# Patient Record
Sex: Female | Born: 1950
Health system: Southern US, Community
[De-identification: ages and names within clinical notes are randomized; demographics above are authoritative.]

## PROBLEM LIST (undated history)

## (undated) DIAGNOSIS — E785 Hyperlipidemia, unspecified: Secondary | ICD-10-CM

## (undated) DIAGNOSIS — I10 Essential (primary) hypertension: Secondary | ICD-10-CM

## (undated) DIAGNOSIS — E538 Deficiency of other specified B group vitamins: Secondary | ICD-10-CM

## (undated) DIAGNOSIS — F329 Major depressive disorder, single episode, unspecified: Secondary | ICD-10-CM

## (undated) DIAGNOSIS — Z9289 Personal history of other medical treatment: Secondary | ICD-10-CM

## (undated) DIAGNOSIS — E669 Obesity, unspecified: Secondary | ICD-10-CM

## (undated) DIAGNOSIS — F419 Anxiety disorder, unspecified: Secondary | ICD-10-CM

## (undated) DIAGNOSIS — D649 Anemia, unspecified: Secondary | ICD-10-CM

## (undated) DIAGNOSIS — K5790 Diverticulosis of intestine, part unspecified, without perforation or abscess without bleeding: Secondary | ICD-10-CM

## (undated) DIAGNOSIS — K625 Hemorrhage of anus and rectum: Secondary | ICD-10-CM

## (undated) DIAGNOSIS — R011 Cardiac murmur, unspecified: Secondary | ICD-10-CM

## (undated) DIAGNOSIS — F32A Depression, unspecified: Secondary | ICD-10-CM

## (undated) DIAGNOSIS — M1990 Unspecified osteoarthritis, unspecified site: Secondary | ICD-10-CM

## (undated) DIAGNOSIS — T7840XA Allergy, unspecified, initial encounter: Secondary | ICD-10-CM

## (undated) DIAGNOSIS — K219 Gastro-esophageal reflux disease without esophagitis: Secondary | ICD-10-CM

## (undated) DIAGNOSIS — E559 Vitamin D deficiency, unspecified: Secondary | ICD-10-CM

## (undated) DIAGNOSIS — R42 Dizziness and giddiness: Secondary | ICD-10-CM

## (undated) DIAGNOSIS — J3089 Other allergic rhinitis: Secondary | ICD-10-CM

## (undated) HISTORY — DX: Obesity, unspecified: E66.9

## (undated) HISTORY — DX: Hemorrhage of anus and rectum: K62.5

## (undated) HISTORY — DX: Essential (primary) hypertension: I10

## (undated) HISTORY — DX: Allergy, unspecified, initial encounter: T78.40XA

## (undated) HISTORY — DX: Deficiency of other specified B group vitamins: E53.8

## (undated) HISTORY — DX: Cardiac murmur, unspecified: R01.1

## (undated) HISTORY — PX: ABDOMINAL HYSTERECTOMY: SHX81

## (undated) HISTORY — DX: Dizziness and giddiness: R42

## (undated) HISTORY — DX: Personal history of other medical treatment: Z92.89

## (undated) HISTORY — DX: Anemia, unspecified: D64.9

## (undated) HISTORY — DX: Gastro-esophageal reflux disease without esophagitis: K21.9

## (undated) HISTORY — DX: Depression, unspecified: F32.A

## (undated) HISTORY — DX: Anxiety disorder, unspecified: F41.9

## (undated) HISTORY — DX: Vitamin D deficiency, unspecified: E55.9

## (undated) HISTORY — DX: Diverticulosis of intestine, part unspecified, without perforation or abscess without bleeding: K57.90

## (undated) HISTORY — DX: Other allergic rhinitis: J30.89

## (undated) HISTORY — DX: Hyperlipidemia, unspecified: E78.5

## (undated) HISTORY — DX: Unspecified osteoarthritis, unspecified site: M19.90

## (undated) HISTORY — DX: Major depressive disorder, single episode, unspecified: F32.9

## (undated) HISTORY — PX: NO PAST SURGERIES: SHX2092

---

## 1998-02-05 ENCOUNTER — Encounter: Admission: RE | Admit: 1998-02-05 | Discharge: 1998-02-05 | Payer: Self-pay | Admitting: Family Medicine

## 1998-03-06 ENCOUNTER — Encounter: Admission: RE | Admit: 1998-03-06 | Discharge: 1998-03-06 | Payer: Self-pay | Admitting: Family Medicine

## 1998-03-18 ENCOUNTER — Encounter: Admission: RE | Admit: 1998-03-18 | Discharge: 1998-03-18 | Payer: Self-pay | Admitting: Family Medicine

## 1998-04-01 ENCOUNTER — Encounter: Admission: RE | Admit: 1998-04-01 | Discharge: 1998-04-01 | Payer: Self-pay | Admitting: Sports Medicine

## 1998-04-15 ENCOUNTER — Encounter: Admission: RE | Admit: 1998-04-15 | Discharge: 1998-04-15 | Payer: Self-pay | Admitting: Family Medicine

## 1998-08-08 ENCOUNTER — Encounter: Admission: RE | Admit: 1998-08-08 | Discharge: 1998-08-08 | Payer: Self-pay | Admitting: Family Medicine

## 1999-01-22 ENCOUNTER — Encounter: Admission: RE | Admit: 1999-01-22 | Discharge: 1999-01-22 | Payer: Self-pay | Admitting: Family Medicine

## 1999-02-20 ENCOUNTER — Encounter: Admission: RE | Admit: 1999-02-20 | Discharge: 1999-02-20 | Payer: Self-pay | Admitting: Family Medicine

## 1999-04-27 ENCOUNTER — Encounter: Payer: Self-pay | Admitting: Sports Medicine

## 1999-04-27 ENCOUNTER — Encounter: Admission: RE | Admit: 1999-04-27 | Discharge: 1999-04-27 | Payer: Self-pay | Admitting: Sports Medicine

## 1999-04-30 ENCOUNTER — Encounter: Admission: RE | Admit: 1999-04-30 | Discharge: 1999-04-30 | Payer: Self-pay | Admitting: Sports Medicine

## 1999-04-30 ENCOUNTER — Encounter: Payer: Self-pay | Admitting: Sports Medicine

## 1999-06-02 ENCOUNTER — Encounter: Admission: RE | Admit: 1999-06-02 | Discharge: 1999-06-02 | Payer: Self-pay | Admitting: Family Medicine

## 1999-10-08 ENCOUNTER — Encounter: Admission: RE | Admit: 1999-10-08 | Discharge: 1999-10-08 | Payer: Self-pay | Admitting: Family Medicine

## 2000-02-18 ENCOUNTER — Encounter: Admission: RE | Admit: 2000-02-18 | Discharge: 2000-02-18 | Payer: Self-pay

## 2000-03-23 ENCOUNTER — Encounter: Admission: RE | Admit: 2000-03-23 | Discharge: 2000-03-23 | Payer: Self-pay | Admitting: Family Medicine

## 2000-04-05 ENCOUNTER — Encounter: Admission: RE | Admit: 2000-04-05 | Discharge: 2000-04-05 | Payer: Self-pay | Admitting: Family Medicine

## 2000-05-31 ENCOUNTER — Encounter: Admission: RE | Admit: 2000-05-31 | Discharge: 2000-05-31 | Payer: Self-pay | Admitting: Sports Medicine

## 2000-05-31 ENCOUNTER — Encounter: Payer: Self-pay | Admitting: Sports Medicine

## 2000-08-08 ENCOUNTER — Encounter: Admission: RE | Admit: 2000-08-08 | Discharge: 2000-08-08 | Payer: Self-pay | Admitting: Family Medicine

## 2001-03-20 ENCOUNTER — Encounter: Admission: RE | Admit: 2001-03-20 | Discharge: 2001-03-20 | Payer: Self-pay | Admitting: Family Medicine

## 2001-06-26 ENCOUNTER — Encounter: Admission: RE | Admit: 2001-06-26 | Discharge: 2001-06-26 | Payer: Self-pay | Admitting: Family Medicine

## 2001-08-09 ENCOUNTER — Encounter: Admission: RE | Admit: 2001-08-09 | Discharge: 2001-08-09 | Payer: Self-pay | Admitting: Sports Medicine

## 2001-08-09 ENCOUNTER — Encounter: Payer: Self-pay | Admitting: Sports Medicine

## 2001-08-18 ENCOUNTER — Encounter (INDEPENDENT_AMBULATORY_CARE_PROVIDER_SITE_OTHER): Payer: Self-pay | Admitting: *Deleted

## 2001-08-18 LAB — CONVERTED CEMR LAB

## 2001-09-08 ENCOUNTER — Encounter: Admission: RE | Admit: 2001-09-08 | Discharge: 2001-09-08 | Payer: Self-pay | Admitting: Family Medicine

## 2002-03-23 ENCOUNTER — Encounter: Admission: RE | Admit: 2002-03-23 | Discharge: 2002-03-23 | Payer: Self-pay | Admitting: Family Medicine

## 2002-08-17 ENCOUNTER — Encounter: Admission: RE | Admit: 2002-08-17 | Discharge: 2002-08-17 | Payer: Self-pay | Admitting: Family Medicine

## 2002-12-05 ENCOUNTER — Encounter: Admission: RE | Admit: 2002-12-05 | Discharge: 2002-12-05 | Payer: Self-pay | Admitting: Family Medicine

## 2003-01-10 ENCOUNTER — Encounter: Admission: RE | Admit: 2003-01-10 | Discharge: 2003-01-10 | Payer: Self-pay | Admitting: Family Medicine

## 2003-01-16 ENCOUNTER — Encounter: Admission: RE | Admit: 2003-01-16 | Discharge: 2003-01-16 | Payer: Self-pay | Admitting: Sports Medicine

## 2003-05-29 ENCOUNTER — Encounter: Admission: RE | Admit: 2003-05-29 | Discharge: 2003-05-29 | Payer: Self-pay | Admitting: Family Medicine

## 2003-05-30 ENCOUNTER — Encounter: Admission: RE | Admit: 2003-05-30 | Discharge: 2003-05-30 | Payer: Self-pay | Admitting: Sports Medicine

## 2003-08-27 ENCOUNTER — Encounter: Admission: RE | Admit: 2003-08-27 | Discharge: 2003-08-27 | Payer: Self-pay | Admitting: Family Medicine

## 2003-10-22 ENCOUNTER — Ambulatory Visit: Payer: Self-pay | Admitting: Sports Medicine

## 2003-11-19 ENCOUNTER — Ambulatory Visit: Payer: Self-pay | Admitting: Sports Medicine

## 2003-12-24 ENCOUNTER — Ambulatory Visit: Payer: Self-pay | Admitting: Sports Medicine

## 2004-01-29 ENCOUNTER — Emergency Department (HOSPITAL_COMMUNITY): Admission: EM | Admit: 2004-01-29 | Discharge: 2004-01-29 | Payer: Self-pay | Admitting: Family Medicine

## 2004-03-17 ENCOUNTER — Encounter: Admission: RE | Admit: 2004-03-17 | Discharge: 2004-03-17 | Payer: Self-pay | Admitting: Sports Medicine

## 2004-04-17 ENCOUNTER — Ambulatory Visit: Payer: Self-pay | Admitting: Family Medicine

## 2004-04-27 ENCOUNTER — Ambulatory Visit: Payer: Self-pay | Admitting: Family Medicine

## 2004-05-15 ENCOUNTER — Ambulatory Visit: Payer: Self-pay | Admitting: Family Medicine

## 2004-10-12 ENCOUNTER — Ambulatory Visit: Payer: Self-pay | Admitting: Sports Medicine

## 2004-11-11 ENCOUNTER — Ambulatory Visit: Payer: Self-pay | Admitting: Family Medicine

## 2005-03-19 ENCOUNTER — Ambulatory Visit: Payer: Self-pay | Admitting: Family Medicine

## 2005-04-09 ENCOUNTER — Encounter: Admission: RE | Admit: 2005-04-09 | Discharge: 2005-04-09 | Payer: Self-pay | Admitting: Sports Medicine

## 2005-04-21 ENCOUNTER — Ambulatory Visit: Payer: Self-pay | Admitting: Family Medicine

## 2005-05-07 ENCOUNTER — Ambulatory Visit: Payer: Self-pay | Admitting: Family Medicine

## 2005-05-27 ENCOUNTER — Ambulatory Visit: Payer: Self-pay | Admitting: Family Medicine

## 2005-07-06 ENCOUNTER — Ambulatory Visit: Payer: Self-pay | Admitting: Sports Medicine

## 2005-11-26 ENCOUNTER — Ambulatory Visit: Payer: Self-pay | Admitting: Family Medicine

## 2006-01-27 ENCOUNTER — Ambulatory Visit: Payer: Self-pay | Admitting: Family Medicine

## 2006-02-01 ENCOUNTER — Ambulatory Visit: Payer: Self-pay | Admitting: Sports Medicine

## 2006-03-17 DIAGNOSIS — K219 Gastro-esophageal reflux disease without esophagitis: Secondary | ICD-10-CM | POA: Insufficient documentation

## 2006-03-17 DIAGNOSIS — E785 Hyperlipidemia, unspecified: Secondary | ICD-10-CM | POA: Insufficient documentation

## 2006-03-18 ENCOUNTER — Encounter (INDEPENDENT_AMBULATORY_CARE_PROVIDER_SITE_OTHER): Payer: Self-pay | Admitting: *Deleted

## 2006-04-28 ENCOUNTER — Ambulatory Visit: Payer: Self-pay | Admitting: Family Medicine

## 2006-05-09 ENCOUNTER — Encounter (INDEPENDENT_AMBULATORY_CARE_PROVIDER_SITE_OTHER): Payer: Self-pay | Admitting: Family Medicine

## 2006-05-09 ENCOUNTER — Encounter: Admission: RE | Admit: 2006-05-09 | Discharge: 2006-05-09 | Payer: Self-pay | Admitting: Sports Medicine

## 2006-05-26 ENCOUNTER — Ambulatory Visit: Payer: Self-pay | Admitting: Family Medicine

## 2006-05-26 ENCOUNTER — Encounter (INDEPENDENT_AMBULATORY_CARE_PROVIDER_SITE_OTHER): Payer: Self-pay | Admitting: Family Medicine

## 2006-05-26 DIAGNOSIS — K649 Unspecified hemorrhoids: Secondary | ICD-10-CM | POA: Insufficient documentation

## 2006-05-26 DIAGNOSIS — F32A Depression, unspecified: Secondary | ICD-10-CM | POA: Insufficient documentation

## 2006-05-26 DIAGNOSIS — E669 Obesity, unspecified: Secondary | ICD-10-CM | POA: Insufficient documentation

## 2006-05-26 DIAGNOSIS — F329 Major depressive disorder, single episode, unspecified: Secondary | ICD-10-CM

## 2006-05-26 DIAGNOSIS — I10 Essential (primary) hypertension: Secondary | ICD-10-CM | POA: Insufficient documentation

## 2006-05-26 DIAGNOSIS — K5732 Diverticulitis of large intestine without perforation or abscess without bleeding: Secondary | ICD-10-CM | POA: Insufficient documentation

## 2006-05-26 DIAGNOSIS — F419 Anxiety disorder, unspecified: Secondary | ICD-10-CM

## 2006-05-31 ENCOUNTER — Encounter (INDEPENDENT_AMBULATORY_CARE_PROVIDER_SITE_OTHER): Payer: Self-pay | Admitting: Family Medicine

## 2006-05-31 ENCOUNTER — Ambulatory Visit: Payer: Self-pay | Admitting: Family Medicine

## 2006-05-31 LAB — CONVERTED CEMR LAB
ALT: 21 units/L (ref 0–35)
AST: 20 units/L (ref 0–37)
Albumin: 4.1 g/dL (ref 3.5–5.2)
Alkaline Phosphatase: 96 units/L (ref 39–117)
BUN: 14 mg/dL (ref 6–23)
CO2: 29 meq/L (ref 19–32)
Calcium: 9.6 mg/dL (ref 8.4–10.5)
Chloride: 99 meq/L (ref 96–112)
Cholesterol: 223 mg/dL — ABNORMAL HIGH (ref 0–200)
Creatinine, Ser: 0.76 mg/dL (ref 0.40–1.20)
Glucose, Bld: 108 mg/dL — ABNORMAL HIGH (ref 70–99)
HDL: 53 mg/dL (ref >39)
LDL Cholesterol: 151 mg/dL — ABNORMAL HIGH (ref 0–99)
Potassium: 4.1 meq/L (ref 3.5–5.3)
Sodium: 138 meq/L (ref 135–145)
TSH: 1.041 microintl units/mL (ref 0.350–5.50)
Total Bilirubin: 0.4 mg/dL (ref 0.3–1.2)
Total CHOL/HDL Ratio: 4.2
Total Protein: 7.6 g/dL (ref 6.0–8.3)
Triglycerides: 96 mg/dL (ref <150)
VLDL: 19 mg/dL (ref 0–40)

## 2006-06-01 ENCOUNTER — Encounter (INDEPENDENT_AMBULATORY_CARE_PROVIDER_SITE_OTHER): Payer: Self-pay | Admitting: Family Medicine

## 2007-01-06 ENCOUNTER — Ambulatory Visit: Payer: Self-pay | Admitting: Family Medicine

## 2007-05-15 ENCOUNTER — Ambulatory Visit: Payer: Self-pay | Admitting: Family Medicine

## 2007-12-19 ENCOUNTER — Telehealth (INDEPENDENT_AMBULATORY_CARE_PROVIDER_SITE_OTHER): Payer: Self-pay | Admitting: *Deleted

## 2007-12-20 ENCOUNTER — Encounter (INDEPENDENT_AMBULATORY_CARE_PROVIDER_SITE_OTHER): Payer: Self-pay | Admitting: Family Medicine

## 2007-12-20 ENCOUNTER — Ambulatory Visit: Payer: Self-pay | Admitting: Family Medicine

## 2007-12-20 LAB — CONVERTED CEMR LAB
ALT: 11 units/L (ref 0–35)
AST: 15 units/L (ref 0–37)
Albumin: 4.3 g/dL (ref 3.5–5.2)
Alkaline Phosphatase: 93 units/L (ref 39–117)
BUN: 14 mg/dL (ref 6–23)
CO2: 28 meq/L (ref 19–32)
Calcium: 9.8 mg/dL (ref 8.4–10.5)
Chloride: 96 meq/L (ref 96–112)
Cholesterol: 219 mg/dL — ABNORMAL HIGH (ref 0–200)
Creatinine, Ser: 0.86 mg/dL (ref 0.40–1.20)
Glucose, Bld: 127 mg/dL — ABNORMAL HIGH (ref 70–99)
HDL: 56 mg/dL (ref >39)
LDL Cholesterol: 143 mg/dL — ABNORMAL HIGH (ref 0–99)
Potassium: 3.5 meq/L (ref 3.5–5.3)
Sodium: 136 meq/L (ref 135–145)
Total Bilirubin: 0.3 mg/dL (ref 0.3–1.2)
Total CHOL/HDL Ratio: 3.9
Total Protein: 8.1 g/dL (ref 6.0–8.3)
Triglycerides: 101 mg/dL (ref <150)
VLDL: 20 mg/dL (ref 0–40)

## 2007-12-26 ENCOUNTER — Encounter (INDEPENDENT_AMBULATORY_CARE_PROVIDER_SITE_OTHER): Payer: Self-pay | Admitting: Family Medicine

## 2008-07-17 ENCOUNTER — Telehealth: Payer: Self-pay | Admitting: Family Medicine

## 2008-07-29 ENCOUNTER — Encounter: Payer: Self-pay | Admitting: Family Medicine

## 2008-08-08 ENCOUNTER — Ambulatory Visit: Payer: Self-pay | Admitting: Family Medicine

## 2008-09-24 ENCOUNTER — Telehealth: Payer: Self-pay | Admitting: Family Medicine

## 2008-09-25 ENCOUNTER — Telehealth: Payer: Self-pay | Admitting: Family Medicine

## 2008-10-23 ENCOUNTER — Ambulatory Visit (HOSPITAL_COMMUNITY): Admission: RE | Admit: 2008-10-23 | Discharge: 2008-10-23 | Payer: Self-pay | Admitting: Family Medicine

## 2009-03-03 ENCOUNTER — Telehealth: Payer: Self-pay | Admitting: Family Medicine

## 2009-03-24 ENCOUNTER — Ambulatory Visit: Payer: Self-pay | Admitting: Family Medicine

## 2009-03-24 DIAGNOSIS — M1990 Unspecified osteoarthritis, unspecified site: Secondary | ICD-10-CM | POA: Insufficient documentation

## 2009-04-17 ENCOUNTER — Ambulatory Visit: Payer: Self-pay | Admitting: Family Medicine

## 2009-04-17 ENCOUNTER — Encounter: Payer: Self-pay | Admitting: Family Medicine

## 2009-04-17 LAB — CONVERTED CEMR LAB
ALT: 12 units/L (ref 0–35)
AST: 15 units/L (ref 0–37)
Albumin: 4 g/dL (ref 3.5–5.2)
Alkaline Phosphatase: 87 units/L (ref 39–117)
BUN: 15 mg/dL (ref 6–23)
CO2: 27 meq/L (ref 19–32)
CRP: 1.1 mg/dL — ABNORMAL HIGH (ref <0.6)
Calcium: 9.4 mg/dL (ref 8.4–10.5)
Chloride: 99 meq/L (ref 96–112)
Cholesterol: 224 mg/dL — ABNORMAL HIGH (ref 0–200)
Creatinine, Ser: 0.89 mg/dL (ref 0.40–1.20)
Glucose, Bld: 107 mg/dL — ABNORMAL HIGH (ref 70–99)
HDL: 38 mg/dL — ABNORMAL LOW (ref >39)
LDL Cholesterol: 161 mg/dL — ABNORMAL HIGH (ref 0–99)
Potassium: 3.9 meq/L (ref 3.5–5.3)
Rhuematoid fact SerPl-aCnc: 28 intl units/mL — ABNORMAL HIGH (ref 0–20)
Sodium: 137 meq/L (ref 135–145)
Total Bilirubin: 0.3 mg/dL (ref 0.3–1.2)
Total CHOL/HDL Ratio: 5.9
Total Protein: 7.6 g/dL (ref 6.0–8.3)
Triglycerides: 124 mg/dL (ref <150)
VLDL: 25 mg/dL (ref 0–40)
Vit D, 25-Hydroxy: <10 ng/mL — ABNORMAL LOW (ref 30–89)

## 2009-05-05 ENCOUNTER — Encounter: Payer: Self-pay | Admitting: Family Medicine

## 2009-05-05 ENCOUNTER — Telehealth: Payer: Self-pay | Admitting: *Deleted

## 2009-05-08 ENCOUNTER — Ambulatory Visit (HOSPITAL_COMMUNITY): Admission: RE | Admit: 2009-05-08 | Discharge: 2009-05-08 | Payer: Self-pay | Admitting: Family Medicine

## 2009-05-13 ENCOUNTER — Ambulatory Visit: Payer: Self-pay | Admitting: Family Medicine

## 2009-05-13 ENCOUNTER — Encounter: Payer: Self-pay | Admitting: Family Medicine

## 2009-05-13 LAB — CONVERTED CEMR LAB
Cyclic Citrullin Peptide Ab: <2 units (ref 0–5)
HCT: 35 % — ABNORMAL LOW (ref 36.0–46.0)
Hemoglobin: 10.7 g/dL — ABNORMAL LOW (ref 12.0–15.0)
MCHC: 30.6 g/dL (ref 30.0–36.0)
MCV: 86.6 fL (ref 78.0–100.0)
Platelets: 322 10*3/uL (ref 150–400)
RBC: 4.04 M/uL (ref 3.87–5.11)
RDW: 14.6 % (ref 11.5–15.5)
WBC: 6.2 10*3/uL (ref 4.0–10.5)

## 2009-05-20 ENCOUNTER — Encounter: Payer: Self-pay | Admitting: Family Medicine

## 2009-05-20 ENCOUNTER — Ambulatory Visit: Payer: Self-pay | Admitting: Family Medicine

## 2009-05-20 DIAGNOSIS — D510 Vitamin B12 deficiency anemia due to intrinsic factor deficiency: Secondary | ICD-10-CM | POA: Insufficient documentation

## 2009-05-20 LAB — CONVERTED CEMR LAB: Hgb A1c MFr Bld: 6.3 %

## 2009-10-27 ENCOUNTER — Ambulatory Visit: Payer: Self-pay | Admitting: Family Medicine

## 2009-10-27 ENCOUNTER — Encounter: Payer: Self-pay | Admitting: Psychology

## 2009-10-27 DIAGNOSIS — J309 Allergic rhinitis, unspecified: Secondary | ICD-10-CM | POA: Insufficient documentation

## 2009-11-10 ENCOUNTER — Ambulatory Visit: Payer: Self-pay | Admitting: Psychology

## 2009-11-26 ENCOUNTER — Ambulatory Visit: Payer: Self-pay | Admitting: Psychology

## 2009-12-26 ENCOUNTER — Telehealth: Payer: Self-pay | Admitting: Family Medicine

## 2010-02-17 NOTE — Assessment & Plan Note (Signed)
Summary: Behavioral Medicine Student Consultation   Primary Care Provider:  Helane Rima DO   History of Present Illness: Ms. Forbush has a history of depression. She reported increased stressors over the last several weeks. Specifically, Ms. Holtrop reported that she has been unable to find a job and has become hopeless. Her PCP, Dr. Earlene Plater, requested student behavioral medicine consultaton to discuss therapy options.   Allergies: No Known Drug Allergies   Impression & Recommendations:  Problem # 1:  DEPRESSION (ICD-311) Ms. Boorman is interested in seeing a psychologist to discuss her recent stressors and feelings of hopelessness. Discussed options for therapy with Ms. Goren including seeing Dr. Pascal Lux, the [facility], and the [facility] [place] site for searching for providers in the [place] area. Ms. Bahri was concerned about financial commitments in regard to therapy.  Ms. Halabi may benefit from continued follow-up about therapy. Specifically, she may benefit form reinforcement for attending therapy or dicsussion of the barriers that prevented her from contating a psychologist.   Delphia Grates  Behavioral Medicine Student  October 27, 2009 9:45 AM   Her updated medication list for this problem includes:    Paxil 20 Mg Tabs (Paroxetine hcl) .Marland Kitchen... Take 2 by mouth daily  Complete Medication List: 1)  Lisinopril 20 Mg Tabs (Lisinopril) .Marland Kitchen.. 1 tablet by mouth daily for blood pressure 2)  Hydrochlorothiazide 25 Mg Tabs (Hydrochlorothiazide) .Marland Kitchen.. 1 tablet by mouth daily 3)  Paxil 20 Mg Tabs (Paroxetine hcl) .... Take 2 by mouth daily 4)  Simvastatin 40 Mg Tabs (Simvastatin) .... Take one tablet at bedtime 5)  Aleve 220 Mg Caps (Naproxen sodium) .... One by mouth two times a day three times a day as needed pain 6)  Hydrocodone-acetaminophen 5-325 Mg Tabs (Hydrocodone-acetaminophen) .Marland Kitchen.. 1 by mouth up to 4 times per day as needed for severe pain 7)  Ferrous  Sulfate 325 (65 Fe) Mg Tbec (Ferrous sulfate) .... Two times a day 8)  Vitamin D (ergocalciferol) 50000 Unit Caps (Ergocalciferol) .... One by mouth weekly x 12 weeks 9)  Zyrtec Allergy 10 Mg Caps (Cetirizine hcl) .... One by mouth daily 10)  Fluticasone Propionate 50 Mcg/act Susp (Fluticasone propionate) .... 2 sprays each nostril once daily  Appended Document: Behavioral Medicine Student Consultation Ms. Burrows called to schedule an appt.  She preferred morning.  We scheduled for the first available:  October 24th at 11:00.

## 2010-02-17 NOTE — Miscellaneous (Signed)
Summary: waiting for pt to call back/ts   Clinical Lists Changes  Orders: Added new Test order of [facility] (69485) - Signed Added new Test order of [facility] [id number]) - Signed  Please call this patient and ask her to:  1. Get the xrays that I ordered. 2. Come in for above labs. 3. Make an appointment to discuss these.  Helane Rima DO  May 05, 2009 11:23 AM   CALLED PT AND LMAM TO CALL BACK.Arlyss Repress CMA,  May 05, 2009 11:28 AM

## 2010-02-17 NOTE — Assessment & Plan Note (Signed)
Summary: med check/dlh   Vital Signs:  Patient profile:   60 year old female Height:      64.75 inches Weight:      219 pounds BMI:     36.86 Pulse rate:   100 / minute BP sitting:   130 / 80  (right arm) Cuff size:   large  Vitals Entered By: Arlyss Repress CMA, (August 08, 2008 2:54 PM) CC: f/up on HTN and refill meds. pt has not been working x 2 years. some anxiety. Is Patient Diabetic? No Pain Assessment Patient in pain? no        Primary Care Provider:  Helane Rima DO  CC:  f/up on HTN and refill meds. pt has not been working x 2 years. some anxiety.Marland Kitchen  History of Present Illness: Jasmine Gregory is a 60 year old lady presenting for refill on medications, HTN, and depression/anxiety.  1. HTN: Rx lisinopril/HCTZ. she denies CP, SOB, palpitations, N/V/D, HA, dizziness.  2. Depression/Anxiety: was recently started on Paxil 20 mg daily. still without job, on [employer].  finances are tight.  she is bored at home all day with nothing to do.  divorced.  has sister and dtr she talks with frequently for support. no SI/HI.  Habits & Providers  Alcohol-Tobacco-Diet     Tobacco Status: never  Current Medications (verified): 1)  Lisinopril 20 Mg Tabs (Lisinopril) .Marland Kitchen.. 1 Tablet By Mouth Daily For Blood Pressure 2)  Hydrochlorothiazide 25 Mg  Tabs (Hydrochlorothiazide) .Marland Kitchen.. 1 Tablet By Mouth Daily 3)  Paxil 40 Mg Tabs (Paroxetine Hcl) .... Take One By Mouth Daily 4)  Pravastatin Sodium 40 Mg  Tabs (Pravastatin Sodium) .Marland Kitchen.. 1 Tablet By Mouth At Bedtime For Cholesterol  Allergies (verified): No Known Drug Allergies PMH-FH-SH reviewed-no changes except otherwise noted  Review of Systems       per HPI, otherwise negative  Physical Exam  General:  alert, well-developed, well-nourished, and well-hydrated.   Lungs:  Normal respiratory effort, chest expands symmetrically. Lungs are clear to auscultation, no crackles or wheezes. Heart:  Normal rate and regular rhythm. S1 and S2  normal without gallop, murmur, click, rub or other extra sounds. Psych:  Normally interactive and good eye contact.  Tearful.    Impression & Recommendations:  Problem # 1:  HYPERTENSION, BENIGN ESSENTIAL (ICD-401.1) Assessment Improved  Continue current treatment. Her updated medication list for this problem includes:    Lisinopril 20 Mg Tabs (Lisinopril) .Marland Kitchen... 1 tablet by mouth daily for blood pressure    Hydrochlorothiazide 25 Mg Tabs (Hydrochlorothiazide) .Marland Kitchen... 1 tablet by mouth daily  Orders: [facility]- Est Level  3 (16109)  Problem # 2:  DEPRESSION (ICD-311) Assessment: Unchanged  Will increase Paxil today. Given RED FLAGS for seeking medical care. Follow up one month. Her updated medication list for this problem includes:    Paxil 40 Mg Tabs (Paroxetine hcl) .Marland Kitchen... Take one by mouth daily  Orders: [facility]- Est Level  3 (60454)  Complete Medication List: 1)  Lisinopril 20 Mg Tabs (Lisinopril) .Marland Kitchen.. 1 tablet by mouth daily for blood pressure 2)  Hydrochlorothiazide 25 Mg Tabs (Hydrochlorothiazide) .Marland Kitchen.. 1 tablet by mouth daily 3)  Paxil 40 Mg Tabs (Paroxetine hcl) .... Take one by mouth daily 4)  Pravastatin Sodium 40 Mg Tabs (Pravastatin sodium) .Marland Kitchen.. 1 tablet by mouth at bedtime for cholesterol  Patient Instructions: 1)  It was very nice to meet you today!  2)  I am increasing your Paxil. Continue to use your 20 mg tabs until they  run out: take 1 and 1/2 tabs daily x 1 week, then use your new prescription of 40 mg tabs. 3)  Let me know if you have any problems or questions. Prescriptions: PRAVASTATIN SODIUM 40 MG  TABS (PRAVASTATIN SODIUM) 1 tablet by mouth at bedtime for cholesterol  #90 x 1   Entered and Authorized by:   Helane Rima MD   Signed by:   Helane Rima MD on 08/08/2008   Method used:   Electronically to        [facility] [phone]* (retail)       [address]       [place], [place]  [address]       Ph: [phone]       Fax: [phone]   RxID:    [id number] PAXIL 40 MG TABS (PAROXETINE HCL) take one by mouth daily  #90 x 3   Entered and Authorized by:   Helane Rima MD   Signed by:   Helane Rima MD on 08/08/2008   Method used:   Electronically to        [facility] [phone]* (retail)       [address]       [place], [place]  [address]       Ph: [phone]       Fax: [phone]   RxID:   [id number] HYDROCHLOROTHIAZIDE 25 MG  TABS (HYDROCHLOROTHIAZIDE) 1 tablet by mouth daily  #90 x 3   Entered and Authorized by:   Helane Rima MD   Signed by:   Helane Rima MD on 08/08/2008   Method used:   Electronically to        [facility] [phone]* (retail)       [address]       [place], [place]  [address]       Ph: [phone]       Fax: [phone]   RxID:   [id number] LISINOPRIL 20 MG TABS (LISINOPRIL) 1 tablet by mouth daily for blood pressure  #90 x 3   Entered and Authorized by:   Helane Rima MD   Signed by:   Helane Rima MD on 08/08/2008   Method used:   Electronically to        [facility] [phone]* (retail)       [address]       [place], [place]  [address]       Ph: [phone]       Fax: [phone]   RxID:   [id number] PAXIL 40 MG TABS (PAROXETINE HCL) take one by mouth daily  #30 x 3   Entered and Authorized by:   Helane Rima MD   Signed by:   Helane Rima MD on 08/08/2008   Method used:   Electronically to        [facility] [phone]* (retail)       [address]       [place], [place]  [address]       Ph: [phone]       Fax: [phone]   RxID:   [id number]

## 2010-02-17 NOTE — Assessment & Plan Note (Signed)
Summary: bp medication   Medications Added LISINOPRIL 10 MG  TABS (LISINOPRIL) 1 tablet by mouth daily HYDROCHLOROTHIAZIDE 25 MG  TABS (HYDROCHLOROTHIAZIDE) 1 tablet by mouth daily PAXIL 20 MG  TABS (PAROXETINE HCL) 1 tablet by mouth daily PRAVASTATIN SODIUM 40 MG  TABS (PRAVASTATIN SODIUM) 1 tablet by mouth at bedtime for cholesterol        Vital Signs:  Patient Profile:   60 Years Old Female Height:     64.75 inches Weight:      216.5 pounds Temp:     98.5 degrees F Pulse rate:   95 / minute BP sitting:   131 / 84  (right arm)  Pt. in pain?   no  Vitals Entered By: Theresia Lo RN (January 06, 2007 3:59 PM)              Is Patient Diabetic? No     Chief Complaint:  follow up on blood pressure and pt complains with pain in Rt foot intermittently.  History of Present Illness: 60 yo with HTN, HLD.  Here for routine f/u.  1.  Also complains of pain on the top part of her right foot x 1 month after tripping and twisting her ankle.  pain is mild and she is taking aleve as needed.  2.  also complains 1 year history of periodic episodes of feeling flush, overwhelmed with anxiety, almost like she is going to pass out.  Goes away after 5 minutes if she goes and sits down.  Not sure if she is having panic attacks.  Denies associated palpitations, sob, numbness/tingling, weakness.    3.  HTN- taking hctz and enalapril without side effects.  Denies CP, sob, palpitations.  4.  HLD- ran out of statin over 1 month ago, needs refill  5.  depression- taking paxil 20mg  every day now.  Was taking it every other day until she lost her job in May.  She has been really bored and anxious lately.  Still looking for a job and currently drawing unemployment.      Past Medical History:    fasting glucose 104 5/05    fasting glucose 108 5/08   Social History:    worked for [employer], layed off 05/2006.  Currently unemployed.    no tob, no EtOH    lives alone; adult dtr  temporarily stays with her    enjoys yard Airline pilot, movies, walking    has 1 dtr, 1 son (both grown)     Physical Exam  General:     NAD, pleasant Lungs:     Normal respiratory effort, chest expands symmetrically. Lungs are clear to auscultation, no crackles or wheezes. Heart:     Normal rate and regular rhythm. S1 and S2 normal without gallop, murmur, click, rub or other extra sounds. Msk:     no deformity on right foot, mildely ttp over dorsal aspect of foot, strength normal, normal gait Extremities:     no edema    Impression & Recommendations:  Problem # 1:  HYPERTENSION, BENIGN ESSENTIAL (ICD-401.1) well-controlled.  refill current meds Her updated medication list for this problem includes:    Lisinopril 10 Mg Tabs (Lisinopril) .Marland Kitchen... 1 tablet by mouth daily    Hydrochlorothiazide 25 Mg Tabs (Hydrochlorothiazide) .Marland Kitchen... 1 tablet by mouth daily   Problem # 2:  HYPERLIPIDEMIA (ICD-272.4) resume statin.  recheck FLP in 3 months. Her updated medication list for this problem includes:    Pravastatin Sodium 40 Mg Tabs (  Pravastatin sodium) .Marland Kitchen... 1 tablet by mouth at bedtime for cholesterol   Problem # 3:  DEPRESSION (ICD-311) ? whether she is having panic attacks.  Offered to increase her paxil, but she just recenlty started taking it on a daily basis and wants to see if this helps first.  Differential for these episodes of flushing and feeling faint also includes arrhythmia.  Offered holter monitoring but pt declined since she currently has no health insurance. Her updated medication list for this problem includes:    Paxil 20 Mg Tabs (Paroxetine hcl) .Marland Kitchen... 1 tablet by mouth daily   Problem # 4:  FOOT PAIN, RIGHT (ICD-729.5) normal exam.  aleve as needed.  Complete Medication List: 1)  Lisinopril 10 Mg Tabs (Lisinopril) .Marland Kitchen.. 1 tablet by mouth daily 2)  Hydrochlorothiazide 25 Mg Tabs (Hydrochlorothiazide) .Marland Kitchen.. 1 tablet by mouth daily 3)  Paxil 20 Mg Tabs (Paroxetine hcl)  .Marland Kitchen.. 1 tablet by mouth daily 4)  Pravastatin Sodium 40 Mg Tabs (Pravastatin sodium) .Marland Kitchen.. 1 tablet by mouth at bedtime for cholesterol     Prescriptions: PRAVASTATIN SODIUM 40 MG  TABS (PRAVASTATIN SODIUM) 1 tablet by mouth at bedtime for cholesterol  #90 x 2   Entered and Authorized by:   Sylvan Cheese MD   Signed by:   Sylvan Cheese MD on 01/06/2007   Method used:   Electronically sent to ...       [address]       [address]       [place], [place]  [address]       Ph: [phone]       Fax: [phone]   RxID:   [id number] PAXIL 20 MG  TABS (PAROXETINE HCL) 1 tablet by mouth daily  #90 x 2   Entered and Authorized by:   Sylvan Cheese MD   Signed by:   Sylvan Cheese MD on 01/06/2007   Method used:   Electronically sent to ...       [address]       [address]       [place], [place]  [address]       Ph: [phone]       Fax: [phone]   RxID:   [id number] HYDROCHLOROTHIAZIDE 25 MG  TABS (HYDROCHLOROTHIAZIDE) 1 tablet by mouth daily  #90 x 2   Entered and Authorized by:   Sylvan Cheese MD   Signed by:   Sylvan Cheese MD on 01/06/2007   Method used:   Electronically sent to ...       [address]       [address]       [place], [place]  [address]       Ph: [phone]       Fax: [phone]   RxID:   [id number] LISINOPRIL 10 MG  TABS (LISINOPRIL) 1 tablet by mouth daily  #90 x 2   Entered and Authorized by:   Sylvan Cheese MD   Signed by:   Sylvan Cheese MD on 01/06/2007   Method used:   Electronically sent to ...       [address]*       [address]       [place], [place]  [address]       Ph: [phone]       Fax: [phone]   RxID:   [id number]  ]  Vital Signs:  Patient Profile:   60 Years Old Female Height:     64.75 inches  Weight:      216.5 pounds Temp:     98.5 degrees F Pulse rate:   95 / minute BP sitting:   131 / 84                Appended Document: Orders Update     Clinical Lists Changes  Orders: Added  new Test order of [facility]- Est  Level 4 (29562) - Signed

## 2010-02-17 NOTE — Letter (Signed)
Summary: Mammogram Reminder  [facility]  [address]   [place], [place] [address]   Phone: [phone]  Fax: [phone]    07/29/2008  Izora Elgersma [address] [place], [place]  [address]  Dear Ms. Sneath,  Our records show that you are due for your yearly mammogram. Don't wait! Mammography is an important tool in the early detection of breast cancer. Please call to schedule this important screening exam today!  I am here to help you stay healthy! Let me know if you have any questions.  Sincerely,   Helane Rima DO

## 2010-02-17 NOTE — Assessment & Plan Note (Signed)
Summary: FU LABS AND XRAY/KH   Vital Signs:  Patient profile:   60 year old female Height:      64.75 inches Weight:      219 pounds BMI:     36.86 Temp:     98.2 degrees F oral Pulse rate:   85 / minute BP sitting:   143 / 80  (left arm) Cuff size:   large  Vitals Entered By: Tessie Fass CMA (May 20, 2009 8:37 AM) CC: F/U labs and x-ray Is Patient Diabetic? No Pain Assessment Patient in pain? no        Primary Care Provider:  Helane Rima DO  CC:  F/U labs and x-ray.  History of Present Illness: 60 year old AAF presenting to discuss recent labs and xrays. We reviewed the labs and xray together and found:  1. OA: Rx Aleve 2-3 pills by mouth daily with rare use of Vicodin for breakthrough pain. She endorses HA with use of Mobic so stopped after 2 weeks usage. She takes that Aleve with food and denies dypepsia or abdominal pain. She would like to maintain this pain regimen for now as it does control her pain.  2. Anemia: MCV 86.6. Patient endorses Hx of FeSO4 deficiency anemia. She has not taken supplements in several years. She denies vaginal bleeding > 10 years. She endorses one episode of rectal bleeding "a few years ago" that was thought to be hemorrhoids. Last colonoscopy "normal" per patient. She was recently contacted by her provider for her repeat colonoscopy.   3. Vitamin D Deficiency: < 10. She is not taking Vitamin D or Calcium.  4. HLD: Rx Pravastatin.  5. Fasting Hyperglycemia : on this and several previous CMPs. She confirms fasting state.  Habits & Providers  Alcohol-Tobacco-Diet     Tobacco Status: never  Current Medications (verified): 1)  Lisinopril 20 Mg Tabs (Lisinopril) .Marland Kitchen.. 1 Tablet By Mouth Daily For Blood Pressure 2)  Hydrochlorothiazide 25 Mg  Tabs (Hydrochlorothiazide) .Marland Kitchen.. 1 Tablet By Mouth Daily 3)  Paxil 20 Mg Tabs (Paroxetine Hcl) .... Take 2 By Mouth Daily 4)  Simvastatin 40 Mg Tabs (Simvastatin) .... Take One Tablet At Bedtime 5)   Aleve 220 Mg Caps (Naproxen Sodium) .... One By Mouth Two Times A Day Three Times A Day As Needed Pain 6)  Hydrocodone-Acetaminophen 5-325 Mg Tabs (Hydrocodone-Acetaminophen) .Marland Kitchen.. 1 By Mouth Up To 4 Times Per Day As Needed For Severe Pain 7)  Ferrous Sulfate 325 (65 Fe) Mg  Tbec (Ferrous Sulfate) .... Two Times A Day 8)  Vitamin D (Ergocalciferol) 50000 Unit Caps (Ergocalciferol) .... One By Mouth Weekly X 12 Weeks  Allergies (verified): No Known Drug Allergies  Past History:  Past Medical History: HTN HLD OA Anemia Vitamin D Deficiency  Family History: Mom - HTN, melanoma, deceased 2005/06/22Dad - HTN, DM Sister - diabetes, HTN  Social History: Worked for [employer], layed off 05/2006.  Currently unemployed. No tob, no EtOH. Lives alone. Divorced. Enjoys yard Airline pilot, movies, walking. Has 1 dtr, 1 son (both grown).  Physical Exam  General:  Well-developed, well-nourished, in no acute distress; alert, appropriate and cooperative throughout examination. Vitals reviewed. Msk:  Left Knee: both knees enlarged c/w arthritis changes. Mild ttp along joint line. + crepitus. Good endpoints. Good ROM. Neg Mc[place]. Hands: Bilateral DIP, PIP enlargement.  Pulses:  R and L dorsalis pedis and posterior tibial pulses are full and equal bilaterally. Extremities:  No edema. Psych:  Normally interactive and  good eye contact.      Impression & Recommendations:  Problem # 1:  DEGENERATIVE JOINT DISEASE (ICD-715.90)  Her updated medication list for this problem includes:    Aleve 220 Mg Caps (Naproxen sodium) ..... One by mouth two times a day three times a day as needed pain    Hydrocodone-acetaminophen 5-325 Mg Tabs (Hydrocodone-acetaminophen) .Marland Kitchen... 1 by mouth up to 4 times per day as needed for severe pain  Orders: [facility]- Est  Level 4 (35009)  Problem # 2:  ANEMIA (ICD-285.9)  Her updated medication list for this problem includes:    Ferrous Sulfate 325 (65 Fe) Mg Tbec (Ferrous  sulfate) .Marland Kitchen..Marland Kitchen Two times a day  Orders: [facility]- Est  Level 4 (38182)  Problem # 3:  VITAMIN D DEFICIENCY (ICD-268.9)  Orders: [facility]- Est  Level 4 (99371)  Problem # 4:  HYPERLIPIDEMIA (ICD-272.4)  Her updated medication list for this problem includes:    Simvastatin 40 Mg Tabs (Simvastatin) .Marland Kitchen... Take one tablet at bedtime  Orders: [facility]- Est  Level 4 (69678)  Problem # 5:  IMPAIRED FASTING GLUCOSE (ICD-790.21)  Orders: [facility]- Est  Level 4 (93810)  Complete Medication List: 1)  Lisinopril 20 Mg Tabs (Lisinopril) .Marland Kitchen.. 1 tablet by mouth daily for blood pressure 2)  Hydrochlorothiazide 25 Mg Tabs (Hydrochlorothiazide) .Marland Kitchen.. 1 tablet by mouth daily 3)  Paxil 20 Mg Tabs (Paroxetine hcl) .... Take 2 by mouth daily 4)  Simvastatin 40 Mg Tabs (Simvastatin) .... Take one tablet at bedtime 5)  Aleve 220 Mg Caps (Naproxen sodium) .... One by mouth two times a day three times a day as needed pain 6)  Hydrocodone-acetaminophen 5-325 Mg Tabs (Hydrocodone-acetaminophen) .Marland Kitchen.. 1 by mouth up to 4 times per day as needed for severe pain 7)  Ferrous Sulfate 325 (65 Fe) Mg Tbec (Ferrous sulfate) .... Two times a day 8)  Vitamin D (ergocalciferol) 50000 Unit Caps (Ergocalciferol) .... One by mouth weekly x 12 weeks  Other Orders: A1C-[facility] (17510)  Patient Instructions: 1)  It was very nice to see you today!  2)  Stop Pravastatin. Start Simvastatin. 3)  Start Iron pills for your anemia. You are due for a colonoscopy. 4)  Start Vitamin D supplementation. 5)  We are checking an A1c today to check your blood sugar. 6)  Let me know if you have any problems or questions. Prescriptions: VITAMIN D (ERGOCALCIFEROL) 50000 UNIT CAPS (ERGOCALCIFEROL) one by mouth WEEKLY x 12 weeks  #12 x 0   Entered and Authorized by:   Helane Rima DO   Signed by:   Helane Rima DO on 05/20/2009   Method used:   Print then Give to Patient   RxID:   [id number] FERROUS SULFATE 325 (65 FE) MG  TBEC (FERROUS SULFATE) two  times a day  #200 x 3   Entered and Authorized by:   Helane Rima DO   Signed by:   Helane Rima DO on 05/20/2009   Method used:   Print then Give to Patient   RxID:   [id number] SIMVASTATIN 40 MG TABS (SIMVASTATIN) Take one tablet at bedtime  #90 x 3   Entered and Authorized by:   Helane Rima DO   Signed by:   Helane Rima DO on 05/20/2009   Method used:   Print then Give to Patient   RxID:   [id number]   Prevention & Chronic Care Immunizations   Influenza vaccine: Not documented   Influenza vaccine deferral: Not indicated  (05/20/2009)  Tetanus booster: 08/18/2001: Done.   Tetanus booster due: 08/19/2011    Pneumococcal vaccine: Not documented    H. zoster vaccine: Not documented  Colorectal Screening   Hemoccult: Done.  (11/23/2003)   Hemoccult due: Not Indicated    Colonoscopy: Done.  (01/18/2005)   Colonoscopy due: 01/19/2015  Other Screening   Pap smear: Done.  (08/18/2001)   Pap smear due: Not Indicated    Mammogram: ASSESSMENT: Negative - BI-RADS 1^MS DIGITAL SCREENING  (10/23/2008)   Mammogram due: 05/2007    DXA bone density scan: Not documented   Smoking status: never  (05/20/2009)  Lipids   Total Cholesterol: 224  (04/17/2009)   LDL: 161  (04/17/2009)   LDL Direct: Not documented   HDL: 38  (04/17/2009)   Triglycerides: 124  (04/17/2009)    SGOT (AST): 15  (04/17/2009)   SGPT (ALT): 12  (04/17/2009)   Alkaline phosphatase: 87  (04/17/2009)   Total bilirubin: 0.3  (04/17/2009)    Lipid flowsheet reviewed?: Yes   Progress toward LDL goal: Unchanged  Hypertension   Last Blood Pressure: 143 / 80  (05/20/2009)   Serum creatinine: 0.89  (04/17/2009)   Serum potassium 3.9  (04/17/2009)    Hypertension flowsheet reviewed?: Yes   Progress toward BP goal: At goal  Self-Management Support :   Personal Goals (by the next clinic visit) :      Personal blood pressure goal: 140/90  (03/24/2009)     Personal LDL goal: 130   (03/24/2009)    Patient will work on the following items until the next clinic visit to reach self-care goals:     Medications and monitoring: take my medicines every day, bring all of my medications to every visit  (05/20/2009)     Eating: drink diet soda or water instead of juice or soda, eat more vegetables, use fresh or frozen vegetables, eat foods that are low in salt, eat baked foods instead of fried foods, eat fruit for snacks and desserts, limit or avoid alcohol  (05/20/2009)    Hypertension self-management support: Written self-care plan  (05/20/2009)   Hypertension self-care plan printed.    Lipid self-management support: Written self-care plan  (05/20/2009)   Lipid self-care plan printed.  Laboratory Results   Blood Tests   Date/Time Received: May 20, 2009 9:49 AM  Date/Time Reported: May 20, 2009 9:49 AM   HGBA1C: 6.3%   (Normal Range: Non-Diabetic - 3-6%   Control Diabetic - 6-8%)  Comments: ...........test performed by..........Marland Kitchen [place], SMA

## 2010-02-17 NOTE — Assessment & Plan Note (Signed)
Summary: f/u mammogram results [facility]   Vital Signs:  Patient Profile:   60 Years Old Female Height:     64.75 inches Weight:      212 pounds Temp:     99 degrees F Pulse rate:   84 / minute BP sitting:   131 / 86  Pt. in pain?   yes    Location:   right ankle    Intensity:   6  Vitals Entered By: Dennison Nancy RN (May 26, 2006 4:13 PM)                Chief Complaint:  Follow up/mammogram.  History of Present Illness: f/u multiple issues:  1.  HTN- does not take BP regularly.  taking HCTZ + lisinopril regularly.  Able to afford meds.  Happy with medications, no side effects.  Denies CP, dyspnea, palpitations.  2.  HLD- taking zocor.  happy with mediation, no side effects, taking medications regularly.  3.  foot pain- ecently had left foot pain, given orthotic, and pain now much better.  Now complains of intermittent swelling in RLE anterio ankle.  Swelling resolved by morning, but worsens with activity.  Also has pain with activity. Impairing exericise routine.  4.  depression- takes paxil every other day, because she is trying to cut back on the number of medications she takes each day.  Mood much better after starting Paxil.  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  She has a 2 month Landscape architect.      Risk Factors:  Mammogram History:     Date of Last Mammogram:  05/19/2006    Results:  No specific mammographic evidence of malignancy.       Physical Exam  General:     NAD, alert, pleasant. Head:     no abnormalities observed.   Eyes:     PERRL, wearing glasses, vision grossly intact Mouth:     upper dentues in place, pharynx pink and moist, no erythema, and no exudates.   Neck:     No deformities, masses, or tenderness noted. Lungs:     CTAB Heart:     RRR, no murmur Abdomen:     nabs, soft, nt/nd, no masses Msk:     no swelling noted in right ankle compared to  left, full range of motion in both ankles, no point tenderness, overall normal exam Extremities:     no edema    Impression & Recommendations:  Problem # 1:  HYPERTENSION, BENIGN ESSENTIAL (ICD-401.1) Good control today.  Continue hctz 25mg  + lisinopril 10mg  daily. Orders: [facility]- Est  Level 4 (99214) Comp Met-[facility] ([id number])  Future Orders: Comp Met-[facility] ([id number]) ... 05/20/2007   Problem # 2:  HYPERLIPIDEMIA (ICD-272.4) time for fasting lipid profile.  Will adjust zocor accordingly. Orders: [facility]- Est  Level 4 (99214) Lipid-[facility] ([id number])  Future Orders: Lipid-[facility] ([id number]) ... 05/19/2007   Problem # 3:  SCREENING FOR DIABETES MELLITUS (ICD-V77.1)  Orders: Glucose-[facility] ([id number])  Future Orders: Glucose-[facility] ([id number]) ... 05/20/2007   Problem # 4:  DEPRESSION (ICD-311) continue paxil, as she seems pleased with it.  I mentioned tying to stop it, but she didn't like that idea.  Will get TSH, as I don't see one on her file. Orders: [facility]- Est  Level 4 (99214) TSH-[facility] ([id number])  Future Orders: TSH-[facility] ([id number]) ... 05/19/2007   Problem # 5:  Screening Breast Cancer (  ICD-V76.10) mammogam normal- due 05/2007  Problem # 6:  FOOT PAIN, RIGHT (ICD-729.5) question whether intermittent localized swelling on anterior aspect of ankle could be due to ganglion cyst that fills with fluid after activity.  recommended wrapping with tight bandage during activity.  Keep legs propped when sedentary if possible.    Mammogram  Procedure date:  05/19/2006  Findings:      No specific mammographic evidence of malignancy.    Procedures Next Due Date:    Mammogram: 05/2007   Pt. instructed to make f/u appointment in 6 months, or call earlier if any other needs sooner.

## 2010-02-17 NOTE — Miscellaneous (Signed)
Summary: Note from [facility] (02/01/06)  CC FU GI Bleed  S:  Colonoscopy results reviewed.  Recent scope with hemorrhoids and large diverticula.  Pt has had minimal bleeding, if at all, since last visit.  Recent Hgb 11.7.  She feels fine.  Recommendation from GI eval was repeat scope in 3 years.  MEMR Reviewed and Updated  O  Vs Noted.   _x_  Patient in no distress   _x_ Healthy appearing Eyes -  _x_ PERRL __ EOMI__ Fundi -  no papilledema/hemorrhage or Ears -   __ External ears Nl   __ TM clear    or  N-M-T -  __ no Eythema  __ no Exudates  __ no Discharge or  Neck -  __  no Thryromegaly   __ no Adenopathy or  Respiratory _x_ Lungs clear   _x_ No retractions or  Heart  _x_ RRR   _x_ no Murmur Gallops Rubs   _x_ normal PMI   or  Pulses -  __ Pedal Pulses  __ Femoral Pulses  or  GI  - _x_  no Masses  _x_ No Tenderness  _x_ No HSM or  Skin   - __ no Rashes   __ no Lesions or  Extremities -   __ no Edema  __ no Cyanosis or  Feet -  __ no Lesions  __ normal Sensation or    A/P 1. Diverticular/Hemorrhoidal bleed - Pt stable at this time.  RTC Jones 4 weeks.  At that time, Dr. Yetta Barre can discuss possible followup with GI. 2. Anemia - Secondary to blood loss.  Iron supplementation would be appropriate if this does not stabilize and recover.  Recommend recheck Hgb in 4 weeks and decide on this then.

## 2010-02-17 NOTE — Progress Notes (Signed)
Summary: UPDATE PROBLEM LIST   

## 2010-02-17 NOTE — Assessment & Plan Note (Signed)
Summary: Behavioral Medicine Follow-up   Primary Care Provider:  Helane Rima DO   History of Present Illness: Jasmine Gregory reports she has had four deaths / funerals to attend recently.  They weren't family members but were close in various ways.  She has managed this well and actually reports that this, in combination with preparing for a baby shower has made her busy enough that she noticed an uptick in her mood and sense of well being.  Followed up on issues from first visit. 1) Sister Jasmine Gregory can not exercise with her but Jasmine Gregory is attempting to increase her activity level and is walking. 2) She did not write down her strengths but remembered she was supposed to and reported today:  persistence; helping others; willing to try something new even if it is a challenge. 3) She is eating less at night but has had a few slip ups.  Discussed where to go from here.  Allergies: No Known Drug Allergies   Impression & Recommendations:  Problem # 1:  DEPRESSION (ICD-311) Focused on things she can control while highlighting what she can't.  She can't get more money and per her report, she has done what she can to secure a job.  In the absence of those things - what's left?  She has considered volunteering as a way to bring more meaning to her life and to make her busier during the day.  There are some barriers present but she has not yet likely exhausted her options.  Also discussed the positive effect "gratitudes" might have.  She reported feeling better overall and hopeful that additional strategies as discussed today might prove useful.  See instrucations for further detail.    Her updated medication list for this problem includes:    Paxil 20 Mg Tabs (Paroxetine hcl) .Marland Kitchen... Take 2 by mouth daily  Orders: Therapy 40-50- min- [facility] (40981)  Complete Medication List: 1)  Lisinopril 20 Mg Tabs (Lisinopril) .Marland Kitchen.. 1 tablet by mouth daily for blood pressure 2)  Hydrochlorothiazide 25 Mg Tabs  (Hydrochlorothiazide) .Marland Kitchen.. 1 tablet by mouth daily 3)  Paxil 20 Mg Tabs (Paroxetine hcl) .... Take 2 by mouth daily 4)  Simvastatin 40 Mg Tabs (Simvastatin) .... Take one tablet at bedtime 5)  Aleve 220 Mg Caps (Naproxen sodium) .... One by mouth two times a day three times a day as needed pain 6)  Hydrocodone-acetaminophen 5-325 Mg Tabs (Hydrocodone-acetaminophen) .Marland Kitchen.. 1 by mouth up to 4 times per day as needed for severe pain 7)  Ferrous Sulfate 325 (65 Fe) Mg Tbec (Ferrous sulfate) .... Two times a day 8)  Vitamin D (ergocalciferol) 50000 Unit Caps (Ergocalciferol) .... One by mouth weekly x 12 weeks 9)  Zyrtec Allergy 10 Mg Caps (Cetirizine hcl) .... One by mouth daily 10)  Fluticasone Propionate 50 Mcg/act Susp (Fluticasone propionate) .... 2 sprays each nostril once daily  Patient Instructions: 1)  Great job with working on being more active.  I am sorry to hear that you are having more back pain.  I hope that gets better and would encourage you to keep moving. 2)  We discussed volunteer opportunities since you recognize you feel better when engaged in something.  I printed off the [employer] [place] for you. 3)  Gratitude journal was something we discussed that has research behind it.  It can have a positive effect on your mood.  The goal is to write down three things that you are grateful each day and reflect for a moment  on them.   4)  You thought you would call in three or four weeks to check-in. My phone number is [phone]. 5)  Good luck!   Orders Added: 1)  Therapy 40-50- min- [facility] [[id number]]

## 2010-02-17 NOTE — Letter (Signed)
Summary: Results Letter  [facility]  [address]   [place], [place] [address]   Phone: [phone]  Fax: [phone]    05/20/2009  Jasmine Gregory [address] [place], [place]  [address]  Dear Ms. Rumer,  It was very nice to see you today! Your A1c test showed that you have pre-diabetes. I am including a handout that explains pre-diabetes and the changes that you can make now to decrease your chances of developing diabetes later.   Please call if you have any questions or concerns.  Sincerely,   Helane Rima DO

## 2010-02-17 NOTE — Assessment & Plan Note (Signed)
Summary: depression   Vital Signs:  Patient Profile:   60 Years Old Female Height:     64.75 inches Weight:      215.1 pounds Temp:     98.5 degrees F Pulse rate:   116 / minute BP sitting:   142 / 92  (left arm)  Pt. in pain?   no  Vitals Entered By: Theresia Lo RN (December 20, 2007 2:47 PM)              Is Patient Diabetic? No     PCP:  Sylvan Cheese MD   History of Present Illness: 60 yo with:  1.  depression- previously on paxil 20mg  daily, but stopped back in the summer b/c she thought her mood was alright.  Still without job, on [employer].  Finances are tight.  She is bored at home all day with nothing to do.  Divorced.  Has sister and dtr she talks with frequently for support.  Had episode 4 days ago where she suddenly developed overwhelming anxiety and had to leave a store.  Sat in car and drank water until episode resolved 10 minutes later.  She has had these episodes before.  She restarted her paxil after this episode 4 days ago.  No SI.  2.  HTN- taking HCTZ and lisinopril regularly.  No side effects.  No CP, sob, leg swelling.  3.  HLD- taking pravastatin regularly.  Last FLP was 5/08.       Social History:    worked for [employer], layed off 05/2006.  Currently unemployed.    no tob, no EtOH    lives alone; adult dtr temporarily stays with her    Divorced.    enjoys yard Airline pilot, movies, walking    has 1 dtr, 1 son (both grown)     Physical Exam  General:     alert.  Tearful Head:     normocephalic and atraumatic.   Lungs:     Normal respiratory effort, chest expands symmetrically. Lungs are clear to auscultation, no crackles or wheezes. Heart:     Normal rate and regular rhythm. S1 and S2 normal without gallop, murmur, click, rub or other extra sounds. Extremities:     No edema Neurologic:     alert & oriented X3.   Psych:     normally interactive and good eye contact.  Tearful.     Impression & Recommendations:  Problem  # 1:  DEPRESSION (ICD-311) Assessment: Deteriorated I think she is also having panic attacks.  Pt self-d/c'd paxil during the summer and restarted 4 days ago.  Advised this med may take 4-6 weeks to kick in.  F/U in 4 weeks to assess whether dose adjustment is necessary. Her updated medication list for this problem includes:    Paxil 20 Mg Tabs (Paroxetine hcl) .Marland Kitchen... 1 tablet by mouth daily  Orders: [facility]- Est  Level 4 (84696)   Problem # 2:  HYPERTENSION, BENIGN ESSENTIAL (ICD-401.1) Assessment: Deteriorated Uncontrolled.  Increased lisinopril 10mg  -> 20mg .  CMET today. Her updated medication list for this problem includes:    Lisinopril 20 Mg Tabs (Lisinopril) .Marland Kitchen... 1 tablet by mouth daily for blood pressure    Hydrochlorothiazide 25 Mg Tabs (Hydrochlorothiazide) .Marland Kitchen... 1 tablet by mouth daily  Orders: Comp Met-[facility] ([id number]) [facility]- Est  Level 4 (40102)   Problem # 3:  HYPERLIPIDEMIA (ICD-272.4) Assessment: Unchanged drew FLP today.  May have to change cholesterol med to simvastatin if not at goal (  LDL < 130). Her updated medication list for this problem includes:    Pravastatin Sodium 40 Mg Tabs (Pravastatin sodium) .Marland Kitchen... 1 tablet by mouth at bedtime for cholesterol  Orders: Lipid-[facility] ([id number]) [facility]- Est  Level 4 (57846)   Complete Medication List: 1)  Lisinopril 20 Mg Tabs (Lisinopril) .Marland Kitchen.. 1 tablet by mouth daily for blood pressure 2)  Hydrochlorothiazide 25 Mg Tabs (Hydrochlorothiazide) .Marland Kitchen.. 1 tablet by mouth daily 3)  Paxil 20 Mg Tabs (Paroxetine hcl) .Marland Kitchen.. 1 tablet by mouth daily 4)  Pravastatin Sodium 40 Mg Tabs (Pravastatin sodium) .Marland Kitchen.. 1 tablet by mouth at bedtime for cholesterol   Patient Instructions: 1)  Increase your lisinopril to 20mg  daily for your blood pressure. 2)  Continue paxil 20mg  daily for panic attacks.  3)  Follow up with Dr. Yetta Barre in 1 month as we may need to increase your paxil dose. 4)  Follow up sooner if things get worse. 5)  I will call or  send a letter with your lab results.   Prescriptions: PRAVASTATIN SODIUM 40 MG  TABS (PRAVASTATIN SODIUM) 1 tablet by mouth at bedtime for cholesterol  #90 x 2   Entered and Authorized by:   Sylvan Cheese MD   Signed by:   Sylvan Cheese MD on 12/20/2007   Method used:   Electronically to        [facility]* (retail)       [address]       [place], [place]  [address]       Ph: [phone]       Fax: [phone]   RxID:   [id number] PAXIL 20 MG  TABS (PAROXETINE HCL) 1 tablet by mouth daily  #30 x 1   Entered and Authorized by:   Sylvan Cheese MD   Signed by:   Sylvan Cheese MD on 12/20/2007   Method used:   Electronically to        [facility]* (retail)       [address]       [place], [place]  [address]       Ph: [phone]       Fax: [phone]   RxID:   [id number] LISINOPRIL 20 MG TABS (LISINOPRIL) 1 tablet by mouth daily for blood pressure  #30 x 6   Entered and Authorized by:   Sylvan Cheese MD   Signed by:   Sylvan Cheese MD on 12/20/2007   Method used:   Electronically to        [facility]* (retail)       [address]       [place], [place]  [address]       Ph: [phone]       Fax: [phone]   RxID:   [id number]  ]  Vital Signs:  Patient Profile:   60 Years Old Female Height:     64.75 inches Weight:      215.1 pounds Temp:     98.5 degrees F Pulse rate:   116 / minute BP sitting:   142 / 92                Flex Sig Next Due:  Not Indicated Last Hemoccult Result: Done. (11/23/2003 12:00:00 AM) Hemoccult Next Due:  Not Indicated Last PAP:  Done. (08/18/2001 12:00:00 AM) PAP Next Due:  Not Indicated

## 2010-02-17 NOTE — Miscellaneous (Signed)
Summary: Note from [facility] (11/07)  CC: f/u, med refill  HPI: This is my first time meeting Jasmine Gregory.  She states that she has no new problems or concerns today.   Hypertension- she has not run out of Hyzaar.  She took her med this morning.  She does not check her BP at home.  She denies ever having CP or SOB.  She is concerned about bringing down her weight in an effort to lower her BP.  She says she knows what she is supposed to eat and what she is supposed to avoid, but has a hard time doing it.  Depression- she has been out of Paxil for 2 weeks now.  She says her depression is mostly related to loneliness as she lives alone.  She has children and grandchildren in the area, but doesn't get to see them as often as she would like.  The holidays are particularly difficult for her.  She has her sister and church for support.  She denies suicidal ideations.  She says the Paxil usually helps with her moods.  Her job is providing much stress right now.  She works for [employer] and they are planning on moving their company to [place].  She finds out next week whether or not she will have a job.  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  Vitals noted.  BP 140s/80s on recheck  Gen- pleasant, NAD CV- RRR, no murmur Lungs- CTAB, no wheezes or rhonchi, normal effort Abd- obese Ext- no edema  A/P: 60 yo AAF with: 1. HTN- will change all meds to [facility] formulary as her current cost for meds is $15 per prescription.  Therefore, her Hyzaar 12.5/50 will change to HCTZ 25mg  daily + lisinopril 10mg  daily.  We will check a BMET and f/u her BP at the next visit.  2.  hyperlipidemia- her last lipid check was in April 2007.  She was given a pink slip to have her fasting lipids checked prior to her next appointment in March/April.  Her last LDL was 130.  Her goal is < 130 given that she has 2 risk factors (age and  HTN).  Will continue zocor for now until her next FLP.  3.  Depression- will refill paxil.  She seems to be having much stress at work lately.  The holidays could also be particularly stressful.  Will try to refer her to Dr. Pascal Lux at her next appointment if she is willing.  4.  HM- she says she had a colonoscopy this summer, but there is no report in her chart and none in the electronic transcription reports.  She says she was told to come back for colonoscopy in 2-3 years because she had inadequate clean-out, but no definite abnormalities visualized.  She got a flu shot last month at work.  She says she had a mammogram this year.

## 2010-02-17 NOTE — Progress Notes (Signed)
Summary: phn msg   Phone Note Call from Patient Call back at [phone]   Summary of Call: Pt returning Dr. Philis Pique call. Initial call taken by: Clydell Hakim,  May 05, 2009 11:43 AM  Follow-up for Phone Call        please see previous note. Follow-up by: Helane Rima DO,  May 05, 2009 1:24 PM  Additional Follow-up for Phone Call Additional follow up Details #1::        called pt lmom to return call Additional Follow-up by: Tessie Fass CMA,  May 05, 2009 4:56 PM    Additional Follow-up for Phone Call Additional follow up Details #2::    spoke with pt, told her to get the x-ray dr wallace ordered, schedule a lab visit with [place] and then a follow up ov. pt agreed. Follow-up by: Tessie Fass CMA,  May 06, 2009 9:18 AM

## 2010-02-17 NOTE — Assessment & Plan Note (Signed)
Summary: ear problems,df   Vital Signs:  Patient profile:   60 year old female Height:      64.75 inches Weight:      220.3 pounds BMI:     37.08 Temp:     98.7 degrees F oral Pulse rate:   95 / minute BP sitting:   141 / 74  (left arm) Cuff size:   large  Vitals Entered By: Garen Grams LPN (October 27, 2009 8:41 AM) CC: ringing in right ear x 1 month, OA pain, depression, HLD Is Patient Diabetic? No Pain Assessment Patient in pain? no        Primary Care Provider:  Helane Rima DO  CC:  ringing in right ear x 1 month, OA pain, depression, and HLD.  History of Present Illness: 60 year old AAF:  1. Right Ear Ringing: Intermittent, x 1 month. Associated with runny nose, congestion, PND, cough. No fever/chills, HA, ear fullness, vertigo, decreased hearing, CP, SOB, N/V/D, rash. + Hx of allergic rhinitis, takes Zyrtec about 1 time/week.  1. OA: Rx Aleve 2-3 pills by mouth daily with rare use of Vicodin for breakthrough pain. She endorses HA with use of Mobic so stopped after 2 weeks usage. She takes that Aleve with food and denies dypepsia or abdominal pain. BP usually WNL when she checks at home. She would like to maintain this pain regimen for now as it does control her pain.  3. Depression: Rx Paxil. Endorses increased stressors recently - "money, home, rejection." No SI/HI. Interested in counseling.   4. HLD: Rx Simvastatin (changed at last visit). Denies myalgias. Not fasting this am.    Habits & Providers  Alcohol-Tobacco-Diet     Tobacco Status: never     Passive Smoke Exposure: no  Current Medications (verified): 1)  Lisinopril 20 Mg Tabs (Lisinopril) .Marland Kitchen.. 1 Tablet By Mouth Daily For Blood Pressure 2)  Hydrochlorothiazide 25 Mg  Tabs (Hydrochlorothiazide) .Marland Kitchen.. 1 Tablet By Mouth Daily 3)  Paxil 20 Mg Tabs (Paroxetine Hcl) .... Take 2 By Mouth Daily 4)  Simvastatin 40 Mg Tabs (Simvastatin) .... Take One Tablet At Bedtime 5)  Aleve 220 Mg Caps (Naproxen Sodium)  .... One By Mouth Two Times A Day Three Times A Day As Needed Pain 6)  Hydrocodone-Acetaminophen 5-325 Mg Tabs (Hydrocodone-Acetaminophen) .Marland Kitchen.. 1 By Mouth Up To 4 Times Per Day As Needed For Severe Pain 7)  Ferrous Sulfate 325 (65 Fe) Mg  Tbec (Ferrous Sulfate) .... Two Times A Day 8)  Vitamin D (Ergocalciferol) 50000 Unit Caps (Ergocalciferol) .... One By Mouth Weekly X 12 Weeks 9)  Zyrtec Allergy 10 Mg Caps (Cetirizine Hcl) .... One By Mouth Daily 10)  Fluticasone Propionate 50 Mcg/act  Susp (Fluticasone Propionate) .... 2 Sprays Each Nostril Once Daily  Allergies (verified): No Known Drug Allergies PMH-FH-SH reviewed for relevance  Review of Systems      See HPI  Physical Exam  General:  Well-developed, well-nourished, in no acute distress; alert, appropriate and cooperative throughout examination. Vitals reviewed. Eyes:  No injection. Ears:  Cerumen both ears. Nose:  Nasal discharge, mucosal pallor.   Mouth:  Oral mucosa and oropharynx without lesions or exudates. Dentures. Neck:  No deformities, masses, or tenderness noted. Lungs:  Normal respiratory effort, chest expands symmetrically. Lungs are clear to auscultation, no crackles or wheezes. Heart:  Normal rate and regular rhythm. S1 and S2 normal without gallop, murmur, click, rub or other extra sounds. Msk:  Right Knee: Both knees enlarged c/w arthritis changes.  Mild ttp along joint line. + crepitus. Good endpoints. Good ROM. Neg [place].  Pulses:  R and L dorsalis pedis and posterior tibial pulses are full and equal bilaterally. Extremities:  No edema. Skin:  Intact without suspicious lesions or rashes. Psych:  Oriented X3 and tearful.     Impression & Recommendations:  Problem # 1:  TINNITUS, RIGHT (ICD-388.30) Assessment New Looks to be related to allergic rhinitis and cerumen impaction. See below. Orders: [facility]- Est  Level 4 (03500)  Problem # 2:  CERUMEN IMPACTION, BILATERAL (ICD-380.4) Assessment: New Ear lavage  attempted. However, patient developed vertigo so discontinued. She was much improved after 5 minutes of rest. Because she did not tolerate ear lavage, rec Debrox - 2 drops in each ear daily to dissolve wax more slowly. Advised patient to be sitting and to have someone with her the first time she used Deprox in case she has similar reaction. She was given RED FLAGs. Orders: [facility]- Est  Level 4 (93818)  Problem # 3:  ALLERGIC RHINITIS (ICD-477.9) Assessment: Deteriorated Advised taking Zyrtec daily and adding Fluticasone nasal spray.  Her updated medication list for this problem includes:    Zyrtec Allergy 10 Mg Caps (Cetirizine hcl) ..... One by mouth daily    Fluticasone Propionate 50 Mcg/act Susp (Fluticasone propionate) .Marland Kitchen... 2 sprays each nostril once daily  Orders: [facility]- Est  Level 4 (29937)  Problem # 4:  DEGENERATIVE JOINT DISEASE (ICD-715.90) Assessment: Unchanged Pain regimen continues to be adequate at this time. Using Vicodin once every 2 weeks. Advised patient to follow-up if s/s worsening for injection vs PT vs both or referral to Ortho/SM. Her updated medication list for this problem includes:    Aleve 220 Mg Caps (Naproxen sodium) ..... One by mouth two times a day three times a day as needed pain    Hydrocodone-acetaminophen 5-325 Mg Tabs (Hydrocodone-acetaminophen) .Marland Kitchen... 1 by mouth up to 4 times per day as needed for severe pain  Orders: [facility]- Est  Level 4 (99214)  Problem # 5:  DEPRESSION (ICD-311) Assessment: Deteriorated Situational. No change to medications. No RED FLAGs. Follow up with psychologist, Dr. Pascal Lux, for therapy. Her updated medication list for this problem includes:    Paxil 20 Mg Tabs (Paroxetine hcl) .Marland Kitchen... Take 2 by mouth daily  Orders: [facility]- Est  Level 4 (16967)  Problem # 6:  HYPERLIPIDEMIA (ICD-272.4) Assessment: Unchanged Due for recehck. Future order. Her updated medication list for this problem includes:    Simvastatin 40 Mg Tabs (Simvastatin)  .Marland Kitchen... Take one tablet at bedtime  Orders: [facility]- Est  Level 4 (99214)Future Orders: T-Lipid Profile ([id number]) ... 10/31/2010  Complete Medication List: 1)  Lisinopril 20 Mg Tabs (Lisinopril) .Marland Kitchen.. 1 tablet by mouth daily for blood pressure 2)  Hydrochlorothiazide 25 Mg Tabs (Hydrochlorothiazide) .Marland Kitchen.. 1 tablet by mouth daily 3)  Paxil 20 Mg Tabs (Paroxetine hcl) .... Take 2 by mouth daily 4)  Simvastatin 40 Mg Tabs (Simvastatin) .... Take one tablet at bedtime 5)  Aleve 220 Mg Caps (Naproxen sodium) .... One by mouth two times a day three times a day as needed pain 6)  Hydrocodone-acetaminophen 5-325 Mg Tabs (Hydrocodone-acetaminophen) .Marland Kitchen.. 1 by mouth up to 4 times per day as needed for severe pain 7)  Ferrous Sulfate 325 (65 Fe) Mg Tbec (Ferrous sulfate) .... Two times a day 8)  Vitamin D (ergocalciferol) 50000 Unit Caps (Ergocalciferol) .... One by mouth weekly x 12 weeks 9)  Zyrtec Allergy 10 Mg Caps (Cetirizine hcl) .... One  by mouth daily 10)  Fluticasone Propionate 50 Mcg/act Susp (Fluticasone propionate) .... 2 sprays each nostril once daily  Other Orders: Flu Vaccine 27yrs + (27253) Admin 1st Vaccine (66440)  Patient Instructions: 1)  It was very nice to see you today!  2)  Come back fasting for a cholesterol check. 3)  Please call Dr. Pascal Lux or other professional  for therapy. 4)  [facility]: [phone] 5)  [facility] [facility] [employer] [facility] [place]: www.mhag.org 6)            Under "Get Help" Select "Find a Mental Health Professional" 7)  Let me know if you have any problems or questions. 8)  Follow up in 1-2 months to discuss your chronic medical issues. 9)  You are due for a mammogram. Prescriptions: HYDROCODONE-ACETAMINOPHEN 5-325 MG TABS (HYDROCODONE-ACETAMINOPHEN) 1 by mouth up to 4 times per day as needed for severe pain  #30 x 0   Entered and Authorized by:   Helane Rima DO   Signed by:   Helane Rima DO on 10/27/2009   Method used:   Print then Give  to Patient   RxID:   [id number] FLUTICASONE PROPIONATE 50 MCG/ACT  SUSP (FLUTICASONE PROPIONATE) 2 sprays each nostril once daily  #1 vial x 3   Entered and Authorized by:   Helane Rima DO   Signed by:   Helane Rima DO on 10/27/2009   Method used:   Electronically to        [facility] [phone]* (retail)       [address]       [place], [place]  [address]       Ph: [phone]       Fax: [phone]   RxID:   [id number]   Prevention & Chronic Care Immunizations   Influenza vaccine: Fluvax 3+  (10/27/2009)   Influenza vaccine deferral: Not indicated  (05/20/2009)    Tetanus booster: 08/18/2001: Done.   Tetanus booster due: 08/19/2011    Pneumococcal vaccine: Not documented    H. zoster vaccine: Not documented    Immunization comments: Zoster information provided.  Colorectal Screening   Hemoccult: Done.  (11/23/2003)   Hemoccult due: Not Indicated    Colonoscopy: Done.  (01/18/2005)   Colonoscopy due: 01/19/2015  Other Screening   Pap smear: Done.  (08/18/2001)   Pap smear due: Not Indicated    Mammogram: ASSESSMENT: Negative - BI-RADS 1^MS DIGITAL SCREENING  (10/23/2008)   Mammogram due: 05/2007    DXA bone density scan: Not documented   Smoking status: never  (10/27/2009)  Lipids   Total Cholesterol: 224  (04/17/2009)   Lipid panel action/deferral: Lipid Panel ordered   LDL: 161  (04/17/2009)   LDL Direct: Not documented   HDL: 38  (04/17/2009)   Triglycerides: 124  (04/17/2009)    SGOT (AST): 15  (04/17/2009)   SGPT (ALT): 12  (04/17/2009)   Alkaline phosphatase: 87  (04/17/2009)   Total bilirubin: 0.3  (04/17/2009)    Lipid flowsheet reviewed?: Yes   Progress toward LDL goal: Unchanged  Hypertension   Last Blood Pressure: 141 / 74  (10/27/2009)   Serum creatinine: 0.89  (04/17/2009)   Serum potassium 3.9  (04/17/2009)    Hypertension flowsheet reviewed?: Yes   Progress toward BP goal: At goal  Self-Management Support :    Personal Goals (by the next clinic visit) :      Personal blood pressure goal: 140/90  (03/24/2009)     Personal LDL goal: 130  (  03/24/2009)    Patient will work on the following items until the next clinic visit to reach self-care goals:     Medications and monitoring: take my medicines every day, bring all of my medications to every visit  (10/27/2009)     Eating: drink diet soda or water instead of juice or soda, eat more vegetables, use fresh or frozen vegetables, eat foods that are low in salt, eat baked foods instead of fried foods, eat fruit for snacks and desserts, limit or avoid alcohol  (10/27/2009)     Activity: take a 30 minute walk every day, take the stairs instead of the elevator, park at the far end of the parking lot  (10/27/2009)    Hypertension self-management support: Written self-care plan  (10/27/2009)   Hypertension self-care plan printed.    Lipid self-management support: Written self-care plan  (10/27/2009)   Lipid self-care plan printed.   Nursing Instructions: Give Flu vaccine today    Immunizations Administered:  Influenza Vaccine # 1:    Vaccine Type: Fluvax 3+    Site: right deltoid    Mfr: GlaxoSmithKline    Dose: 0.5 ml    Route: IM    Given by: Jone Baseman CMA    Exp. Date: 07/15/2010    Lot #: [id number]    VIS given: 08/12/09 version given October 27, 2009.  Flu Vaccine Consent Questions:    Do you have a history of severe allergic reactions to this vaccine? no    Any prior history of allergic reactions to egg and/or gelatin? no    Do you have a sensitivity to the preservative Thimersol? no    Do you have a past history of Guillan-Barre Syndrome? no    Do you currently have an acute febrile illness? no    Have you ever had a severe reaction to latex? no    Vaccine information given and explained to patient? yes    Are you currently pregnant? no

## 2010-02-17 NOTE — Letter (Signed)
Summary: Lipid Letter  [facility]  [address]   [place], [place] [address]   Phone: [phone]  Fax: [phone]    12/26/2007  Lynnsie Linders [address] [place], [place]  [address]  Dear Eber Jones:  We have carefully reviewed your last lipid profile from 12/20/2007 and the results are noted below with a summary of recommendations for lipid management.    Cholesterol:       219     Goal: < 200   HDL "good" Cholesterol:   56     Goal: > 45   LDL "bad" Cholesterol:   143     Goal: < 130   Triglycerides:       101     Goal: < 250    Current Medications: 1)    Lisinopril 20 Mg Tabs (Lisinopril) .Marland Kitchen.. 1 tablet by mouth daily for blood pressure 2)    Hydrochlorothiazide 25 Mg  Tabs (Hydrochlorothiazide) .Marland Kitchen.. 1 tablet by mouth daily 3)    Paxil 20 Mg  Tabs (Paroxetine hcl) .Marland Kitchen.. 1 tablet by mouth daily 4)    Pravastatin Sodium 40 Mg  Tabs (Pravastatin sodium) .Marland Kitchen.. 1 tablet by mouth at bedtime for cholesterol  Your bad cholesterol is too high.  If you are already taking the pravastatin for your cholesterol daily, then we will need to change your cholesterol medication to a stronger one.  Simvastatin is a medication that is stronger and is generic at [employer] and [employer].  Please call if you are interested in switching your cholesterol medication to simvastatin.  If you have any questions, please call. We appreciate being able to work with you.   Sincerely,    [facility] Sylvan Cheese MD  Appended Document: Lipid Letter sent

## 2010-02-17 NOTE — Letter (Signed)
Summary: Lipid Letter  [facility]  [address]   [place], [place] [address]   Phone: [phone]  Fax:     06/01/2006  Charnell Peplinski [address] [place], [place]  [address]  Dear Eber Jones:  We have carefully reviewed your last lipid profile from 05/31/2006 and the results are noted below with a summary of recommendations for lipid management.    Cholesterol:       223     Goal: < 250   HDL "good" Cholesterol:   53     Goal: > 45   LDL "bad" Cholesterol:     151     Goal: < 130   Triglycerides:       96     Goal: < 200  Your "bad choelsterol (LDL)" is still high despite taking simvastatin 20mg  once daily.  I would like to see your bad cholesterol level less than 130.  Therefore, I think we need to make a medication change.  I would like you to STOP SIMVASTATIN, and I have sent a prescription for pravastatin 40mg  once daily to [facility].  This medication will replace simvastatin.  We will plan to check your cholesterol again in 2-3 months, as it needs close monitoring.  Your other labs including your kidney function, liver function, and thryoid function were all normal.  Also, there is no evidence that you have diabetes.  Congratulations and keep up all your hard work.   If you have any questions, please call.  I you will make an appointment with me in 2-3 months so we can follow-up on all the issues we discussed at your last visit.   Sincerely,    [facility] Sylvan Cheese MD    Appended Document: Lipid Letter letter sent  admin team

## 2010-02-17 NOTE — Progress Notes (Signed)
Summary: triage   Phone Note Call from Patient Call back at Home Phone [phone]   Caller: Patient Summary of Call: Pt coughing and would like to be seen today. Initial call taken by: Clydell Hakim,  March 03, 2009 8:53 AM  Follow-up for Phone Call        states cough is starting to calm down.  she slept better last night. just vomited .  but states she is getting her energy back. does not want an appt at this time. asked that she call later today before we close if she she wants to be seen in am tomorrow. she agreed. Follow-up by: Golden Circle RN,  March 03, 2009 9:59 AM

## 2010-02-17 NOTE — Assessment & Plan Note (Signed)
Summary: fu wk   Vital Signs:  Patient Profile:   60 Years Old Female Weight:      212 pounds Pulse rate:   80 / minute BP sitting:   132 / 76  Pt. in pain?   yes    Location:   foot    Intensity:   9  Vitals Entered By: Arlyss Repress CMA, (April 28, 2006 4:11 PM)              Is Patient Diabetic? No   Chief Complaint:  bilateral foot pain.  History of Present Illness: Pt has pain over the tops of both of her feet.  The pain is a dull ache.  The distribution is the mid dorsum over a 2-3cm region starting 1cm from the distal metatarsals and radiating proximally.  The pain has been noticeable for several weeks.  She denies trauma.  Rest helps, walking exacerbates.  She is not terribly active at baseline.  She says she has bad arches, though she has not been seen formally for this.  She denies trauma, neuromotor deficit, point tenderness,  skin changes, swelling, redness or heat.  She has no prior history of this kind of problem, no gout or peripheral vascular disease, no diabetes.  Acute Visit History:      The patient complains of musculoskeletal symptoms.       Risk Factors:  Tobacco use:  never    Physical Exam  General:     Well-developed,well-nourished,in no acute distress; alert,appropriate and cooperative throughout examination Lungs:     Normal respiratory effort, chest expands symmetrically. Lungs are clear to auscultation, no crackles or wheezes. Heart:     Normal rate and regular rhythm. S1 and S2 normal without gallop, murmur, click, rub or other extra sounds.   Foot/Ankle Exam  General:    Well-developed, well-nourished, in no acute distress; alert and oriented x 3.    Gait:    Normal heel-toe gait pattern bilaterally.    Skin:    Intact with no erythema; no scarring.    Inspection:    plantigrade foot with normal alignment of leg, ankle, hindfoot, and foot.   Palpation:    non-tender to palpation over distal leg, medial and lateral  ankle, distal achilles tendon, medial and lateral hindfoot, posterior heel, plantar heel, midfoot and forefoot bilaterally.   Vascular:    dorsalis pedis and posterior tibial pulses 2+ and symmetric, capillary refill < 2 seconds, normal hair pattern, no evidence of ischemia.   Sensory:    gross sensation intact bilaterally in lower extremities.    Motor:    Motor strength 5/5 bilaterally for quadriceps, hamstrings, ankle dorsiflexion, ankle plantar flexion, hindfoot eversion, hindfoot inversion, EHL (extensor hallicus longus), and FHL (flexor hallicus longus).    Reflexes:    Normal and symmetric patellar and Achilles reflexes bilaterally.    Ankle Exam:    Right:    Inspection:  Normal    Palpation:  Normal    Stability:  stable    Tenderness:  no    Swelling:  no    Erythema:  no    Range of Motion:       Dorsiflex-Active: 60       Plantar Flex-Active: 45       Eversion-Active: 45       Inversion-Active: 75       Dorsiflex-Passive: 60       Plantar Flex-Passive: 45       Eversion-Passive: 45  Inversion-Passive: 75       Transverse Tarsal: full    Left:    Inspection:  Normal    Palpation:  Normal    Stability:  stable    Tenderness:  no    Swelling:  no    Erythema:  no    Range of Motion:       Dorsiflex-Active: 60       Plantar Flex-Active: 45       Eversion-Active: 45       Inversion-Active: 75       Dorsiflex-Passive: 60       Plantar Flex-Passive: 45       Eversion-Passive: 45       Inversion-Passive: 75       Transverse Tarsal: full  Foot Exam:    Right:    Inspection:  Abnormal    Palpation:  Normal    Stability:  stable    Tenderness:  no    Swelling:  no    Erythema:  no    Mild posterior longitudinal arch collapse.  Transverse arch collapse.    Range of Motion:       Hallux MTP Dorsiflex-Active: 60       Hallux MTP Plantar Flex-Active: 45       Hallux IP-Active: full       Hallux MTP Dorsiflex-Passive: 60       Hallux MTP Plantar  Flex-Passive: 45       Hallux IP-Passive: full    Left:    Inspection:  Abnormal    Palpation:  Normal    Stability:  stable    Tenderness:  no    Swelling:  no    Erythema:  no    Mild posterior longitudinal arch collapse.  Transverse arch collapse.    Range of Motion:       Hallux MTP Dorsiflex-Active: 60       Hallux MTP Plantar Flex-Active: 45       Hallux IP-Active: full       Hallux MTP Dorsiflex-Passive: 60       Hallux MTP Plantar Flex-Passive: 45       Hallux IP-Passive: full    Impression & Recommendations:  Problem # 1:  DEFORMITY, CAVUS, FOOT, ACQUIRED (ICD-736.73) Assessment: New Crafted temporary orthotics with metatarsal pads. Instructed patient to use these and to RTC to see primary MD in 4 weeks for evaluation. RTC if sx markedly worsen.  Reviewed patient records and could not find BMD testing.  Stress fracture unlikely to be bilateral without trauma.  However, risk assessment for ongoing bone health prudent.  Total time spent: 41m.   Orders: [facility]- Est  Level 4 ([address]214) Bunion Ambrose Pancoast [phone]) [facility]- Orthotic Materials [phone])

## 2010-02-17 NOTE — Progress Notes (Signed)
Summary: has questions   Phone Note Call from Patient Call back at Home Phone [phone]   Caller: Patient Summary of Call: pt was taking meds for depression and she has quit taking it ans is now jittery,  is this a side effect of stopping? Initial call taken by: De Nurse,  December 19, 2007 10:20 AM  Follow-up for Phone Call        pt states she stopped taking paxil 3-4 mos ago and wants to know if this is cauing her to have "jitters" right now. Advised that she would have had a  reaction 3 mos ago. sh will make an appt to evaluate her symptoms. Follow-up by: Alphia Kava,  December 19, 2007 10:28 AM

## 2010-02-17 NOTE — Progress Notes (Signed)
   Phone Note Call from Patient Call back at Home Phone [phone]   Caller: Patient Summary of Call: Pt states that pharmacy only offers $4 copay on the 20mg  paxil.  MD recently increased to 40mg  and this is full price.  Please advise.   Initial call taken by: Rae Roam,  September 24, 2008 9:40 AM  Follow-up for Phone Call        fowarded to pcp to see what changes she can make Follow-up by: Golden Circle RN,  September 24, 2008 9:49 AM    Please ask the patient if she would like to continue the Paxil (I can write for the 20 mg dose to take 2 daily, this will increase the cost to $8 per month). Otherwise, I can switch to Prozac or Celexa (both good choices and both on the $4 list even at increased doses).   Appended Document:  LMAM FOR PT TO CALL BACK. SEE MESSAGE PLEASE

## 2010-02-17 NOTE — Assessment & Plan Note (Signed)
Summary: Initial Behavioral Medicine   Primary Care Provider:  Helane Rima DO   History of Present Illness: Jasmine Gregory presented for an initial psychological assessment.  Presenting problem: Depression.  First issue with mood was around age 60 when she broke up with her daughter's father and experienced several deaths of friends.  She remembers crying for a week - not being able to control it and having a very low mood, lack of motivation, no desire to go anywhere or be arond people.  She has had a few more episodes throughout her life.  She has been on Paxil for "years" and about 6 months ago (per her report), it was increased to 40 mg.  She thinks it is helping.    Current symptoms include tearfulness and sadness, lack of motivation, overeating (she estimates gaining about 40 pounds since losing her job nearly four years ago), some anhedonia (although she is able to experience pleasure).  She reports her energy to be getting better and attributes her sleep difficulties to pain condition (back and knees).  She has frequent nighttime awakenings - no initial onset nor early morning difficulties.  She denies suicidal ideation.  I noted her restlessness in her chair and she said she gets anxious when she comes to the doctor.  Regarding anxiety, states she has always had "nerve problems" and remembers her mother telling her she pulled her hair out as a kid secondary to nerves.  Gets most nervous when in situations like the doctor or church (i.e. fear of negative evaluation).  She denies difficulty going to her dad's house or being around other family or shopping at [employer], [employer] etc..Marland KitchenDenies treatment for anxiety.  Medical history / medications: Reviewed problem list, medications and lab tests.  TSH last done in 2008 as far as I can tell.  Recent labs (05/2009) showed anemia and Vitamin D level (03/2009) less than 10.  Family history: Jasmine Gregory is divorced.  She was married one time and had a son (61)  who lives with his girlfriend.  He has two older children from a previous relationship.  He pays Mallerie's rent.  Jasmine Gregory also has a daughter Jasmine Gregory, 46) who lives close by and works at [employer].  Both kids help out financially.    [name] is the fourth of nine children, eight of whom are still living.  Her parents were married until her mother's death from melanoma about 6 years ago.  Her father is alive at 106 and has dementia.  He lives with one of Jasmine Gregory's sister and Jasmine Gregory visits him daily.  He can not live alone.  Jasmine Gregory has not been in a relationship for 10 years.  She would like to be.  History of abuse:  Did not assess.  Education / Occupation.  Finished high school.  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 The company moved to [place] and she no longer had a job.  She reports trying to find work since that time.  She is discouraged.  Pain in knees and back might limit her.  She has applied for disability.  She has tried to find children that she could look after during the day but most of the people she knows get vouchers for their kids to attend daycare.    Substance use: Never smoked.  Denies alcohol or other drugs.  Drinks one Pepsi a day down from 4-5.  Drinks Kool-aid and juice and is trying to drink more water.  Other: Attends [place]  church about twice a month.  "Should" go more frequently.  Prays daily and "it works."  Has dabbled in exercise.  Thinks it would help.  Snacks at 11:00 at night and eats about three meals a day.  Has some structure to her day.      Allergies: No Known Drug Allergies   Impression & Recommendations:  Problem # 1:  DEPRESSION (ICD-311)  [name] is neatly groomed and appropriately dressed.  She maintains good eye contact and is cooperative and attentive.  Her speech is normal in tone, rate and rhythm.  Her mood is sad with a consistent affect although it is variable and she is able to laugh.  Thought process is  logical and goal directed.  Denied suicidal or homicidal ideation.  Does not appear to be responding to any internal stimuli.  Able to maintain train of thought and concentrate on the questions.  Judgment and insight are average.  Might have some issues with health literacy.  Plan to discuss lab results with Dr. Earlene Plater.  Noted some other increased values that give me pause.  Patient's report of depression sounds like it was episodic with reoccurrences to the point where remaining on medication (Paxil) seemed reasonable.  She does believe it helps.  In addition to symptoms reported in HPI, she endorses no purpose or meaning in her life.  She is able to structure things somewhat and feels good about the time she spends with her ailing father but overall, feels stagnant.  She believes she has exhausted the job search and struggles financially.  Depending on her children in this way feels bad to her.  Applying for disability might actually impede progress in terms of her getting "well."    She remembers from her last therapy experience (greater than 10 year ago), scheduling pleasant activities, treating herself to something nice and increaing her social interactions.    See patient instructions for tentative plan.  MIght provide some psychoeducation around positive psychology strategies.  Her updated medication list for this problem includes:    Paxil 20 Mg Tabs (Paroxetine hcl) .Marland Kitchen... Take 2 by mouth daily  [phone]  Complete Medication List: 1)  Lisinopril 20 Mg Tabs (Lisinopril) .Marland Kitchen.. 1 tablet by mouth daily for blood pressure 2)  Hydrochlorothiazide 25 Mg Tabs (Hydrochlorothiazide) .Marland Kitchen.. 1 tablet by mouth daily 3)  Paxil 20 Mg Tabs (Paroxetine hcl) .... Take 2 by mouth daily 4)  Simvastatin 40 Mg Tabs (Simvastatin) .... Take one tablet at bedtime 5)  Aleve 220 Mg Caps (Naproxen sodium) .... One by mouth two times a day three times a day as needed pain 6)   Hydrocodone-acetaminophen 5-325 Mg Tabs (Hydrocodone-acetaminophen) .Marland Kitchen.. 1 by mouth up to 4 times per day as needed for severe pain 7)  Ferrous Sulfate 325 (65 Fe) Mg Tbec (Ferrous sulfate) .... Two times a day 8)  Vitamin D (ergocalciferol) 50000 Unit Caps (Ergocalciferol) .... One by mouth weekly x 12 weeks 9)  Zyrtec Allergy 10 Mg Caps (Cetirizine hcl) .... One by mouth daily 10)  Fluticasone Propionate 50 Mcg/act Susp (Fluticasone propionate) .... 2 sprays each nostril once daily  Patient Instructions: 1)  Schedule an appt for:  November 9th at 10:00. 2)  Consider talking to Nicole Cella about committing to an exercise program.  You thought two days to get started would be reasonable.  Exercise can be a dirty word.  Think about it as sitting less, moving more.  Anything that gets you moving a little  more can be a good thing.  You identified yard sales as a potential way to move more. 3)  You agreed to right down what you consider your strengths.  I would like you to start thinking about engaging in something meaningful and looking at your strengths might be a good place to start. 4)  Drinking calories is one of the best ways to prevent weight loss.  You have done a great job cutting down on Pepsis.  Yeah!  Kool-aid and juice have a lot of sugars that you probably aren't even enjoying as much as you should.  Take a look at this and see if there is room for improvement.   5)  Just for an experiment.  Next time you are getting an 11:00 snack, take a moment to ask yourself:  Am I hungry?   Orders Added: 1)  Diagnostic Interview- [facility] [[id number]]

## 2010-02-17 NOTE — Assessment & Plan Note (Signed)
Summary: back pain wp    Vital Signs:  Patient Profile:   60 Years Old Female Height:     64.75 inches Weight:      215 pounds Temp:     98.4 degrees F Pulse rate:   108 / minute BP sitting:   136 / 80  Pt. in pain?   yes    Location:   lower back    Intensity:   9  Vitals Entered By: Jone Baseman CMA (May 15, 2007 10:12 AM)                  Chief Complaint:  back pain x 3 days.  History of Present Illness: S: low back pain x 3 days. thought it was gas. pain worse with movement. took alleve late last night and this morning which helped some. No precipitating factors or trauma. no sciatica. no hematuria, abdominal, n/v, dysuria.   Back Pain History:      The patient's back pain started approximately 05/13/2007.  The pain is located in the lower back region and does not radiate below the knees.  She states this is not work related.  On a scale of 1-10, she describes the pain as a 9.  She states that she has had a prior history of back pain.  The patient has not had any recent physical therapy for her back pain.  The following makes the back pain better: sitting up.  The following makes the back pain worse: movement.       Past Medical History:    Reviewed history from 01/06/2007 and no changes required:       fasting glucose 104 5/05       fasting glucose 108 5/08  Past Surgical History:    Reviewed history from 05/26/2006 and no changes required:       1995- hysterectomy for fibroids   Family History:    Reviewed history from 05/26/2006 and no changes required:       Mom - HTN, melanoma, deceased July 27, 2003      Dad - HTN, DM       sister - diabetes, HTN  Social History:    Reviewed history from 01/06/2007 and no changes required:       worked for [employer], layed off 05/2006.  Currently unemployed.       no tob, no EtOH       lives alone; adult dtr temporarily stays with her       enjoys yard Airline pilot, movies, walking       has 1 dtr, 1 son (both  grown)     Physical Exam  General:     Well-developed,well-nourished,in no acute distress; alert,appropriate and cooperative throughout examination. obese Head:     normocephalic and atraumatic.     Detailed Back/Spine Exam  General:    obese.    Gait:    Normal heel-toe gait pattern bilaterally.    Skin:    Intact, no scars, lesions, rashes, cafe'-au-lait spots, or bruising.    Inspection:    No deformity, ecchymosis or swelling.   Palpation:    non-tender to palpation over distal leg, medial and lateral ankle, distal achilles tendon, medial and lateral hindfoot, posterior heel, plantar heel, midfoot and forefoot bilaterally.   Lumbosacral Exam:  Inspection-deformity:    Normal Palpation-spinal tenderness:  Normal Squatting:      patient in too much pain to due ROM tests Lying Straight Leg Raise:  Right:  negative    Left:  negative Sitting Straight Leg Raise:    Right:  negative    Left:  negative Reverse Straight Leg Raise:    Right:  negative    Left:  negative Contralateral Straight Leg Raise:    Right:  negative    Left:  negative Sciatic Notch:    There is no sciatic notch tenderness. Toe Walking:    Right:  normal    Left:  normal Heel Walking:    Right:  normal    Left:  normal Patrick's Maneuver:    Right:  positive    Left:  positive Fabere Test:    Right:  positive    Left:  positive    Impression & Recommendations:  Problem # 1:  BACK PAIN (ICD-724.5) Assessment: New Low back pain. Likely musckulosketal and is acute. Recommened light activity only until feeling better. also continue aleve q 8 hrs for pain and inflammation x 5 days. tramdol for breakthrough (#30). Flexeril for spasms at night (#10).  Patient to call if not significantly better in 2 weeks. Injury likely provoked by body habitus. Orders: [facility]- Est Level  3 (29562)   Complete Medication List: 1)  Lisinopril 10 Mg Tabs (Lisinopril) .Marland Kitchen.. 1 tablet by mouth daily 2)   Hydrochlorothiazide 25 Mg Tabs (Hydrochlorothiazide) .Marland Kitchen.. 1 tablet by mouth daily 3)  Paxil 20 Mg Tabs (Paroxetine hcl) .Marland Kitchen.. 1 tablet by mouth daily 4)  Pravastatin Sodium 40 Mg Tabs (Pravastatin sodium) .Marland Kitchen.. 1 tablet by mouth at bedtime for cholesterol   Patient Instructions: 1)  you have a low back strain 2)  continue walking and do not rest in bed more than 12 hours 3)  take the flexeril at night to help relax your muscles, do not drive after taking this 4)  take one alleve three times a day with food for 5 days 5)  if you have additional pain you may take tramadol but do not drive if you take this 6)  if your pain is not significantly better in 2 weeks call us    ]

## 2010-02-17 NOTE — Miscellaneous (Signed)
  Clinical Lists Changes  Problems: Added new problem of OBESITY (ICD-278.00) Added new problem of DEPRESSION (ICD-311) Added new problem of HYPERLIPIDEMIA (ICD-272.4) Added new problem of DIVERTICULITIS, COLON (ICD-562.11) Added new problem of HEMORRHOIDS (ICD-455.6) Added new problem of ANEMIA, SECONDARY TO ACUTE BLOOD LOSS (ICD-285.1) Added new problem of HYPERTENSION, BENIGN ESSENTIAL (ICD-401.1) Observations: Added new observation of FAMILY HX: Mom - HTN, melanoma, deceased June 23, 2005Dad - HTN, DM sister - diabetes, HTN (05/26/2006 8:59) Added new observation of SOCIAL HX: works for [employer], no tob, no EtOH lives alone; adult dtr temporarily stays with her enjoys yard Airline pilot, movies, walking has 1 dtr, 1 son (both grown) (05/26/2006 8:59) Added new observation of PAST SURG HX: 1995- hysterectomy for fibroids  (05/26/2006 8:59)     Past Surgical History:    1995- hysterectomy for fibroids   Family History:    Mom - HTN, melanoma, deceased 07-11-2003   Dad - HTN, DM    sister - diabetes, HTN  Social History:    works for [employer], no tob, no EtOH    lives alone; adult dtr temporarily stays with her    enjoys yard Airline pilot, movies, walking    has 1 dtr, 1 son (both grown)

## 2010-02-17 NOTE — Progress Notes (Signed)
Summary: Rx Prob   Phone Note Call from Patient Call back at Home Phone [phone]   Caller: Patient Summary of Call: pt calling about the Paxil she can not get the 40 mg on the 4 dollar plan but she can get the 20 mg on the 4 dollar plan.  Pt uses [facility] [address] Initial call taken by: Clydell Hakim,  September 25, 2008 2:16 PM  Follow-up for Phone Call        will forward message to MD . Follow-up by: Theresia Lo RN,  September 25, 2008 2:55 PM  Additional Follow-up for Phone Call Additional follow up Details #1::        please see answer from 9/7 Additional Follow-up by: Helane Rima MD,  September 25, 2008 6:42 PM     Appended Document: Rx Prob message left on voicemail to return call.  Appended Document: Rx Prob spoke with patient and she would  like to continue on the Paxil, she is pleased with the way the paxil works. will send message to MD.  Appended Document: Orders Update Medications Added PAXIL 20 MG TABS (PAROXETINE HCL) take 2 by mouth daily          Clinical Lists Changes  Medications: Changed medication from PAXIL 40 MG TABS (PAROXETINE HCL) take one by mouth daily to PAXIL 20 MG TABS (PAROXETINE HCL) take 2 by mouth daily - Signed Rx of PAXIL 20 MG TABS (PAROXETINE HCL) take 2 by mouth daily;  #60 x 6;  Signed;  Entered by: Helane Rima MD;  Authorized by: Helane Rima MD;  Method used: Electronically to [facility] [phone]*, [address] [place], [place]  [address], Ph: [phone], Fax: [phone]    Prescriptions: PAXIL 20 MG TABS (PAROXETINE HCL) take 2 by mouth daily  #60 x 6   Entered and Authorized by:   Helane Rima MD   Signed by:   Helane Rima MD on 09/27/2008   Method used:   Electronically to        [facility] [phone]* (retail)       [address]       [place], [place]  [address]       Ph: [phone]       Fax: [phone]   RxID:   [id number]    Appended Document: Rx Prob please lett the  patient know that I changed her prescription to Paxil 20 mg to take 2 by mouth daily.  Appended Document: Rx Prob message left on voicemail that RX has been sent in

## 2010-02-17 NOTE — Assessment & Plan Note (Signed)
Summary: knee pain,df   Vital Signs:  Patient profile:   60 year old female Weight:      218.5 pounds Pulse rate:   92 / minute BP sitting:   134 / 80  (right arm) Cuff size:   large  Vitals Entered By: Arlyss Repress CMA, (March 24, 2009 1:33 PM) CC: left knee pain x 2 months. pain up to 8/10 with some movements. denies injury Is Patient Diabetic? No Pain Assessment Patient in pain? no        Primary Care Provider:  Helane Rima DO  CC:  left knee pain x 2 months. pain up to 8/10 with some movements. denies injury.  History of Present Illness: 60 year old AAF with:  1. Left knee pain: x 2 months, intermittent, worse with walking and in am, sometimes has to stop walking 2/2 to locking/popping, sometimes back of knee swells after walking, + FamHx arthritis, unknown type. she does have multiple joints that are painful/swollen, including DIP/PIP of both hands, thumb, neck, and low back. she has been taking Alleve for the pain which does take the edge off.  Habits & Providers  Alcohol-Tobacco-Diet     Tobacco Status: never     Passive Smoke Exposure: no  Current Medications (verified): 1)  Lisinopril 20 Mg Tabs (Lisinopril) .Marland Kitchen.. 1 Tablet By Mouth Daily For Blood Pressure 2)  Hydrochlorothiazide 25 Mg  Tabs (Hydrochlorothiazide) .Marland Kitchen.. 1 Tablet By Mouth Daily 3)  Paxil 20 Mg Tabs (Paroxetine Hcl) .... Take 2 By Mouth Daily 4)  Pravastatin Sodium 40 Mg  Tabs (Pravastatin Sodium) .Marland Kitchen.. 1 Tablet By Mouth At Bedtime For Cholesterol 5)  Meloxicam 15 Mg Tabs (Meloxicam) .... 1/2 To 1 By Mouth Daily 6)  Hydrocodone-Acetaminophen 5-325 Mg Tabs (Hydrocodone-Acetaminophen) .Marland Kitchen.. 1 By Mouth Up To 4 Times Per Day As Needed For Severe Pain  Allergies (verified): No Known Drug Allergies PMH-FH-SH reviewed for relevance  Social History: Passive Smoke Exposure:  no  Review of Systems General:  Denies chills, fever, and malaise. MS:  Complains of joint pain and joint swelling; denies joint  redness, loss of strength, low back pain, muscle aches, muscle weakness, and stiffness. Derm:  Denies lesion(s) and rash.  Physical Exam  General:  Well-developed, well-nourished, in no acute distress; alert, appropriate and cooperative throughout examination. Vitals reviewed. Msk:  Left Knee: both knees enlarged c/w arthritis changes, left knee without effusion, bruise, or other obvious deformity. Mild ttp along joint line. + crepitus. Good endpoints. Good ROM. Neg McMurray. Hands: Bilateral DIP, PIP enlargement.   Extremities:  No edema.   Impression & Recommendations:  Problem # 1:  DEGENERATIVE JOINT DISEASE (ICD-715.90) Assessment New History and exam c/w arthritis. Patient has multiple sffected joints, including bilateral hands (DIP, PIP, thumb, ? MCPs), lower back, knees, neck. May be beneficial to check RA labs.She endorses am stiffness. + FamHx arthritis, unkown type. Will Rx Mobic for pain control with Vicodin for breakthrough pain. Patient does not want joint injection at this time. Her updated medication list for this problem includes:    Meloxicam 15 Mg Tabs (Meloxicam) .Marland Kitchen... 1/2 to 1 by mouth daily    Hydrocodone-acetaminophen 5-325 Mg Tabs (Hydrocodone-acetaminophen) .Marland Kitchen... 1 by mouth up to 4 times per day as needed for severe pain  Orders: [facility]- Est Level  3 (99213)Future Orders: Vit D, 25 [place]-[facility] ([id number]) ... 03/27/2010 Sed Rate (ESR)-[facility] [id number]) ... 03/27/2010 CRP, high sensitivity-[facility] ([id number]) ... 03/27/2010 Rheum Fact-[facility] ([id number]) ... 03/27/2010  Complete Medication List: 1)  Lisinopril 20 Mg Tabs (Lisinopril) .Marland Kitchen.. 1 tablet by mouth daily for blood pressure 2)  Hydrochlorothiazide 25 Mg Tabs (Hydrochlorothiazide) .Marland Kitchen.. 1 tablet by mouth daily 3)  Paxil 20 Mg Tabs (Paroxetine hcl) .... Take 2 by mouth daily 4)  Pravastatin Sodium 40 Mg Tabs (Pravastatin sodium) .Marland Kitchen.. 1 tablet by mouth at bedtime for cholesterol 5)  Meloxicam 15 Mg Tabs (Meloxicam) .... 1/2 to 1  by mouth daily 6)  Hydrocodone-acetaminophen 5-325 Mg Tabs (Hydrocodone-acetaminophen) .Marland Kitchen.. 1 by mouth up to 4 times per day as needed for severe pain  Other Orders: Future Orders: Comp Met-[facility] ([id number]) ... 03/20/2010 Lipid-[facility] ([id number]) ... 03/27/2010 Diagnostic X-Ray/Fluoroscopy (Diagnostic X-Ray/Flu) ... 03/20/2010  Patient Instructions: 1)  It was very nice to see you today!  2)  I would like to check some labs and xrays once you have your [facility] card.Marland Kitchen 3)  Let me know if you have any problems or questions. Prescriptions: HYDROCODONE-ACETAMINOPHEN 5-325 MG TABS (HYDROCODONE-ACETAMINOPHEN) 1 by mouth up to 4 times per day as needed for severe pain  #30 x 0   Entered and Authorized by:   Helane Rima DO   Signed by:   Helane Rima DO on 03/24/2009   Method used:   Print then Give to Patient   RxID:   [id number] MELOXICAM 15 MG TABS (MELOXICAM) 1/2 to 1 by mouth daily  #90 x 3   Entered and Authorized by:   Helane Rima DO   Signed by:   Helane Rima DO on 03/24/2009   Method used:   Electronically to        [facility] [phone]* (retail)       [address]       [place], [place]  [address]       Ph: [phone]       Fax: [phone]   RxID:   [id number]   Prevention & Chronic Care Immunizations   Influenza vaccine: Not documented    Tetanus booster: 08/18/2001: Done.   Tetanus booster due: 08/19/2011    Pneumococcal vaccine: Not documented    H. zoster vaccine: Not documented  Colorectal Screening   Hemoccult: Done.  (11/23/2003)   Hemoccult due: Not Indicated    Colonoscopy: Done.  (01/18/2005)   Colonoscopy due: 01/19/2015  Other Screening   Pap smear: Done.  (08/18/2001)   Pap smear due: Not Indicated    Mammogram: ASSESSMENT: Negative - BI-RADS 1^MS DIGITAL SCREENING  (10/23/2008)   Mammogram due: 05/2007    DXA bone density scan: Not documented   Smoking status: never  (03/24/2009)  Lipids   Total Cholesterol:  219  (12/20/2007)   LDL: 143  (12/20/2007)   LDL Direct: Not documented   HDL: 56  (12/20/2007)   Triglycerides: 101  (12/20/2007)    SGOT (AST): 15  (12/20/2007)   SGPT (ALT): 11  (12/20/2007) CMP ordered    Alkaline phosphatase: 93  (12/20/2007)   Total bilirubin: 0.3  (12/20/2007)    Lipid flowsheet reviewed?: Yes   Progress toward LDL goal: Unchanged  Hypertension   Last Blood Pressure: 134 / 80  (03/24/2009)   Serum creatinine: 0.86  (12/20/2007)   Serum potassium 3.5  (12/20/2007) CMP ordered     Hypertension flowsheet reviewed?: Yes   Progress toward BP goal: At goal  Self-Management Support :   Personal Goals (by the next clinic visit) :      Personal blood pressure goal: 140/90  (03/24/2009)     Personal LDL goal: 130  (  03/24/2009)    Patient will work on the following items until the next clinic visit to reach self-care goals:     Medications and monitoring: take my medicines every day, bring all of my medications to every visit  (03/24/2009)     Eating: drink diet soda or water instead of juice or soda, eat more vegetables, use fresh or frozen vegetables, eat foods that are low in salt, eat baked foods instead of fried foods, eat fruit for snacks and desserts, limit or avoid alcohol  (03/24/2009)    Hypertension self-management support: Written self-care plan  (03/24/2009)   Hypertension self-care plan printed.    Lipid self-management support: Written self-care plan  (03/24/2009)   Lipid self-care plan printed.

## 2010-02-17 NOTE — Progress Notes (Signed)
Summary: refill   Phone Note Refill Request Call back at Home Phone [phone] Message from:  Patient  Refills Requested: Medication #1:  HYDROCHLOROTHIAZIDE 25 MG  TABS 1 tablet by mouth daily   Dosage confirmed as above?Dosage Confirmed   Supply Requested: 1 month   Last Refilled: 01/06/2008   Notes: Patient must make appointment before next refill.  Medication #2:  LISINOPRIL 20 MG TABS 1 tablet by mouth daily for blood pressure   Dosage confirmed as above?Dosage Confirmed   Supply Requested: 1 month   Last Refilled: 01/06/2008   Notes: Patient must make appointment before next refill. [facility] [address]  Initial call taken by: De Nurse,  July 17, 2008 2:01 PM  Follow-up for Phone Call        Patient must make appointment before next refill. Follow-up by: Helane Rima MD,  July 17, 2008 9:43 PM      Prescriptions: HYDROCHLOROTHIAZIDE 25 MG  TABS (HYDROCHLOROTHIAZIDE) 1 tablet by mouth daily  #30 x 0   Entered and Authorized by:   Helane Rima MD   Signed by:   Helane Rima MD on 07/17/2008   Method used:   Electronically to        [facility] [phone]* (retail)       [address]       [place], [place]  [address]       Ph: [phone]       Fax: [phone]   RxID:   [id number] LISINOPRIL 20 MG TABS (LISINOPRIL) 1 tablet by mouth daily for blood pressure  #30 x 0   Entered and Authorized by:   Helane Rima MD   Signed by:   Helane Rima MD on 07/17/2008   Method used:   Electronically to        [facility] [phone]* (retail)       [address]       [place], [place]  [address]       Ph: [phone]       Fax: [phone]   RxID:   [id number]

## 2010-06-12 ENCOUNTER — Encounter: Payer: Self-pay | Admitting: Family Medicine

## 2010-06-12 ENCOUNTER — Ambulatory Visit (INDEPENDENT_AMBULATORY_CARE_PROVIDER_SITE_OTHER): Payer: Self-pay | Admitting: Family Medicine

## 2010-06-12 DIAGNOSIS — E785 Hyperlipidemia, unspecified: Secondary | ICD-10-CM

## 2010-06-12 DIAGNOSIS — R7303 Prediabetes: Secondary | ICD-10-CM

## 2010-06-12 DIAGNOSIS — G609 Hereditary and idiopathic neuropathy, unspecified: Secondary | ICD-10-CM

## 2010-06-12 DIAGNOSIS — M1990 Unspecified osteoarthritis, unspecified site: Secondary | ICD-10-CM

## 2010-06-12 DIAGNOSIS — G629 Polyneuropathy, unspecified: Secondary | ICD-10-CM | POA: Insufficient documentation

## 2010-06-12 DIAGNOSIS — I10 Essential (primary) hypertension: Secondary | ICD-10-CM

## 2010-06-12 DIAGNOSIS — R7309 Other abnormal glucose: Secondary | ICD-10-CM

## 2010-06-12 DIAGNOSIS — D649 Anemia, unspecified: Secondary | ICD-10-CM

## 2010-06-12 MED ORDER — GABAPENTIN 100 MG PO CAPS: 100.0000 mg | ORAL_CAPSULE | Freq: Every day | ORAL | Status: DC

## 2010-06-12 NOTE — Progress Notes (Signed)
  Subjective:    Patient ID: Jasmine Gregory, female    DOB: 1950/10/01, 60 y.o.   MRN: [id number]  HPI  1. Foot Numbness/Tingling. Bilateral. For several weeks, worsened last week. Worst with walking, but still feels tingly with sitting. Hx borderline DM. Nonsmoker. No ETOH. Hx LBP and OA bilateral knees - pain intermittent, worse with exercising and patient has been trying to walk more, takes Naproxen daily with prn Norco (using 1-2 per week).   Review of Systems SEE ROS.    Objective:   Physical Exam  Vitals reviewed. Constitutional: She appears well-developed and well-nourished. No distress.  Neck: Normal range of motion. Neck supple. No thyromegaly present.  Cardiovascular: Normal rate, regular rhythm, normal heart sounds and intact distal pulses.   Musculoskeletal:       Lumbar back: She exhibits normal range of motion, no tenderness, no bony tenderness, no swelling and no spasm.  Neurological: She has normal strength and normal reflexes. No sensory deficit.      Assessment & Plan:

## 2010-06-12 NOTE — Assessment & Plan Note (Signed)
DDx: Neuropathic (from back or DM), PVD - unlikely with good exam, B12 low. Will check labs and Rx Neurontin as it will treat pain for several etiologies. PE not concerning for PVD or any red flags from LBP - will not order imaging today. No Hx OP, fall, steroid use, B/B incontinence, saddle anesthesia.

## 2010-06-12 NOTE — Patient Instructions (Signed)
It was nice to see you today.  Come back for labs.

## 2010-06-16 ENCOUNTER — Other Ambulatory Visit: Payer: Self-pay

## 2010-06-17 ENCOUNTER — Other Ambulatory Visit (INDEPENDENT_AMBULATORY_CARE_PROVIDER_SITE_OTHER): Payer: Self-pay

## 2010-06-17 DIAGNOSIS — I10 Essential (primary) hypertension: Secondary | ICD-10-CM

## 2010-06-17 DIAGNOSIS — E785 Hyperlipidemia, unspecified: Secondary | ICD-10-CM

## 2010-06-17 DIAGNOSIS — R7309 Other abnormal glucose: Secondary | ICD-10-CM

## 2010-06-17 DIAGNOSIS — D649 Anemia, unspecified: Secondary | ICD-10-CM

## 2010-06-17 DIAGNOSIS — R7303 Prediabetes: Secondary | ICD-10-CM

## 2010-06-17 DIAGNOSIS — G629 Polyneuropathy, unspecified: Secondary | ICD-10-CM

## 2010-06-17 LAB — COMPREHENSIVE METABOLIC PANEL
ALT: 10 U/L (ref 0–35)
AST: 14 U/L (ref 0–37)
Albumin: 4 g/dL (ref 3.5–5.2)
Alkaline Phosphatase: 78 U/L (ref 39–117)
BUN: 16 mg/dL (ref 6–23)
CO2: 32 mEq/L (ref 19–32)
Calcium: 9.5 mg/dL (ref 8.4–10.5)
Chloride: 100 mEq/L (ref 96–112)
Creat: 0.86 mg/dL (ref 0.40–1.20)
Glucose, Bld: 106 mg/dL — ABNORMAL HIGH (ref 70–99)
Potassium: 3.7 mEq/L (ref 3.5–5.3)
Sodium: 139 mEq/L (ref 135–145)
Total Bilirubin: 0.3 mg/dL (ref 0.3–1.2)
Total Protein: 7.2 g/dL (ref 6.0–8.3)

## 2010-06-17 LAB — CBC
HCT: 34 % — ABNORMAL LOW (ref 36.0–46.0)
Hemoglobin: 10.6 g/dL — ABNORMAL LOW (ref 12.0–15.0)
MCH: 26.8 pg (ref 26.0–34.0)
MCHC: 31.2 g/dL (ref 30.0–36.0)
MCV: 86.1 fL (ref 78.0–100.0)
Platelets: 352 10*3/uL (ref 150–400)
RBC: 3.95 MIL/uL (ref 3.87–5.11)
RDW: 14.7 % (ref 11.5–15.5)
WBC: 6.3 10*3/uL (ref 4.0–10.5)

## 2010-06-17 LAB — LIPID PANEL
Cholesterol: 169 mg/dL (ref 0–200)
HDL: 45 mg/dL (ref >39)
LDL Cholesterol: 104 mg/dL — ABNORMAL HIGH (ref 0–99)
Total CHOL/HDL Ratio: 3.8 Ratio
Triglycerides: 101 mg/dL (ref <150)
VLDL: 20 mg/dL (ref 0–40)

## 2010-06-17 LAB — VITAMIN B12: Vitamin B-12: 153 pg/mL — ABNORMAL LOW (ref 211–911)

## 2010-06-17 LAB — VITAMIN D 25 HYDROXY (VIT D DEFICIENCY, FRACTURES): Vit D, 25-Hydroxy: 17 ng/mL — ABNORMAL LOW (ref 30–89)

## 2010-06-17 LAB — POCT GLYCOSYLATED HEMOGLOBIN (HGB A1C): Hemoglobin A1C: 6.3

## 2010-06-17 NOTE — Progress Notes (Signed)
Addended by: [place], Gaylene Moylan on: 06/17/2010 06:03 PM   Modules accepted: Orders

## 2010-06-17 NOTE — Progress Notes (Signed)
Labs done today Jasmine Gregory 

## 2010-06-24 ENCOUNTER — Other Ambulatory Visit: Payer: Self-pay | Admitting: Family Medicine

## 2010-07-27 ENCOUNTER — Other Ambulatory Visit: Payer: Self-pay | Admitting: Family Medicine

## 2010-07-27 MED ORDER — PAROXETINE HCL 20 MG PO TABS: 40.0000 mg | ORAL_TABLET | ORAL | Status: DC

## 2010-09-08 ENCOUNTER — Encounter: Payer: Self-pay | Admitting: Family Medicine

## 2010-09-08 ENCOUNTER — Ambulatory Visit (INDEPENDENT_AMBULATORY_CARE_PROVIDER_SITE_OTHER): Payer: Self-pay | Admitting: Family Medicine

## 2010-09-08 DIAGNOSIS — M549 Dorsalgia, unspecified: Secondary | ICD-10-CM

## 2010-09-08 MED ORDER — GABAPENTIN 600 MG PO TABS: 300.0000 mg | ORAL_TABLET | Freq: Every day | ORAL | Status: DC

## 2010-09-08 MED ORDER — TRAMADOL HCL 50 MG PO TABS: 50.0000 mg | ORAL_TABLET | Freq: Three times a day (TID) | ORAL | Status: AC | PRN

## 2010-09-08 NOTE — Progress Notes (Signed)
  Subjective:    Patient ID: Jasmine Gregory, female    DOB: [birth year], 60 y.o.   MRN: [id number]  HPI Back pain: Off and on x 1 year.  Has been given pain med in past, but now out.  Lower back, typical location for back pain.  Had some numbness in left leg off on. Aleve not helping pain.  neurotin has helped the numbness in left lower leg.  4 months ago was placed on neurotin for this.  Nothing makes back pain better.  Walking makes it worse.  No recent falls. No fever.  No problems with bowel or bladder incontinence.  Unable to sleep well because of the pain.     Review of Systems As per above.    Objective:   Physical Exam  Constitutional: She appears well-developed and well-nourished.  Cardiovascular: Normal rate, regular rhythm and normal heart sounds.   No murmur heard. Pulmonary/Chest: Effort normal. No respiratory distress.  Musculoskeletal:        Back - Normal skin, Spine with normal alignment and no deformity.  No tenderness to vertebral process palpation.  Paraspinous muscles are tender bilateral.  Strength 5/5 in all 4 ext.  Sensation grossly intact.  Reflexes equal bilateral.           Assessment & Plan:

## 2010-09-08 NOTE — Patient Instructions (Signed)
Please return to see Dr. Denyse Amass in 2 weeks for follow up for back pain.

## 2010-09-09 ENCOUNTER — Other Ambulatory Visit: Payer: Self-pay | Admitting: Family Medicine

## 2010-09-09 MED ORDER — LISINOPRIL 20 MG PO TABS: 20.0000 mg | ORAL_TABLET | Freq: Every day | ORAL | Status: DC

## 2010-09-09 MED ORDER — HYDROCHLOROTHIAZIDE 25 MG PO TABS: 25.0000 mg | ORAL_TABLET | Freq: Every day | ORAL | Status: DC

## 2010-09-10 DIAGNOSIS — M545 Low back pain, unspecified: Secondary | ICD-10-CM | POA: Insufficient documentation

## 2010-09-10 NOTE — Assessment & Plan Note (Signed)
No red flags on exam.  This is most likely an exacerbation of a chronic issue.  Pt to increase neurotin to 300mg  po bid.  Pt to take tramadol prn for breakthrough pain.  Can take tylenol scheduled bid.

## 2011-01-28 ENCOUNTER — Ambulatory Visit (INDEPENDENT_AMBULATORY_CARE_PROVIDER_SITE_OTHER): Payer: Self-pay | Admitting: Family Medicine

## 2011-01-28 ENCOUNTER — Encounter: Payer: Self-pay | Admitting: Family Medicine

## 2011-01-28 DIAGNOSIS — M549 Dorsalgia, unspecified: Secondary | ICD-10-CM

## 2011-01-28 DIAGNOSIS — H8110 Benign paroxysmal vertigo, unspecified ear: Secondary | ICD-10-CM

## 2011-01-28 DIAGNOSIS — M23302 Other meniscus derangements, unspecified lateral meniscus, unspecified knee: Secondary | ICD-10-CM

## 2011-01-28 DIAGNOSIS — M25569 Pain in unspecified knee: Secondary | ICD-10-CM

## 2011-01-28 MED ORDER — LISINOPRIL-HYDROCHLOROTHIAZIDE 20-25 MG PO TABS: 1.0000 | ORAL_TABLET | Freq: Every day | ORAL | Status: DC

## 2011-01-28 NOTE — Patient Instructions (Addendum)
Thank you for coming in today. 1) Back pain: See therapy instructions below. Use tylenol arthritis as needed. Stay active.  2) Knee Pain: Two diagnoses. Lateral Meniscus pain. The shot should help wit.  Patello Femoral Pain syndrome: See instructios below.   To the straight leg raises and inside leg raises 20 times three times a day.    3) Diziness: This is benign paroxysmal positional vertigo. Go to google and search for Canalith Repositioning (Epley) Maneuver BPPV, Watch the videos.  Try that technique several times. It should help.  I combined your lisinopril and hydrochlorothiazide to save you some money.   See me in 2-3 months for follow up.    Patellofemoral Syndrome If you have had pain in the front of your knee for a long time, chances are good that you have patellofemoral syndrome. The word patella refers to the kneecap. Femoral (or femur) refers to the thigh bone. That is the bone the kneecap sits on. The kneecap is shaped like a triangle. Its job is to protect the knee and to improve the efficiency of your thigh muscles (quadriceps). The underside of the kneecap is made of smooth tissue (cartilage). This lets the kneecap slide up and down as the knee moves. Sometimes this cartilage becomes soft. Your healthcare provider may say the cartilage breaks down. That is patellofemoral syndrome. It can affect one knee, or both. The condition is sometimes called patellofemoral pain syndrome. That is because the condition is painful. The pain usually gets worse with activity. Sitting for a long time with the knee bent also makes the pain worse. It usually gets better with rest and proper treatment. CAUSES   No one is sure why some people develop this problem and others do not. Runners often get it. One name for the condition is "runner's knee." However, some people run for years and never have knee pain. Certain things seem to make patellofemoral syndrome more likely. They include:  Moving out of  alignment. The kneecap is supposed to move in a straight line when the thigh muscle pulls on it. Sometimes the kneecap moves in poor alignment. That can make the knee swell and hurt. Some experts believe it also wears down the cartilage.     Injury to the kneecap.     Strain on the knee. This may occur during sports activity. Soccer, running, skiing and cycling can put excess stress on the knee.     Being flat-footed or knock-kneed.  SYMPTOMS    Knee pain.     Pain under the kneecap. This is usually a dull, aching pain.     Pain in the knee when doing certain things: squatting, kneeling, going up or down stairs.     Pain in the knee when you stand up after sitting down for awhile.     Tightness in the knee.     Loss of muscle strength in the thigh.     Swelling of the knee.  DIAGNOSIS   Healthcare providers often send people with knee pain to an orthopedic caregiver. This person has special training to treat problems with bones and joints. To decide what is causing your knee pain, your caregiver will probably:  Do a physical exam. This will probably include:     Asking about symptoms you have noticed.     Asking about your activities and any injuries.     Feeling your knee. Moving it. This will help test the knee's strength. It will also check alignment (whether  the knee and leg are aligned normally).     Order some tests, such as:     Imaging tests. They create pictures of the inside of the knee. Tests may include:     X-rays.    Computed tomography (CT) scan. This uses X-rays and a computer to show more detail.     Magnetic resonance imaging (MRI). This test uses magnets, radio waves and a computer to make pictures.  TREATMENT    Medication is almost always used first. It can relieve pain. It also can reduce swelling. Non-steroidal anti-inflammatory medicines (called NSAIDs) are usually suggested. Sometimes a stronger form is needed. A stronger form would require a  prescription.     Other treatment may be needed after the swelling goes down. Possibilities include:     Exercise. Certain exercises can make the muscles around the knee stronger which decreases the pressure on the knee cap. This includes the thigh muscle. Certain exercises also may be suggested to increase your flexibility.     A knee brace. This gives the knee extra support and helps align the movement of the knee cap.     Orthotics. These are special shoe inserts. They can help keep your leg and knee aligned.     Surgery is sometimes needed. This is rare. Options include:     Arthroscopy. The surgeon uses a special tool to remove any damaged pieces of the kneecap. Only a few small incisions (cuts) are needed.     Realignment. This is open surgery. The goals are to reduce pressure and fix the way the kneecap moves.  HOME CARE INSTRUCTIONS    Take any medication prescribed by your healthcare provider. Follow the directions carefully.     If your knee is swollen:     Put ice or cold packs on it. Do this for 20 to 30 minutes, 3 to 4 times a day.     Keep the knee raised. Make sure it is supported. Put a pillow under it.     Rest your knee. For example, take the elevator instead of the stairs for awhile. Or, take a break from sports activity that strain your knee. Try walking or swimming instead.     Whenever you are active:     Use an elastic bandage on your knee. This gives it support.     After any activity, put ice or cold packs on your knees. Do this for about 10 to 20 minutes.     Make sure you wear shoes that give good support. Make sure they are not worn down. The heels should not slant in or out.  SEEK MEDICAL CARE IF:    Knee pain gets worse. Or it does not go away, even after taking pain medicine.     Swelling does not go down.     Your thigh muscle becomes weak.     You have an oral temperature above 102 F (38.9 C).  SEEK IMMEDIATE MEDICAL CARE IF:   You have  an oral temperature above 102 F (38.9 C), not controlled by medicine. Document Released: 12/23/2008 Document Revised: 09/16/2010 Document Reviewed: 12/23/2008 [facility] Patient Information 2012 [place], [place].

## 2011-01-29 DIAGNOSIS — M23302 Other meniscus derangements, unspecified lateral meniscus, unspecified knee: Secondary | ICD-10-CM | POA: Insufficient documentation

## 2011-01-29 DIAGNOSIS — H8110 Benign paroxysmal vertigo, unspecified ear: Secondary | ICD-10-CM

## 2011-01-29 DIAGNOSIS — M25569 Pain in unspecified knee: Secondary | ICD-10-CM | POA: Insufficient documentation

## 2011-01-29 HISTORY — DX: Benign paroxysmal vertigo, unspecified ear: H81.10

## 2011-01-29 NOTE — Assessment & Plan Note (Signed)
Her vertigo is consistent with benign paroxysmal positional vertigo.  Reviewed what this means the patient. Advised her to perform  epley maneuver.  Handout on how to do this provided to patient and instructions were to find this on the Internet also reviewed.  Followup if no improvement

## 2011-01-29 NOTE — Progress Notes (Signed)
Jasmine Gregory is a 61 year old woman who presents to clinic today to discuss back pain, knee pain, vertigo.  1) lumbar back pain present now for years off and on. She experiences some pain in her right lumbar back that radiates down her right posterior thigh. She has tried multiple therapies such as physical therapy water aerobics rest rehabilitation.  She is currently in an exacerbation for the last several days.  She denies any weakness numbness radiculopathy to her foot or bowel or bladder dysfunction.   2) right knee pain present now for the last several weeks however has been bad off and on for years.  She describes 2 processes.  A: Lateral knee pain present off and on associated with clicking locking and some catching. She denies any history of trauma.  She does have some knee effusions.  She's not had surgery on her knee nor can she afford any surgery at this time as she has no insurance.   B: Anterior knee pain. This is present especially when she rises from sitting for prolonged periods of time and climbing stairs.   3) vertigo: Present off and on for years however worse the last 2 days. She describes episodes of the room spinning for 30 seconds after some movements.  For example rolling over in bed or turning her head sharply will cause vertigo symptoms.  She denies any tinnitus or vomiting with these episodes.  PMH reviewed.  ROS as above otherwise neg Medications reviewed. Current Outpatient Prescriptions  Medication Sig Dispense Refill  . cetirizine (ZYRTEC) 10 MG tablet Take 10 mg by mouth daily.        . ergocalciferol (VITAMIN D2) 50000 UNIT capsule Take 50,000 Units by mouth once a week. To take x 12 wks       . ferrous sulfate 325 (65 FE) MG tablet Take 325 mg by mouth 2 (two) times daily.        . fluticasone (FLONASE) 50 MCG/ACT nasal spray 2 sprays by Nasal route daily. To both nostrils       . gabapentin (NEURONTIN) 600 MG tablet Take 0.5 tablets (300 mg total) by mouth at  bedtime. Take 1/2 tablet daily at bedtime for 1 week then increase to 2 x day.  20 tablet  2  . lisinopril-hydrochlorothiazide (PRINZIDE,ZESTORETIC) 20-25 MG per tablet Take 1 tablet by mouth daily.  90 tablet  3  . naproxen sodium (ANAPROX) 220 MG tablet 1 tab by mouth 2-3 times a day as needed for pain      . PARoxetine (PAXIL) 20 MG tablet Take 2 tablets (40 mg total) by mouth every morning. Take 2 tabs by mouth daily   60 tablet  3  . simvastatin (ZOCOR) 40 MG tablet Take 40 mg by mouth at bedtime.          Exam:  BP 147/79  Pulse 99  Temp(Src) 98.3 F (36.8 C) (Oral)  Ht 5' 4.75" (1.645 m)  Wt 213 lb (96.616 kg)  BMI 35.72 kg/m2 Gen: Well NAD HEENT: EOMI,  MMM, normal ears bilaterally. Lungs: CTABL Nl WOB Heart: RRR no MRG Abd: NABS, NT, ND Exts: Non edematous BL  LE, warm and well perfused.  MSK: Back: Nontender over spinal midline. Right SI joint tenderness to palpation. Gait is normal strength is intact sensation intact patient able to get onto and off exam table by herself.   Right knee: Mild effusion. Some crepitations to extension. Tenderness to palpation of the lateral joint line. Nontender medially. Positive  McMurray's test laterally negative Lachman's normal valgus and varus stress.  Positive patellar grind test.  Reduced VMO bulk.     Procedure note:  Consent obtained and timeout performed. Right knee palpated and site of injection plan for the area lateral to the patella tendon.  The site marked with the depression and the skin.  Area cleaned with Betadine. Alcohol use and ethylene  chloride spray applied.  Using 25-gauge 1.5 inch needle 80 mg of Kenalog and 4 mL of bupivacaine were injected into the knee. No bleeding patient tolerated the procedure well.    Pain reduced 10 minutes following the injection.

## 2011-01-29 NOTE — Assessment & Plan Note (Signed)
Her pain is consistent with patellofemoral pain syndrome.  Rehabilitation instructions reviewed with patient.  She expresses understanding. We'll followup in a few weeks.

## 2011-01-29 NOTE — Assessment & Plan Note (Signed)
Pain of the right SI joint.  This is consistent with chronic musculoskeletal back pain. Plan rehabilitation exercises and Tylenol scheduled. We'll follow this issue up in a few weeks if no improvement. No red flag signs or symptoms today. I reviewed red flag symptoms with patient and she expresses understanding.

## 2011-01-29 NOTE — Assessment & Plan Note (Signed)
Based on positive McMurray's test and tenderness to palpation of the right lateral joint line.   Performed a unguided knee injection today to attempt to reduce inflammation of the right lateral meniscus.  Will follow patient up in a few weeks, I expect some improvement. Ultimately she may benefit from surgery when she can afford it.

## 2011-02-02 ENCOUNTER — Telehealth: Payer: Self-pay | Admitting: Family Medicine

## 2011-02-02 NOTE — Telephone Encounter (Signed)
Jasmine Gregory need you to provide a letter stating the medical conditions she have that would warrant her obtaining her disability.  This is a request she is making on behalf of the attorney who is representing her.  Please call patient back if there are any questions regarding this.

## 2011-02-05 NOTE — Telephone Encounter (Signed)
Called the patient. She is unsure of what exactly she needs. I asked her to contact her lawyer who can provide specifics. She will send me a fax.

## 2011-02-15 ENCOUNTER — Encounter: Payer: Self-pay | Admitting: Family Medicine

## 2011-02-15 ENCOUNTER — Ambulatory Visit (INDEPENDENT_AMBULATORY_CARE_PROVIDER_SITE_OTHER): Payer: Self-pay | Admitting: Family Medicine

## 2011-02-15 DIAGNOSIS — M1990 Unspecified osteoarthritis, unspecified site: Secondary | ICD-10-CM

## 2011-02-15 NOTE — Assessment & Plan Note (Signed)
Filled out paperwork for disability with patient.  Followup in one to 2 months after trying Tylenol

## 2011-02-15 NOTE — Progress Notes (Signed)
Jasmine Gregory presents to clinic today to fill out paperwork for social security disability.  Reason for disability due to arthritis BL knee and lower back and BL hand pain.   1) Right Knee: Osteo Arthritis started having pain for 1.5 years. No treatment aside from oral pain medications.  Pain every day.   2) Left Knee: Osteo Arthritis painful for around 1.5 years.   3) Lower back: Pain: Presumed to be due to arthritis for over 1 year.  Pain every day.   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  Have hand pain most days.   Ability: Can walk about 30 minutes before you have to stop.  Can stand for about 20 minutes before need to sit.  Can sit for about 45 minutes before she needs to stretch.  Cannot type.  Cannot do fine motor activity without pain.  Cannot write for more than 15 mins with a pen.   House work: Journalist, newspaper and clean and vacuum. Daughter helps.   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  During job was having pain with typing and sitting.   PMH reviewed.  ROS as above otherwise neg Medications reviewed. Current Outpatient Prescriptions  Medication Sig Dispense Refill  . cetirizine (ZYRTEC) 10 MG tablet Take 10 mg by mouth daily.        . ergocalciferol (VITAMIN D2) 50000 UNIT capsule Take 50,000 Units by mouth once a week. To take x 12 wks       . ferrous sulfate 325 (65 FE) MG tablet Take 325 mg by mouth 2 (two) times daily.        . fluticasone (FLONASE) 50 MCG/ACT nasal spray 2 sprays by Nasal route daily. To both nostrils       . gabapentin (NEURONTIN) 600 MG tablet Take 0.5 tablets (300 mg total) by mouth at bedtime. Take 1/2 tablet daily at bedtime for 1 week then increase to 2 x day.  20 tablet  2  . lisinopril-hydrochlorothiazide (PRINZIDE,ZESTORETIC) 20-25 MG per tablet Take 1 tablet by mouth daily.  90 tablet  3  . naproxen sodium (ANAPROX) 220 MG tablet 1 tab by mouth 2-3 times a day as needed for  pain      . PARoxetine (PAXIL) 20 MG tablet Take 2 tablets (40 mg total) by mouth every morning. Take 2 tabs by mouth daily   60 tablet  3  . simvastatin (ZOCOR) 40 MG tablet Take 40 mg by mouth at bedtime.          Exam:  BP 138/80  Pulse 96  Ht 5' 4.75" (1.645 m)  Wt 213 lb (96.616 kg)  BMI 35.72 kg/m2 Gen: Well NAD Exts: Non edematous BL  LE, warm and well perfused.  MSK: Bilateral arthritic changes in DIP joints in both hand.  Knee decreased range of motion with crepitations. Gait normal

## 2011-04-12 ENCOUNTER — Other Ambulatory Visit: Payer: Self-pay | Admitting: Family Medicine

## 2011-04-12 MED ORDER — SIMVASTATIN 40 MG PO TABS: 40.0000 mg | ORAL_TABLET | Freq: Every day | ORAL | Status: DC

## 2011-07-15 ENCOUNTER — Ambulatory Visit (INDEPENDENT_AMBULATORY_CARE_PROVIDER_SITE_OTHER): Payer: Medicare Other | Admitting: Family Medicine

## 2011-07-15 ENCOUNTER — Encounter: Payer: Self-pay | Admitting: Family Medicine

## 2011-07-15 VITALS — BP 143/84 | HR 99 | Ht 64.75 in | Wt 219.0 lb

## 2011-07-15 DIAGNOSIS — M549 Dorsalgia, unspecified: Secondary | ICD-10-CM

## 2011-07-15 DIAGNOSIS — E538 Deficiency of other specified B group vitamins: Secondary | ICD-10-CM

## 2011-07-15 DIAGNOSIS — M130 Polyarthritis, unspecified: Secondary | ICD-10-CM

## 2011-07-15 DIAGNOSIS — E559 Vitamin D deficiency, unspecified: Secondary | ICD-10-CM

## 2011-07-15 LAB — VITAMIN B12: Vitamin B-12: 169 pg/mL — ABNORMAL LOW (ref 211–911)

## 2011-07-15 LAB — RHEUMATOID FACTOR: Rhuematoid fact SerPl-aCnc: <10 IU/mL (ref <=14)

## 2011-07-15 LAB — SEDIMENTATION RATE: Sed Rate: 40 mm/hr — ABNORMAL HIGH (ref 0–22)

## 2011-07-15 MED ORDER — TRAMADOL HCL 50 MG PO TABS: 50.0000 mg | ORAL_TABLET | Freq: Three times a day (TID) | ORAL | Status: AC | PRN

## 2011-07-15 NOTE — Progress Notes (Signed)
  Subjective:    Patient ID: Jasmine Gregory, female    DOB: 1950/03/22, 61 y.o.   MRN: [id number]  HPI  Back Pain: Location: right lumbar into right buttocks, she describes it as "hip pain"; also has polyarthritis that waxes and wanes over time  Duration: 1.5 weight Quality: 8/10 Preceding Events: no falls, no trauma Alleviating Factors improved with movement Exacerbating Factors: continued standing > 10 minutes or walking;  Medication Use: Has taken Alleve without relief, takes 2-3  Hx of intervention: no surgery, no PT, hx of cortisone injection into right hip several years ago Hx of imaging: no Red Flags: no falls, no numbness and tingling, no impaired bowel or bladder function   Review of Systems Negative unless stated above    Objective:   Physical Exam BP 143/84  Pulse 99  Ht 5' 4.75" (1.645 m)  Wt 219 lb (99.338 kg)  BMI 36.73 kg/m2 Gen: actively moving to get comfortable in chair when I walk in; elderly AAF, obese; non ill appearing MSK: tenderness to palpation of lumbar spine and right sacroiliac joint; pain with right straight leg extension > left; pain with right hip external rotation; Left hip with normal ROM; 4/5 strength of right hip flexion and right knee extension  Hands: Heberden's nodules present bilaterally      Assessment & Plan:  61 year old obese female with known DJD who presents with a flare of pain in her right hip and buttocks.

## 2011-07-15 NOTE — Patient Instructions (Addendum)
Dear Jasmine Gregory,   Thank you for coming to clinic today. Please read below regarding the issues that we discussed.   1. Hip Pain - I believe that this is an inflammation of your sacroiliac joint. Keep taking Alleve and try Tramadol as needed. I am also getting labs to check inflammation.   2. Low Vitamin D - I am re-checking your Vitamin D today since it hasn't been check since last year.   3. Low Vitamin B-12 - I am rechecking that today as well.   Please follow up in clinic in 2 weeks to go over your lab results. Please call earlier if you have any questions or concerns.   Sincerely,   Dr. Clinton Sawyer

## 2011-07-16 LAB — VITAMIN D 25 HYDROXY (VIT D DEFICIENCY, FRACTURES): Vit D, 25-Hydroxy: 14 ng/mL — ABNORMAL LOW (ref 30–89)

## 2011-07-19 LAB — ANTI-NUCLEAR AB-TITER (ANA TITER): ANA Titer 1: NEGATIVE

## 2011-07-19 LAB — ANA: Anti Nuclear Antibody(ANA): POSITIVE — AB

## 2011-07-20 ENCOUNTER — Encounter: Payer: Self-pay | Admitting: Family Medicine

## 2011-07-20 ENCOUNTER — Telehealth: Payer: Self-pay | Admitting: Family Medicine

## 2011-07-20 DIAGNOSIS — E559 Vitamin D deficiency, unspecified: Secondary | ICD-10-CM

## 2011-07-20 DIAGNOSIS — E538 Deficiency of other specified B group vitamins: Secondary | ICD-10-CM

## 2011-07-20 HISTORY — DX: Deficiency of other specified B group vitamins: E53.8

## 2011-07-20 HISTORY — DX: Vitamin D deficiency, unspecified: E55.9

## 2011-07-20 NOTE — Assessment & Plan Note (Signed)
Vitamin D was 17 in May 2012. Patient has not taken any Vitamin D supplement since that time. Check again and restart supplementation as needed.

## 2011-07-20 NOTE — Assessment & Plan Note (Signed)
Pain in right SI joint, which is consistent with previous presentation. Continue Alleve and Tramadol PRN. Consider referral to sports med for injection. Given handicap parking pass for 2 months duration. Also, I will check ESR, ANA, and RF given patient complains of polyarthritis and pain in the morning.

## 2011-07-20 NOTE — Assessment & Plan Note (Addendum)
Vitamin B12 was low at 153 in May 2012. No evidence of supplementation since that time. Recheck and supplement as needed.

## 2011-07-20 NOTE — Telephone Encounter (Signed)
Patient explained that Vitamin D and B12 are both very low. I will send her a letter with recommendations for supplementation.

## 2011-07-21 ENCOUNTER — Encounter: Payer: Self-pay | Admitting: Family Medicine

## 2011-09-16 ENCOUNTER — Other Ambulatory Visit: Payer: Self-pay | Admitting: Family Medicine

## 2011-09-16 DIAGNOSIS — G629 Polyneuropathy, unspecified: Secondary | ICD-10-CM

## 2011-09-16 MED ORDER — GABAPENTIN 600 MG PO TABS: 300.0000 mg | ORAL_TABLET | Freq: Two times a day (BID) | ORAL | Status: DC

## 2011-11-11 ENCOUNTER — Ambulatory Visit (INDEPENDENT_AMBULATORY_CARE_PROVIDER_SITE_OTHER): Payer: Medicare Other | Admitting: Family Medicine

## 2011-11-11 ENCOUNTER — Encounter: Payer: Self-pay | Admitting: Family Medicine

## 2011-11-11 VITALS — BP 138/87 | HR 100 | Ht 64.75 in | Wt 217.9 lb

## 2011-11-11 DIAGNOSIS — E559 Vitamin D deficiency, unspecified: Secondary | ICD-10-CM

## 2011-11-11 DIAGNOSIS — F3289 Other specified depressive episodes: Secondary | ICD-10-CM

## 2011-11-11 DIAGNOSIS — F329 Major depressive disorder, single episode, unspecified: Secondary | ICD-10-CM

## 2011-11-11 DIAGNOSIS — E785 Hyperlipidemia, unspecified: Secondary | ICD-10-CM

## 2011-11-11 DIAGNOSIS — L989 Disorder of the skin and subcutaneous tissue, unspecified: Secondary | ICD-10-CM

## 2011-11-11 DIAGNOSIS — M549 Dorsalgia, unspecified: Secondary | ICD-10-CM

## 2011-11-11 DIAGNOSIS — I10 Essential (primary) hypertension: Secondary | ICD-10-CM

## 2011-11-11 DIAGNOSIS — L932 Other local lupus erythematosus: Secondary | ICD-10-CM | POA: Insufficient documentation

## 2011-11-11 LAB — POCT SKIN KOH: Skin KOH, POC: NEGATIVE

## 2011-11-11 MED ORDER — TRAMADOL HCL 50 MG PO TABS: 50.0000 mg | ORAL_TABLET | Freq: Three times a day (TID) | ORAL | Status: DC | PRN

## 2011-11-11 NOTE — Assessment & Plan Note (Signed)
A: Reasonably well-controlled on HCTZ-lisinopril. BP 138/87 today. P: Continue HCTZ-lisinopril. Monitor at regular office visits.

## 2011-11-11 NOTE — Assessment & Plan Note (Signed)
A: Pain comes and goes. Not particularly bothersome, today. Currently decently controlled with Aleve and occasional Tramadol. P: Refilled Tramadol. Will readdress at regular follow-up and may consider sports medicine referral.

## 2011-11-11 NOTE — Assessment & Plan Note (Addendum)
A: Last lipid panel in May 2012. Currently on Zocor 40, well-tolerated. P: Continue Zocor. Will likely check lipid panel and CMP in the next few months.

## 2011-11-11 NOTE — Assessment & Plan Note (Signed)
A: On Paxil. No current symptoms/complaints. P: Continue Paxil and regular follow-up.

## 2011-11-11 NOTE — Patient Instructions (Addendum)
Thank you for coming in, today! It was nice to meet you. We will send your biopsy to our pathology lab and I should get a result back in the next couple of days. When I get your results, I will call you or send you a letter. I will send a refill of your tramadol to your pharmacy. Otherwise, keep taking your medications as needed. Schedule an appointment to come back to see me in a couple of months or as needed to go over your regular medical management. You may wait to schedule that appointment until after we get your results back. You can take ibuprofen or Tylenol for any pain after your biopsy. You may shower as usual and use plain bandaids, if you need. Please call the clinic if you have any questions. --Dr. Casper Harrison

## 2011-11-11 NOTE — Progress Notes (Signed)
Subjective:    Patient ID: Jasmine Gregory, female    DOB: 15-Oct-1950, 61 y.o.   MRN: [id number]  HPI: Pt presents for follow-up with new complaint of darkened spots on her scalp. She reports some mild viral illness-type symptoms for about 1.5 weeks as well, but is unconcerned with them (minor cough with some clear phlegm, sinus congestion/drainage, slightly loose BM today). She thought she might have the flu, but has had no fevers (other than occasional "hot flashes and sometimes a little chill") and denies any body aches or N/V. She is most concerned with the scalp lesions.  Scalp lesions: Pt noticed some darkened spots on her scalp about one week ago when fixing her hair. The areas are different sizes and she has never noticed them before; she states her daughter normally helps fix her hair and thinks that her daughter would have mentioned them, if she had noticed any spots, before. The areas are not raised, red, or painful. They do not itch. She at first thought the lesions might be bruises from where she bumped her head getting into a car about two weeks ago, but they have persisted and aren't painful or changing color/resolving. She does not normally bruise badly, though she does state she stubbed her toe and had a bad bruise about a month ago. She states later in the interview/exam that she was worried the lesions might be "serious," "like cancer or something." She does endorse occasional mild dandruff.  HTN: Reasonably well-controlled. Does not regularly check BP at home. Compliant with lisinopril-HCTZ. No swelling of LE or cough.  HLD: Pt currently compliant with Zocor 40. No myalgias or allergy-type symptoms. Pt is uncertain what last cholesterol was.  Depression: Well-controlled with Paxil. No current symptoms.  Back pain/polyarthropathy: Multifactorial. Arthritis in back, hands, knees. Pain comes and goes, not particularly worse than usual at the moment. Controlled with Aleve, occasional  tramadol; pt requests tramadol refill.  Vit. D deficiency - Currently taking vitamin D regularly. No bone pain, no recent falls.  FH reviewed: non-contributory. Pt is a non-smoker.  Review of Systems: As above. Otherwise specifically denies chest pain, abdominal pain, change in urinary habits, bleeding.     Objective:   Physical Exam BP 138/87  Pulse 100  Ht 5' 4.75" (1.645 m)  Wt 217 lb 14.4 oz (98.839 kg)  BMI 36.54 kg/m2 Gen: elderly female in NAD but slightly anxious-appearing, very pleasant but nervous about scalp lesions Cardio: RRR, no murmur appreciated Lungs: CTAB, no wheezes Abd: soft/nontender, BS+ MSK: mild tenderness in lumbar spine area and paraspinal musculature Skin: scalp with multiple darkened irregularly shaped areas, not raised or tender, without erythema  Areas vary in size, 0.5 cm circular/ovoid, one larger mostly centrally located, ~1 cm x 1.5 cm  Areas have some hair growth/follicles present; no scaling, some generalized flaking skin of scalp  Central area of scalp with thinning hair/baldness (pt states not changed in the past year or so)  Wood's lamp exam negative for fluorescence    Assessment & Plan:  See assessment and plan notes for details. Scalp skin scraping/hair negative for fungus on KOH.  Pt provided verbal consent and written consent for punch biopsy.  Procedure documentation: Punch biopsy of scalp Procedure was attended by Dr. Sw[place]. Pt was provided with and signed informed consent form. Risks and benefits discussed, including bleeding, infection, damage to underlying structures of scalp. Benefits include more definite diagnosis and/or reassurance. Supplies were obtained and organized prior to procedure. A time-out was  performed, identifying patient by name and DOB, as well as verifying location and type of procedure. Procedure: Clean procedure was maintained. Area of scalp was cleaned with alcohol and ~1.5 mL of lidocaine with epinephrine was  injected into the skin to raise a wheal. Non-sterile gloves were removed and sterile gloves worn for remainder of procedure, with maximal clean technique maintained. Analgesia was insured, and then a 4 mm punch biopsy of the lesion was obtained and placed in formalin solution. Pt tolerated procedure well without immediate complication. Number of attempts 2 (first attempt was not deep enough for punched area to be easily elevated for removal with scissors).  Hemostasis was obtained with silver nitrate. Lesion covered with adhesive bandage and gauze/tape to help secure it. Estimated blood loss: <5 mL.

## 2011-11-11 NOTE — Assessment & Plan Note (Signed)
A: Uncertain etiology. Not likely fungal (negative KOH, negative Wood's lamp examination, visual appearance), does not appear cellulitic, not likely basal or squamous carcinoma. P: Punch biopsy performed today. Will follow-up with results and treat as appropriate. May consider dermatology referral.

## 2011-11-11 NOTE — Assessment & Plan Note (Signed)
A: Pt continues to take vitamin D. Last level was 14 in June. P: Continue supplement. Will likely recheck in the next few months.

## 2011-11-15 ENCOUNTER — Telehealth: Payer: Self-pay | Admitting: Family Medicine

## 2011-11-15 NOTE — Telephone Encounter (Signed)
Spoke with patient and she states the area is not red or swollen. When she cleans it she notices when she presses down slightly that pus will drain from it. She describes it a a small amount of pus , but does come out continuously . Will forward message to MD.

## 2011-11-15 NOTE — Telephone Encounter (Addendum)
Pt had punch biopsy last week and now has noticed that is has pus draining.  Not sure what to do.

## 2011-11-15 NOTE — Telephone Encounter (Signed)
Appointment scheduled for tomorrow. Message given from MD.

## 2011-11-15 NOTE — Telephone Encounter (Signed)
Please have pt come back for someone to look at the lesion, if possible. Otherwise she probably would be okay to keep it clean/dry, with some antibiotic ointment, but we may need to make sure it drains well. --CMS

## 2011-11-16 ENCOUNTER — Ambulatory Visit: Payer: Medicare Other | Admitting: Family Medicine

## 2011-11-16 ENCOUNTER — Encounter: Payer: Self-pay | Admitting: Family Medicine

## 2011-11-16 VITALS — BP 144/71 | HR 91 | Temp 98.2°F | Ht 63.0 in | Wt 218.0 lb

## 2011-11-16 DIAGNOSIS — L989 Disorder of the skin and subcutaneous tissue, unspecified: Secondary | ICD-10-CM

## 2011-11-16 NOTE — Progress Notes (Signed)
Subjective:     Patient ID: Jasmine Gregory, female   DOB: 09/27/1950, 61 y.o.   MRN: [id number]  HPI 61 yo female s/p punch bx of scalp lesion 5 days ago here to see if it is infected. She noted " a lot of pus" yesterday - enough to cover the corner of a tissue. No pus today.   She has been using antibiotic oitnment, covering the lesion at night be leaving it open during the day.  No fevers.  No pain.  + nausea 2 days ago.   Review of Systems  See HPI     Objective:   Physical Exam Well appearing.  No distress.  Vitals reviewed.   Punch bx site with scant amount of serosanguinous discharge.  No induration or bogginess appreciated.  No surrounding erythema.     Assessment:     Normal healing from punch bx.    Plan:     Reviewed red flags.  Continue with abx ointment.  Follow up as needed.

## 2011-11-19 ENCOUNTER — Encounter: Payer: Self-pay | Admitting: Family Medicine

## 2011-11-19 ENCOUNTER — Telehealth: Payer: Self-pay | Admitting: Family Medicine

## 2011-11-19 NOTE — Progress Notes (Signed)
Addendum entered in error.     Subjective:    Patient ID: Jasmine Gregory, female    DOB: November 13, 1950, 61 y.o.   MRN: [id number]  HPI    Review of Systems     Objective:   Physical Exam        Assessment & Plan:

## 2011-11-19 NOTE — Telephone Encounter (Signed)
Edit: Pt returned for an office visit with Dr. Swaziland (MD), not an RN visit. --CMS

## 2011-11-19 NOTE — Telephone Encounter (Signed)
Discussed derm path results with pt. Skin biopsy performed 10/24 consistent with chronic discoid lupus erythematosus. Pt had some pus/drainage from biopsy site a few days ago, but came in for an RN visit and the lesion looked okay, and has been using neosporin and keeping the lesion clean. States it occasionally has a spot of blood when she cleans it, but seems to be healing okay. Denies systemic symptoms (fevers, N/V, etc) or local redness/tenderness. Instructed pt to keep the area clean/dry, as well as to photo-protect her skin. She will make an appointment to see me in the next few weeks.  Of note she had a few labs drawn earlier this year by Dr. Clinton Sawyer, presumably to check for RA. Will follow-up these results and treat as appropriate and/or consider referral to rheumatology.

## 2011-12-20 ENCOUNTER — Ambulatory Visit (INDEPENDENT_AMBULATORY_CARE_PROVIDER_SITE_OTHER): Payer: Medicare Other | Admitting: Family Medicine

## 2011-12-20 ENCOUNTER — Encounter: Payer: Self-pay | Admitting: Family Medicine

## 2011-12-20 VITALS — BP 151/86 | HR 114 | Temp 98.1°F | Ht 63.0 in | Wt 217.7 lb

## 2011-12-20 DIAGNOSIS — I10 Essential (primary) hypertension: Secondary | ICD-10-CM

## 2011-12-20 DIAGNOSIS — M130 Polyarthritis, unspecified: Secondary | ICD-10-CM | POA: Insufficient documentation

## 2011-12-20 DIAGNOSIS — Z23 Encounter for immunization: Secondary | ICD-10-CM

## 2011-12-20 DIAGNOSIS — L989 Disorder of the skin and subcutaneous tissue, unspecified: Secondary | ICD-10-CM

## 2011-12-20 NOTE — Progress Notes (Signed)
  Subjective:    Patient ID: Jasmine Gregory, female    DOB: [birth year], 61 y.o.   MRN: [id number]  HPI: Pt presents for follow up of skin biopsy taken at last visit. Biopsy results consistent with chronic cutaneous discoid lupus. Pt states she has noticed that lesions on her scalp are "maybe darker," and it looks like more spots have come up, since last being seen. Her biopsy site is healing/scabbed over, and she has been keeping it clean and dry. She does state that her arthritis has been slightly worse the past few weeks, mostly in her back and knees, but "maybe I've been paying closer attention to it." She does complain of "knots" in the backs of her legs when she stands and is concerned that they might be blood clots.  Otherwise she reports compliance with medications and denies other complaints. Denies fever/chills, SOB, chest pain/abdominal pain, N/V/D. Requests flu shot today.  Review of Systems: As above.     Objective:   Physical Exam BP 151/86  Pulse 114  Temp 98.1 F (36.7 C) (Oral)  Ht 5\' 3"  (1.6 m)  Wt 217 lb 11.2 oz (98.748 kg)  BMI 38.56 kg/m2 Gen: elderly female in NAD Pulm: CTAB, no wheezes Cardio: RRR, normal S1, S2, no murmur Ext: warm, well-perfused; bilateral posterior thighs with some knotting of musculature but no swelling or erythema of legs, negative Homan's sign, no palpable venous cords Skin: scalp with multiple darkened irregularly shaped areas, not raised or tender, without erythema; very similar in appearance to previous exam  Areas vary in size, 0.5-1.5 cm irregular to circular/ovoid,    No scaling of lesions but present some generalized flaking   Area of previous biopsy with scabbing of skin, not tender or erythematous    Assessment & Plan:

## 2011-12-20 NOTE — Assessment & Plan Note (Signed)
A: Skin biopsy consistent with chronic cutaneous lupus. Biopsy site healing well. Lesions appear unchanged, perhaps slightly darker. P: Given concomitant polyarthritis and possibility of RA vs systemic lupus, will defer derm referral and instead refer pt to rheumatology. Will avoid changing medications or topical therapy at this point to prevent clouding clinical picture prior to eval by specialist.

## 2011-12-20 NOTE — Assessment & Plan Note (Addendum)
A: Generalized arthritic-type pain of multiple joints, worst in bilateral knees and low back, also in elbows and hands occasionally. Previous labs from earlier this year are positive for ANA, ESR of 40. RF negative. P: Given skin biopsy results and possibility for systemic condition, will refer to rheumatology.

## 2011-12-20 NOTE — Patient Instructions (Signed)
Thank you for coming in, today. It was nice to see you. I have put in a referral for rheumatology for you. Their office will contact you with information for an appointment. In the meantime, use sun protection (sun screen, hat, long clothing, etc) to reduce the chance of worsening any lesions on your skin due to sun exposure. I do not want to change any of your medications at this point. Depending on what the rheumatologist says, we might adjust some of them. I would like to see you back in about 2 months, to follow up on your blood pressure and other issues. Otherwise, if there is anything you are concerned about as far as your health goes, you can call the clinic or come back to see me whenever you need. --Dr. Casper Harrison

## 2011-12-20 NOTE — Assessment & Plan Note (Signed)
Slightly elevated today. Continue current medications and monitor at follow-up.

## 2012-03-17 ENCOUNTER — Other Ambulatory Visit: Payer: Self-pay | Admitting: *Deleted

## 2012-03-17 MED ORDER — SIMVASTATIN 40 MG PO TABS: 40.0000 mg | ORAL_TABLET | Freq: Every day | ORAL | Status: DC

## 2012-03-20 ENCOUNTER — Other Ambulatory Visit: Payer: Self-pay | Admitting: Family Medicine

## 2012-03-20 MED ORDER — LISINOPRIL-HYDROCHLOROTHIAZIDE 20-25 MG PO TABS: 1.0000 | ORAL_TABLET | Freq: Every day | ORAL | Status: DC

## 2012-03-20 NOTE — Telephone Encounter (Signed)
Refilling lisinopril-HCTZ for pt for one month as rx has run out. Filling in for Dr. Casper Harrison this week. Pt has been on medication long-term with documentation at last visit for HTN of no cough with medication.   Dr Casper Harrison will handle further refills.

## 2012-03-21 ENCOUNTER — Other Ambulatory Visit: Payer: Self-pay | Admitting: *Deleted

## 2012-03-21 MED ORDER — LISINOPRIL-HYDROCHLOROTHIAZIDE 20-25 MG PO TABS: 1.0000 | ORAL_TABLET | Freq: Every day | ORAL | Status: DC

## 2012-03-23 ENCOUNTER — Telehealth: Payer: Self-pay | Admitting: Family Medicine

## 2012-03-23 NOTE — Telephone Encounter (Signed)
Patient wants BP meds sent to [facility] on [address] instead of [facility]. Has been out of meds for 3 days.

## 2012-03-23 NOTE — Telephone Encounter (Signed)
Left message for patient to return call. Please tell her she can call the pharmacy of her choice and have the medication switched. It was already called into [facility].[place], Rodena Medin

## 2012-06-19 ENCOUNTER — Other Ambulatory Visit: Payer: Self-pay | Admitting: Family Medicine

## 2012-09-20 ENCOUNTER — Encounter: Payer: Self-pay | Admitting: Family Medicine

## 2012-09-20 ENCOUNTER — Ambulatory Visit (INDEPENDENT_AMBULATORY_CARE_PROVIDER_SITE_OTHER): Payer: Medicare Other | Admitting: Family Medicine

## 2012-09-20 VITALS — BP 138/80 | HR 99 | Ht 63.0 in | Wt 221.0 lb

## 2012-09-20 DIAGNOSIS — I10 Essential (primary) hypertension: Secondary | ICD-10-CM

## 2012-09-20 DIAGNOSIS — G609 Hereditary and idiopathic neuropathy, unspecified: Secondary | ICD-10-CM

## 2012-09-20 DIAGNOSIS — L930 Discoid lupus erythematosus: Secondary | ICD-10-CM

## 2012-09-20 DIAGNOSIS — F341 Dysthymic disorder: Secondary | ICD-10-CM

## 2012-09-20 DIAGNOSIS — F32A Depression, unspecified: Secondary | ICD-10-CM

## 2012-09-20 DIAGNOSIS — L932 Other local lupus erythematosus: Secondary | ICD-10-CM

## 2012-09-20 DIAGNOSIS — G629 Polyneuropathy, unspecified: Secondary | ICD-10-CM

## 2012-09-20 DIAGNOSIS — Z0000 Encounter for general adult medical examination without abnormal findings: Secondary | ICD-10-CM

## 2012-09-20 DIAGNOSIS — F329 Major depressive disorder, single episode, unspecified: Secondary | ICD-10-CM

## 2012-09-20 DIAGNOSIS — E538 Deficiency of other specified B group vitamins: Secondary | ICD-10-CM

## 2012-09-20 DIAGNOSIS — E785 Hyperlipidemia, unspecified: Secondary | ICD-10-CM

## 2012-09-20 MED ORDER — GABAPENTIN 600 MG PO TABS: 300.0000 mg | ORAL_TABLET | Freq: Two times a day (BID) | ORAL | Status: DC

## 2012-09-20 MED ORDER — LISINOPRIL-HYDROCHLOROTHIAZIDE 20-25 MG PO TABS: 1.0000 | ORAL_TABLET | Freq: Every day | ORAL | Status: DC

## 2012-09-20 MED ORDER — PAROXETINE HCL 40 MG PO TABS: 40.0000 mg | ORAL_TABLET | ORAL | Status: DC

## 2012-09-20 NOTE — Assessment & Plan Note (Signed)
Hx of B12 deficiency, currently taking supplements. Symptoms not currently bothersome, but pt is out of gabapentin. Rx refilled today. F/u PRN.

## 2012-09-20 NOTE — Assessment & Plan Note (Signed)
BP initially 142/80, rechecked manually at 138/80 --> seems relatively well controlled on lisinopril 20-25 daily. No current complaints or intolerance to medication. Rx refilled, today. Follow-up PRN.

## 2012-09-20 NOTE — Assessment & Plan Note (Signed)
A: Noted on skin biopsy in the last year. No current scalp lesions and previous biopsy site is not obviously different from surrounding skin. Currently being seen by dermatology and rheumatology; is uncertain of some medications.  P: Continue meds and f/u with specialists as instructed. Pt told to bring medications to her next visit to confirm/clarify her med list.

## 2012-09-20 NOTE — Progress Notes (Signed)
  Subjective:    Patient ID: Jasmine Gregory, female    DOB: 1950-08-24, 62 y.o.   MRN: [id number]  HPI: Pt comes to clinic today for general follow-up and requests medication refills. Pt also requests to speak about "worrying all the time."  BP - reports compliance with medications, requests refill on lisinopril-HCTZ -no chest pain, headache, palpitations, SOB, leg swelling  HLD - reports compliance with Zocor, needs a refill in about 1 month; no myalgias -pt currently using [facility] for other prescriptions and [facility] for [facility]; [facility] is closing -Zocor is not $4 at [facility] and pt requests new Rx to be sent to [facility]   Peripheral neuropathy - currently not bothering pt very much, but reports she is out of gabapentin  Cutaneous lupus - diagnosed on punch biopsy in the last year; seeing dermatology and rheumatology -reports compliance with Voltaren tablets for inflammation and clobetasol cream; reports compliance with instructions for steroid cream -no new lesions/spots on her scalp in the last few months, to follow up with derm/rheum as needed  Anxiety/depression - previously on Paxil, though has not taken it in about 1 year; tolerated medication well, before; current symptoms slightly vague -uncertain what symptoms she had at diagnosis, but now reports increased anxiety/worry especially when being "inside big places, like church" -pt states she "thought it was claustrophobia," describes feelings of "needing to be outside" and states "fresh air helps" -does not feel frankly depressed but feels like she did not have these vague symptoms when she was taking Paxil  Health maintentance -uncertain of last mammogram (per records, 2011) -uncertain of colonoscopy (has had one in the past, but does not remember when, not in our records)  Pt is a never smoker. In addition to the above documentation, pt's PMH, surgical history, FH, and SH all reviewed and updated where appropriate in the EMR. I  have also reviewed and updated the pt's allergies and current medications as appropriate.  Review of Systems: Per HPI. Otherwise, full 12-system ROS was reviewed and all negative.     Objective:   Physical Exam BP 138/80  Pulse 99  Ht 5\' 3"  (1.6 m)  Wt 221 lb (100.245 kg)  BMI 39.16 kg/m2 Gen: well-appearing elderly female in NAD HEENT: [place]/AT, sclerae/conjunctivae clear, no lid lag, EOMI, PERRLA   MMM, posterior oropharynx clear, no cervical lymphadenopathy  neck supple with full ROM, no masses appreciated; thyroid not enlarged Skin: scalp with thinned hair, but no frank discolorations or lesions as noted on previous visits Cardio: RRR, no murmur appreciated; distal pulses intact/symmetric Pulm: CTAB, no wheezes, normal WOB  Abd: soft, nondistended, BS+, no HSM Ext: warm/well-perfused, no cyanosis/clubbing/edema MSK: strength 5/5 in all four extremities, no frank joint deformity/effusion  normal ROM to all four extremities with no point muscle/bony tenderness in spine Neuro/Psych: alert/oriented, sensation grossly intact; normal gait/balance  mood euthymic, though affect occasionally slightly anxious but not truly distressed     Assessment & Plan:

## 2012-09-20 NOTE — Assessment & Plan Note (Signed)
A: Compliant with Zocor, approx 30 pills remaining. Needs new Rx sent to [facility] when current Rx finishes (previously using [facility] for $4 list; other meds go to [facility], and Zocor is not $4 at [facility]). No issues with medication at this time.  P: Continue Zocor. Will change Rx to [facility] when current supply finishes. Pt to call us for refill.

## 2012-09-20 NOTE — Patient Instructions (Addendum)
Thank you for coming in, today! I will send in prescriptions for your blood pressure and gabapentin. Call me when you need the cholesterol medicine sent to [facility]. I will refill the paroxetine ("Paxil") that you took before for anxiety.  This probably will help with your worrying.  It might make things worse for the first few days after you take it.  Give it about 2-4 weeks to see a big change.  If your symptoms get worse and not better, call me sooner rather than later. Follow up with your dermatologist and rheumatologist as needed for your scalp. Get a mammogram and a colonoscopy. Make an appointment to come back to see me in about 2 months. Please feel free to call with any questions or concerns at any time, at [phone]. --Dr. Casper Harrison

## 2012-09-20 NOTE — Assessment & Plan Note (Signed)
A: Previously on Paxil, uncertain while she stopped this/did not request refill when she ran out. Current symptoms sound mostly like anxiety more than depression; duration and quality of symptoms somewhat vaguely described, though worry in large places/groups of people seem to be the biggest complaint. Uncertain original diagnosis or when she started Paxil, but states she didn't have these symptoms when taking it.  P: Refilled/restarting Paxil. Counseled on worsening symptoms in the short term, need to allow 4-6 weeks for full effect, etc. Strongly recommended f/u specifically for this, and to return sooner if symptoms change or worsen, especially with restarting medicine.

## 2012-09-20 NOTE — Assessment & Plan Note (Signed)
Instructions given to get mammogram (last 2011) and colonoscopy (unsure when this was done, last). Also past due for pap smear and Tdap. Will need to discuss at follow up, along with potentially getting Zostavax.

## 2012-09-20 NOTE — Assessment & Plan Note (Signed)
Pt reports taking supplement. Unsure when last checked (low in 06/2011). No current significant neuropathic symptoms; presumed etiology for neuropathy, at this point. Continue B12 supplement, consider recheck at follow-up.

## 2012-10-26 ENCOUNTER — Telehealth: Payer: Self-pay | Admitting: Family Medicine

## 2012-10-26 MED ORDER — PAROXETINE HCL 20 MG PO TABS: 40.0000 mg | ORAL_TABLET | ORAL | Status: DC

## 2012-10-26 NOTE — Telephone Encounter (Signed)
Will fwd to MD.  Ananda Sitzer L, CMA  

## 2012-10-26 NOTE — Telephone Encounter (Signed)
Pt has been taking 20 mg of generic Paxil. She wwas recently given a 40 mg RX> She gets her prescriptions every 3 months at [facility]. She says the 20 mg are cheaper, She wants to knowif she can go back to 20 mg. Please advise

## 2012-10-26 NOTE — Telephone Encounter (Signed)
That's fine with me. Unsure why 20 mg tablets would be cheaper, though. New Rx placed, 20 mg tablets #60, refills 3. Thanks! --CMS

## 2013-01-29 ENCOUNTER — Other Ambulatory Visit: Payer: Self-pay | Admitting: Family Medicine

## 2013-01-29 MED ORDER — LISINOPRIL-HYDROCHLOROTHIAZIDE 20-25 MG PO TABS: 1.0000 | ORAL_TABLET | Freq: Every day | ORAL | Status: DC

## 2013-05-01 ENCOUNTER — Other Ambulatory Visit: Payer: Self-pay | Admitting: Family Medicine

## 2013-07-03 ENCOUNTER — Encounter: Payer: Self-pay | Admitting: Family Medicine

## 2013-07-03 ENCOUNTER — Ambulatory Visit (INDEPENDENT_AMBULATORY_CARE_PROVIDER_SITE_OTHER): Payer: Medicare Other | Admitting: Family Medicine

## 2013-07-03 VITALS — BP 149/57 | HR 80 | Temp 98.4°F | Ht 63.0 in | Wt 216.5 lb

## 2013-07-03 DIAGNOSIS — I10 Essential (primary) hypertension: Secondary | ICD-10-CM

## 2013-07-03 DIAGNOSIS — M1990 Unspecified osteoarthritis, unspecified site: Secondary | ICD-10-CM

## 2013-07-03 DIAGNOSIS — F419 Anxiety disorder, unspecified: Secondary | ICD-10-CM

## 2013-07-03 DIAGNOSIS — F32A Depression, unspecified: Secondary | ICD-10-CM

## 2013-07-03 DIAGNOSIS — F341 Dysthymic disorder: Secondary | ICD-10-CM

## 2013-07-03 DIAGNOSIS — E785 Hyperlipidemia, unspecified: Secondary | ICD-10-CM

## 2013-07-03 DIAGNOSIS — F329 Major depressive disorder, single episode, unspecified: Secondary | ICD-10-CM

## 2013-07-03 LAB — COMPREHENSIVE METABOLIC PANEL
ALT: 19 U/L (ref 0–35)
AST: 20 U/L (ref 0–37)
Albumin: 4.1 g/dL (ref 3.5–5.2)
Alkaline Phosphatase: 70 U/L (ref 39–117)
BUN: 20 mg/dL (ref 6–23)
CO2: 28 mEq/L (ref 19–32)
Calcium: 9.5 mg/dL (ref 8.4–10.5)
Chloride: 98 mEq/L (ref 96–112)
Creat: 0.86 mg/dL (ref 0.50–1.10)
Glucose, Bld: 121 mg/dL — ABNORMAL HIGH (ref 70–99)
Potassium: 3.8 mEq/L (ref 3.5–5.3)
Sodium: 139 mEq/L (ref 135–145)
Total Bilirubin: 0.3 mg/dL (ref 0.2–1.2)
Total Protein: 7.2 g/dL (ref 6.0–8.3)

## 2013-07-03 LAB — LIPID PANEL
Cholesterol: 214 mg/dL — ABNORMAL HIGH (ref 0–200)
HDL: 43 mg/dL (ref >39)
LDL Cholesterol: 148 mg/dL — ABNORMAL HIGH (ref 0–99)
Total CHOL/HDL Ratio: 5 Ratio
Triglycerides: 115 mg/dL (ref <150)
VLDL: 23 mg/dL (ref 0–40)

## 2013-07-03 MED ORDER — LISINOPRIL-HYDROCHLOROTHIAZIDE 20-25 MG PO TABS: 1.0000 | ORAL_TABLET | Freq: Every day | ORAL | Status: DC

## 2013-07-03 MED ORDER — SIMVASTATIN 40 MG PO TABS: 40.0000 mg | ORAL_TABLET | Freq: Every day | ORAL | Status: DC

## 2013-07-03 MED ORDER — TRAMADOL HCL 50 MG PO TABS: 50.0000 mg | ORAL_TABLET | Freq: Three times a day (TID) | ORAL | Status: DC | PRN

## 2013-07-03 NOTE — Patient Instructions (Signed)
Thank you for coming in, today!  Your blood pressure is slightly high. We will check your kidney and liver function today. I will call you or send you a letter with the results. Look at [email or web address] for information about improving your diet. Better diet consciousness helps with blood pressure control and weight control.  I will give you Dr. Tod Persia card. She is our clinical psychologist. She can discuss different types of therapy for your anxiety.  Come back to see me in about 1-2 months for a yearly physical. We'll see how your blood pressure looks and how your anxiety is doing. We'll talk about the different screening tests we recommend. Please feel free to call with any questions or concerns at any time, at [phone]. --Dr. Venetia Maxon

## 2013-07-03 NOTE — Assessment & Plan Note (Signed)
Wide-spread arthritis in large joints, mostly in knees but also some in back and in shoulders, controlled well with tramadol and Tylenol. Reviewed [email or web address] with pt as a way to help with diet control to improve weight, and reviewed benefits of helping with arthritis. Refilled tramadol. Follow-up PRN.

## 2013-07-03 NOTE — Progress Notes (Signed)
   Subjective:    Patient ID: Jasmine Gregory, female    DOB: [birth year], 63 y.o.   MRN: [id number]  HPI: Pt presents to clinic for follow-up of HTN as well as anxiety and for medication refills.  HTN - pt reports she does not check her blood pressure at home, but she reports compliance with lisinopril-HCTZ - denies chest pain, SOB, headache, vision changes - does cite some increased anxiety which she thinks might be related to her blood pressure (see below)  Anxiety / depression - pt reports compliance with Paxil and feels it helps with her mood, but describes anxiety is "about the same" - describes feelings of worry, nervousness, chest / neck tightness, and upset stomach occasionally - symptoms typically are present only when leaving the house and usually only in "high-stress" environments (doctor office, graduation, etc) - symptoms typically are better once pt gets wherever she is going and are better but not gone within about 30 minutes  Chronic arthritis - widespread, worst in bilateral knees and low back - also present occasionally in shoulder, left > right - not especially worse than normal, controlled at home with tramadol and Tylenol  HLD - reports compliance with Zocor - denies new / different symptoms to suggest side effects - does have "body aches," but she describes more joint stiffness / pain with arthritis than muscle aches, and these pains are not new / different  Pt is a never smoker. In addition to the above documentation, pt's PMH, surgical history, FH, and SH all reviewed and updated where appropriate in the EMR. I have also reviewed and updated the pt's allergies and current medications as appropriate.   Review of Systems: As above. Otherwise feels well. Otherwise, full 12-system ROS was reviewed and all negative.      Objective:   Physical Exam BP 149/57  Pulse 80  Temp(Src) 98.4 F (36.9 C) (Oral)  Ht 5\' 3"  (1.6 m)  Wt 216 lb 8 oz (98.204 kg)  BMI 38.36  kg/m2 Manual recheck BP: 135/70 Gen: well-appearing adult female in NAD HEENT: [place]/AT, sclerae/conjunctivae clear, no lid lag, EOMI, PERRLA   MMM, posterior oropharynx clear, no cervical lymphadenopathy  neck supple with full ROM, no masses appreciated; thyroid not enlarged  Cardio: RRR, no murmur appreciated; distal pulses intact/symmetric Pulm: CTAB, no wheezes, normal WOB  Abd: soft, nondistended, BS+, no HSM Ext: warm/well-perfused, no cyanosis/clubbing/edema MSK: strength 5/5 in all four extremities, no frank joint deformity/effusion  normal ROM to all four extremities, with some increased pain against resistance in knee and shoulder movements  Soft tissue and bony tenderness diffusely in low back / lumbar spine Neuro/Psych: alert/oriented, sensation grossly intact; normal gait/balance  mood reported euthymic with congruent affect  Slightly nervous-appearing at beginning of visit, but appropriate / reactive affect otherwise     Assessment & Plan:  Due for several things for healthcare maintenance. Follow-up in 1-2 months for wellness visit to discuss routine screenings, etc. See problem list notes for problem-specific plans.

## 2013-07-03 NOTE — Assessment & Plan Note (Signed)
A: BP elevated, though possibly with some anxiety component. Reports compliance with lisinopril-HCTZ, but it doesn't look like pt would've had enough pills to last from last fill until now. Regardless, denies symptoms worrisome for hypertensive urgency / emergency.  P: Refilled ACE-HCTZ combo pill, and will check kidney function, today. Plan to f/u in about 1-2 months; discussed therapeutic lifestyle changes and suggested use of [email or web address] to help with diet in order to improve weight control as well as BP. If still elevated at that time, will likely increase ACE or consider adding Norvasc or other agent.

## 2013-07-03 NOTE — Assessment & Plan Note (Signed)
A: Compliant with Zocor. No new symptoms to suggest myopathy.  P: Continue Zocor; refill provided today. CMP and lipid panel drawn today. F/u as needed.

## 2013-07-03 NOTE — Assessment & Plan Note (Signed)
A: Doing well from a mood standpoint with Paxil, but still with apparent component of agoraphobia-like symptoms with anxiety. Symptoms are worse lately but not bothersome day-to-day. Pt has seen counselors in the past and thinks she may have spoken with Dr. Gwenlyn Saran in the past, and is more interested in therapy / counseling than adding medication, for now.  P: Continue Paxil. Considered addition of something like Buspar, but will hold off for now, per pt preference. Suggested speaking with Dr. Gwenlyn Saran and/or counseling/therapy and provided pt with Dr. Tod Persia number. F/u in about 1-2 months or sooner if needed and re-evaluate.

## 2013-07-04 ENCOUNTER — Encounter: Payer: Self-pay | Admitting: Family Medicine

## 2014-01-21 ENCOUNTER — Other Ambulatory Visit: Payer: Self-pay | Admitting: Family Medicine

## 2014-06-20 DIAGNOSIS — M2550 Pain in unspecified joint: Secondary | ICD-10-CM | POA: Diagnosis not present

## 2014-06-20 DIAGNOSIS — M25512 Pain in left shoulder: Secondary | ICD-10-CM | POA: Diagnosis not present

## 2014-06-20 DIAGNOSIS — M150 Primary generalized (osteo)arthritis: Secondary | ICD-10-CM | POA: Diagnosis not present

## 2014-06-20 DIAGNOSIS — L931 Subacute cutaneous lupus erythematosus: Secondary | ICD-10-CM | POA: Diagnosis not present

## 2014-07-12 ENCOUNTER — Encounter: Payer: Self-pay | Admitting: *Deleted

## 2014-08-11 ENCOUNTER — Other Ambulatory Visit: Payer: Self-pay | Admitting: Family Medicine

## 2014-08-16 NOTE — Telephone Encounter (Signed)
Medications refilled. Please have pt make an wellness appt for me.  Thanks, Archie Patten, MD [facility] Resident  08/16/2014, 12:01 PM

## 2014-12-16 ENCOUNTER — Other Ambulatory Visit: Payer: Self-pay | Admitting: Family Medicine

## 2014-12-16 NOTE — Telephone Encounter (Signed)
One refill given.  Patient has not been seen at [facility] in >1 yr.  Patient needs f/u appt before further refills given.  [name], MD, MPH PGY-2,  [facility] 12/16/2014 4:29 PM

## 2014-12-19 ENCOUNTER — Other Ambulatory Visit: Payer: Self-pay | Admitting: Family Medicine

## 2014-12-20 ENCOUNTER — Other Ambulatory Visit: Payer: Self-pay | Admitting: Family Medicine

## 2014-12-20 NOTE — Telephone Encounter (Signed)
Tried to call patient but the number listed for home was incorrect.  Will mail letter for patient to contact office to schedule an appt. Jazmin Hartsell,CMA

## 2014-12-20 NOTE — Telephone Encounter (Signed)
Patient has not been seen in our clinic for almost a year and a half. One refill sent to pharmacy, but no further refills will be given until patient has follow-up appointment. Please call the patient and schedule follow-up appointment within the next month.  [name], MD, MPH PGY-2,  [facility] 12/20/2014 12:24 PM

## 2014-12-23 ENCOUNTER — Encounter: Payer: Self-pay | Admitting: *Deleted

## 2014-12-23 NOTE — Telephone Encounter (Signed)
Letter mailed. Fleeger, [name]

## 2014-12-23 NOTE — Telephone Encounter (Signed)
See previous refill request.  Mailed letter. Jasmine Gregory, [name]

## 2014-12-23 NOTE — Telephone Encounter (Signed)
Patient has not been seen in clinic in >47yr.  1 refill sent, but patient will need f/u appt before further refills given.  Please call patient and have her schedule appt.  [name], MD, MPH PGY-2,  [facility] 12/23/2014 8:34 AM

## 2014-12-26 DIAGNOSIS — L931 Subacute cutaneous lupus erythematosus: Secondary | ICD-10-CM | POA: Diagnosis not present

## 2015-01-08 DIAGNOSIS — M150 Primary generalized (osteo)arthritis: Secondary | ICD-10-CM | POA: Diagnosis not present

## 2015-01-08 DIAGNOSIS — L931 Subacute cutaneous lupus erythematosus: Secondary | ICD-10-CM | POA: Diagnosis not present

## 2015-01-21 ENCOUNTER — Ambulatory Visit (INDEPENDENT_AMBULATORY_CARE_PROVIDER_SITE_OTHER): Payer: Medicare Other | Admitting: Family Medicine

## 2015-01-21 ENCOUNTER — Encounter: Payer: Self-pay | Admitting: Family Medicine

## 2015-01-21 VITALS — BP 155/58 | HR 105 | Temp 98.7°F | Ht 63.0 in | Wt 206.0 lb

## 2015-01-21 DIAGNOSIS — N63 Unspecified lump in breast: Secondary | ICD-10-CM | POA: Diagnosis present

## 2015-01-21 DIAGNOSIS — N6321 Unspecified lump in the left breast, upper outer quadrant: Secondary | ICD-10-CM | POA: Insufficient documentation

## 2015-01-21 DIAGNOSIS — G6289 Other specified polyneuropathies: Secondary | ICD-10-CM | POA: Diagnosis not present

## 2015-01-21 DIAGNOSIS — J309 Allergic rhinitis, unspecified: Secondary | ICD-10-CM

## 2015-01-21 DIAGNOSIS — Z1239 Encounter for other screening for malignant neoplasm of breast: Secondary | ICD-10-CM

## 2015-01-21 MED ORDER — GABAPENTIN 600 MG PO TABS: 300.0000 mg | ORAL_TABLET | Freq: Two times a day (BID) | ORAL | Status: DC

## 2015-01-21 NOTE — Patient Instructions (Addendum)
Nice to meet you today.  Please start taking zyrtec and flonase daily to help with throat irritation and allergy symptoms.  Resume gabapentin for leg pain.  Get your mammogram  Best, Dr. Jacinto Reap  Mammogram A mammogram is an X-ray of the breasts that is done to check for abnormal changes. This procedure can screen for and detect any changes that may suggest breast cancer. A mammogram can also identify other changes and variations in the breast, such as:  Inflammation of the breast tissue (mastitis).  An infected area that contains a collection of pus (abscess).  A fluid-filled sac (cyst).  Fibrocystic changes. This is when breast tissue becomes denser, which can make the tissue feel rope-like or uneven under the skin.  Tumors that are not cancerous (benign). LET [facility] CARE PROVIDER KNOW ABOUT:  Any allergies you have.  If you have breast implants.  If you have had previous breast disease, biopsy, or surgery.  If you are breastfeeding.  Any possibility that you could be pregnant, if this applies.  If you are younger than age 27.  If you have a family history of breast cancer. RISKS AND COMPLICATIONS Generally, this is a safe procedure. However, problems may occur, including:  Exposure to radiation. Radiation levels are very low with this test.  The results being misinterpreted.  The need for further tests.  The inability of the mammogram to detect certain cancers. BEFORE THE PROCEDURE  Schedule your test about 1-2 weeks after your menstrual period. This is usually when your breasts are the least tender.  If you have had a mammogram done at a different facility in the past, get the mammogram X-rays or have them sent to your current exam facility in order to compare them.  Wash your breasts and under your arms the day of the test.  Do not wear deodorants, perfumes, lotions, or powders anywhere on your body on the day of the test.  Remove any jewelry from your  neck.  Wear clothes that you can change into and out of easily. PROCEDURE  You will undress from the waist up and put on a gown.  You will stand in front of the X-ray machine.  Each breast will be placed between two plastic or glass plates. The plates will compress your breast for a few seconds. Try to stay as relaxed as possible during the procedure. This does not cause any harm to your breasts and any discomfort you feel will be very brief.  X-rays will be taken from different angles of each breast. The procedure may vary among health care providers and hospitals. AFTER THE PROCEDURE  The mammogram will be examined by a specialist (radiologist).  You may need to repeat certain parts of the test, depending on the quality of the images. This is commonly done if the radiologist needs a better view of the breast tissue.  Ask when your test results will be ready. Make sure you get your test results.  You may resume your normal activities.   This information is not intended to replace advice given to you by your health care provider. Make sure you discuss any questions you have with your health care provider.   Document Released: 01/02/2000 Document Revised: 09/25/2014 Document Reviewed: 03/15/2014 Elsevier Interactive Patient Education [employer].

## 2015-01-21 NOTE — Assessment & Plan Note (Signed)
Restart gabapentin 300 mg twice a day Follow-up in one month

## 2015-01-21 NOTE — Assessment & Plan Note (Signed)
Diagnostic mammogram to evaluate lump further

## 2015-01-21 NOTE — Progress Notes (Signed)
   Subjective:   Jasmine Gregory is a 65 y.o. female with a history of HTN, HLD, polyarthritis, peripheral neuropathy here for left leg pain, lump in left upper chest, throat pain.  L leg numbness and pain - Patient was previously diagnosed with peripheral neuropathy  - Pain was previously in bilateral feet - She is previously taking gabapentin which helped with the burning sensation - She states that it felt better so she quit taking gabapentin about 2-3 months ago - From chart review appears she was on gabapentin 300 mg twice a day - She denies any worsening of pain with exercise, any DVT risk factors  Lump in left upper chest - Over last several months, patient has noticed that when leaning to the left side, she occasionally gets swelling in her left shoulder but also feels a lump in her left upper chest - She thinks this may be related to her arthritis, for which she is taking Aleve - She also has tramadol when necessary for when the pain is very bad from her arthritis, but she tries not to use this often - She denies any current shoulder swelling, but this swells intermittently -She reports she can always feel the lump in her left upper chest when she is leaning to the left side, but not other times   Throat pain - Feels dry in throat and hurts in AM - Humidifier broke, so she has not been using one - Endorses frequent postnasal drip, rhinorrhea, sneezing  - She no she has a history of allergic rhinitis, but takes Zyrtec infrequently  - She thinks she tried Flonase in the past and that it helped, but she has not taken this in some time  - She wonders if this is a side effect of her lisinopril   Also discussed with patient the importance of regular visits for chronic medical conditions. Patient reports she will schedule a follow-up appointment to discuss her hypertension, among other things, within the next month.  Review of Systems:  Per HPI. All other systems reviewed and are  negative.   PMH, PSH, Medications, Allergies, and FmHx reviewed and updated in EMR.  Social History: never smoker  Objective:  BP 155/58 mmHg  Pulse 105  Temp(Src) 98.7 F (37.1 C) (Oral)  Ht 5\' 3"  (1.6 m)  Wt 206 lb (93.441 kg)  BMI 36.50 kg/m2  Gen:  65 y.o. female in NAD, sitting in bedside chair HEENT: NCAT, MMM, EOMI, PERRL, anicteric sclerae, OP clear, no cervical LAD CV: RRR, no MRG, intact distal pulses Resp: Non-labored, CTAB, no wheezes noted Breasts: right breast normal without mass, No skin or nipple changes or axillary nodes in b/l breasts, abnormal mass palpable in 1 oclock position of L breast, about 2cm firm and mobile and nontender. Ext: WWP, no edema MSK: No obvious deformities, no calf tenderness Neuro: Alert and oriented, speech normal    Assessment & Plan:     Jasmine Gregory is a 65 y.o. female here for L breast lump, allerigc rhinitis, and peripheral neuropathy  Breast lump on left side at 1 o'clock position Diagnostic mammogram to evaluate lump further  Peripheral neuropathy Restart gabapentin 300 mg twice a day Follow-up in one month  Allergic rhinitis Advised Zyrtec daily Also start Flonase daily to help with postnasal drip       [name], MD MPH PGY-2,  [facility] 01/21/2015  12:26 PM

## 2015-01-21 NOTE — Assessment & Plan Note (Signed)
Advised Zyrtec daily Also start Flonase daily to help with postnasal drip

## 2015-01-23 ENCOUNTER — Ambulatory Visit
Admission: RE | Admit: 2015-01-23 | Discharge: 2015-01-23 | Disposition: A | Discharge status: Home / Self Care | Payer: Medicare Other | Source: Ambulatory Visit | Attending: Family Medicine | Admitting: Family Medicine

## 2015-01-23 ENCOUNTER — Other Ambulatory Visit: Payer: Self-pay | Admitting: Family Medicine

## 2015-01-23 DIAGNOSIS — N6321 Unspecified lump in the left breast, upper outer quadrant: Secondary | ICD-10-CM

## 2015-01-23 DIAGNOSIS — N6489 Other specified disorders of breast: Secondary | ICD-10-CM | POA: Diagnosis not present

## 2015-01-23 DIAGNOSIS — N63 Unspecified lump in breast: Secondary | ICD-10-CM | POA: Diagnosis not present

## 2015-01-23 DIAGNOSIS — N6011 Diffuse cystic mastopathy of right breast: Secondary | ICD-10-CM | POA: Diagnosis not present

## 2015-03-06 ENCOUNTER — Telehealth: Payer: Self-pay | Admitting: *Deleted

## 2015-03-06 NOTE — Telephone Encounter (Signed)
Called patient to offer flu vaccine.  Patient stated she needs to schedule f/u appt with PCP Offered to schedule this, however, patient is not at home. Prefers to call back to schedule. States she will get flu shot at f/u. Velora Heckler, RN

## 2015-04-04 ENCOUNTER — Ambulatory Visit (INDEPENDENT_AMBULATORY_CARE_PROVIDER_SITE_OTHER): Payer: Medicare Other | Admitting: Family Medicine

## 2015-04-04 VITALS — BP 124/64 | HR 96 | Temp 98.7°F | Wt 210.6 lb

## 2015-04-04 DIAGNOSIS — K146 Glossodynia: Secondary | ICD-10-CM

## 2015-04-04 DIAGNOSIS — K144 Atrophy of tongue papillae: Secondary | ICD-10-CM | POA: Insufficient documentation

## 2015-04-04 DIAGNOSIS — R232 Flushing: Secondary | ICD-10-CM

## 2015-04-04 DIAGNOSIS — E785 Hyperlipidemia, unspecified: Secondary | ICD-10-CM

## 2015-04-04 DIAGNOSIS — N951 Menopausal and female climacteric states: Secondary | ICD-10-CM

## 2015-04-04 DIAGNOSIS — E559 Vitamin D deficiency, unspecified: Secondary | ICD-10-CM | POA: Diagnosis not present

## 2015-04-04 DIAGNOSIS — E669 Obesity, unspecified: Secondary | ICD-10-CM

## 2015-04-04 DIAGNOSIS — R7309 Other abnormal glucose: Secondary | ICD-10-CM

## 2015-04-04 DIAGNOSIS — Z0000 Encounter for general adult medical examination without abnormal findings: Secondary | ICD-10-CM

## 2015-04-04 DIAGNOSIS — E538 Deficiency of other specified B group vitamins: Secondary | ICD-10-CM

## 2015-04-04 LAB — LIPID PANEL
Cholesterol: 168 mg/dL (ref 125–200)
HDL: 49 mg/dL (ref >=46)
LDL Cholesterol: 100 mg/dL (ref <130)
Total CHOL/HDL Ratio: 3.4 Ratio (ref <=5.0)
Triglycerides: 95 mg/dL (ref <150)
VLDL: 19 mg/dL (ref <30)

## 2015-04-04 LAB — CBC
HCT: 28.5 % — ABNORMAL LOW (ref 36.0–46.0)
Hemoglobin: 9.4 g/dL — ABNORMAL LOW (ref 12.0–15.0)
MCH: 36.3 pg — ABNORMAL HIGH (ref 26.0–34.0)
MCHC: 33 g/dL (ref 30.0–36.0)
MCV: 110 fL — ABNORMAL HIGH (ref 78.0–100.0)
MPV: 10 fL (ref 8.6–12.4)
Platelets: 348 10*3/uL (ref 150–400)
RBC: 2.59 MIL/uL — ABNORMAL LOW (ref 3.87–5.11)
RDW: 15.3 % (ref 11.5–15.5)
WBC: 4.8 10*3/uL (ref 4.0–10.5)

## 2015-04-04 LAB — COMPLETE METABOLIC PANEL WITH GFR
ALT: 14 U/L (ref 6–29)
AST: 17 U/L (ref 10–35)
Albumin: 4.2 g/dL (ref 3.6–5.1)
Alkaline Phosphatase: 61 U/L (ref 33–130)
BUN: 17 mg/dL (ref 7–25)
CO2: 30 mmol/L (ref 20–31)
Calcium: 9.7 mg/dL (ref 8.6–10.4)
Chloride: 98 mmol/L (ref 98–110)
Creat: 0.8 mg/dL (ref 0.50–0.99)
GFR, Est African American: 89 mL/min (ref >=60)
GFR, Est Non African American: 77 mL/min (ref >=60)
Glucose, Bld: 136 mg/dL — ABNORMAL HIGH (ref 65–99)
Potassium: 4.1 mmol/L (ref 3.5–5.3)
Sodium: 136 mmol/L (ref 135–146)
Total Bilirubin: 0.5 mg/dL (ref 0.2–1.2)
Total Protein: 7.5 g/dL (ref 6.1–8.1)

## 2015-04-04 LAB — TSH: TSH: 1.2 mIU/L

## 2015-04-04 LAB — POCT GLYCOSYLATED HEMOGLOBIN (HGB A1C): Hemoglobin A1C: 5.6

## 2015-04-04 LAB — VITAMIN B12: Vitamin B-12: 149 pg/mL — ABNORMAL LOW (ref 200–1100)

## 2015-04-04 MED ORDER — CLONIDINE HCL 0.1 MG PO TABS: 0.1000 mg | ORAL_TABLET | Freq: Two times a day (BID) | ORAL | Status: DC

## 2015-04-04 MED ORDER — ZOSTER VACCINE LIVE 19400 UNT/0.65ML ~~LOC~~ SOLR: 0.6500 mL | Freq: Once | SUBCUTANEOUS | Status: DC

## 2015-04-04 NOTE — Assessment & Plan Note (Signed)
Advised on diet and exercise. 

## 2015-04-04 NOTE — Assessment & Plan Note (Signed)
Recheck vitamin D today

## 2015-04-04 NOTE — Assessment & Plan Note (Signed)
No oral ulcers or concerning findings on exam Could be related to B-12 deficiency Watchful waiting and follow-up as needed

## 2015-04-04 NOTE — Assessment & Plan Note (Addendum)
Check lipids today Continue Zocor

## 2015-04-04 NOTE — Assessment & Plan Note (Signed)
Could be related to lack of estrogen, but patient should've undergone ovarian failure many years ago No concerning B symptoms like weight loss Advised patient on cancer screenings Check TSH, CBC, cmet today Start clonidine 0.1 mg twice a day Follow-up in one month

## 2015-04-04 NOTE — Assessment & Plan Note (Addendum)
Up-to-date on mammogram No cervix present on bimanual exam, so patient does not need Pap smear as hysterectomy was for fibroids Patient declines flu shot Advised patient on colonoscopy, DEXA scan, Tdap, Pneumovax, Zostavax Patient to obtain records from rheumatology to see whether she has had some of these completed in the past

## 2015-04-04 NOTE — Progress Notes (Signed)
Subjective:   Jasmine Gregory is a 65 y.o. female with a history of HTN, HLD, obesity, vit D deficiency here for for well woman/preventative visit and annual GYN examination  Acute Concerns:   Tongue burning with drinking soda - Intermittently - hasnt noticed any sores in her mouth - food irritates mouth sometimes - not spicy foods, bland foods can make - never happened before - started ~1 month ago  Hot flashes - ongoing for 1.5 month at worse than previously (initially since ~ age [age]) - come during the day and at night - had hysterectomy >10 years ago - denies weight loss or LAD  Diet: doesn't think she eats much: toaster strudel for breakfast, chicken or sandwich with chips for lunch, dinner with veggies and baked chicken and bread, will snack on chips during the day, drinks 1 pepsi per day, drinks a lot of juice  Exercise: no formal exercise, just walks around the store during the day  Sexual/Birth History: - not sexually active - G2P2002  Birth Control: s/p hysterectomy  POA/Living Will: does not have   Review of Systems: Per HPI.   PMH, PSH, Medications, Allergies, and FmHx reviewed and updated in EMR.  Social History: never smoker  Immunization:  Tdap/TD: 2003  Influenza: declined  Pneumococcal: needs - thinks she has had them though with rheumatology  Herpes Zoster: needs - will give Rx  Cancer Screening:  Pap Smear: s/p hysterectomy - unsure if she has a cervix still   Mammogram: 01/2015 - benign  Colonoscopy: needs - reports previous difficulty with drinking prep  Dexa: needs - thinks she has had this with rheumatology   Objective:  BP 124/64 mmHg  Pulse 96  Temp(Src) 98.7 F (37.1 C) (Oral)  Wt 210 lb 9.6 oz (95.528 kg)  Gen:  65 y.o. female in NAD HEENT: NCAT, MMM, EOMI, PERRL, anicteric sclerae, TMs clear b/l, OP clear CV: RRR, no MRG Resp: Non-labored, CTAB, no wheezes noted Abd: Soft, NTND, BS present, no guarding or organomegaly Ext:  WWP, no edema Gyn: External genitalia within normal limits. Bimanual exam revealed no cervix or uterus.  No adnexal masses bilaterally.   MSK: Full ROM, strength intact Neuro: Alert and oriented, speech normal       Chemistry      Component Value Date/Time   NA 139 07/03/2013 0932   K 3.8 07/03/2013 0932   CL 98 07/03/2013 0932   CO2 28 07/03/2013 0932   BUN 20 07/03/2013 0932   CREATININE 0.86 07/03/2013 0932   CREATININE 0.89 04/17/2009 1915      Component Value Date/Time   CALCIUM 9.5 07/03/2013 0932   ALKPHOS 70 07/03/2013 0932   AST 20 07/03/2013 0932   ALT 19 07/03/2013 0932   BILITOT 0.3 07/03/2013 0932      Lab Results  Component Value Date   WBC 6.3 06/17/2010   HGB 10.6* 06/17/2010   HCT 34.0* 06/17/2010   MCV 86.1 06/17/2010   PLT 352 06/17/2010   Lab Results  Component Value Date   TSH 1.041 05/31/2006   Lab Results  Component Value Date   HGBA1C 5.6 04/04/2015    Assessment & Plan:     Jasmine Gregory is a 65 y.o. female here for annual well woman/preventative exam and GYN exam.  Vitamin B 12 deficiency Last checked in 2014 Could be related to tongue pain Recheck today  Tongue pain No oral ulcers or concerning findings on exam Could be related to B-12  deficiency Watchful waiting and follow-up as needed  HLD (hyperlipidemia) Check lipids today Continue Zocor  OBESITY Advised on diet and exercise  Hypovitaminosis D Recheck vitamin D today  Healthcare maintenance Up-to-date on mammogram No cervix present on bimanual exam, so patient does not need Pap smear as hysterectomy was for fibroids Patient declines flu shot Advised patient on colonoscopy, DEXA scan, Tdap, Pneumovax, Zostavax Patient to obtain records from rheumatology to see whether she has had some of these completed in the past  Hot flashes Could be related to lack of estrogen, but patient should've undergone ovarian failure many years ago No concerning B symptoms like  weight loss Advised patient on cancer screenings Check TSH, CBC, cmet today Start clonidine 0.1 mg twice a day Follow-up in one month     Jasmine Crews, MD MPH PGY-2,  [place] 04/04/2015  10:42 AM

## 2015-04-04 NOTE — Patient Instructions (Signed)
Nice to see you again today. We are getting some labs today and someone will call you or send you a letter with the results when they're available. Please start taking clonidine twice daily as prescribed for the hot flashes. Come back to see me in one month to follow-up on the hot flashes.  Take care, Dr. Jacinto Reap

## 2015-04-04 NOTE — Assessment & Plan Note (Signed)
Last checked in 2014 Could be related to tongue pain Recheck today

## 2015-04-05 LAB — VITAMIN D 25 HYDROXY (VIT D DEFICIENCY, FRACTURES): Vit D, 25-Hydroxy: 16 ng/mL — ABNORMAL LOW (ref 30–100)

## 2015-04-10 ENCOUNTER — Other Ambulatory Visit: Payer: Self-pay | Admitting: Family Medicine

## 2015-04-17 ENCOUNTER — Telehealth: Payer: Self-pay | Admitting: Family Medicine

## 2015-04-17 DIAGNOSIS — R232 Flushing: Secondary | ICD-10-CM

## 2015-04-17 MED ORDER — ERGOCALCIFEROL 1.25 MG (50000 UT) PO CAPS: 50000.0000 [IU] | ORAL_CAPSULE | ORAL | Status: DC

## 2015-04-17 MED ORDER — CLONIDINE HCL 0.1 MG PO TABS: 0.1000 mg | ORAL_TABLET | Freq: Two times a day (BID) | ORAL | Status: DC

## 2015-04-17 NOTE — Telephone Encounter (Signed)
Call patient discussed lab results.  Cholesterol wnl (ASCVD 6.9%), A1c wnl, CMET wnl, TSH wnl.  Vit D low at 16.  Start weekly supplement x12 wks then recheck.  B12 low - patient has not been taking vit B12.  She will resume.  This could be related to tongue pain.  Hemoglobin low at 9.4, but this is patient's baseline.  No active bleeding.  Iron panel at next visit in 1 month to f/u hot flashes.  [name], MD, MPH PGY-2,  [facility] 04/17/2015 3:16 PM

## 2015-05-21 ENCOUNTER — Other Ambulatory Visit: Payer: Self-pay | Admitting: Obstetrics and Gynecology

## 2015-06-26 DIAGNOSIS — M25512 Pain in left shoulder: Secondary | ICD-10-CM | POA: Diagnosis not present

## 2015-06-26 DIAGNOSIS — L931 Subacute cutaneous lupus erythematosus: Secondary | ICD-10-CM | POA: Diagnosis not present

## 2015-06-26 DIAGNOSIS — M2550 Pain in unspecified joint: Secondary | ICD-10-CM | POA: Diagnosis not present

## 2015-06-26 DIAGNOSIS — M150 Primary generalized (osteo)arthritis: Secondary | ICD-10-CM | POA: Diagnosis not present

## 2015-10-12 NOTE — Progress Notes (Signed)
   [facility] Phone: [phone]   Date of Visit: 10/13/2015   HPI:  Jasmine Gregory is a 64 y.o. female presenting to clinic today for same day appointment. PCP: Lavon Paganini, MD Concerns today include: "rash in private area"  Rash:  - noticed 1 week ago that she had a red spot and then it went to two spots in her inguinal region; also reports of rash in her gluteal cleft  - since Friday has gotten more painful and pruritic. Reports that it is also a little swollen in that region  - no dysuria - no history of similar rash - not sexually active - no history of STI - reports of new detergent Oxy-clean- for about a month or two (washes clothes every other week)  - no new fragrances or lotions  - no fevers or chills - no vaginal discharge, no vaginal bleeding  ROS: See HPI.   PHYSICAL EXAM: BP 133/60   Pulse 72   Temp 98.2 F (36.8 C) (Oral)   Wt 204 lb (92.5 kg)   SpO2 92%   BMI 36.14 kg/m  Gen: NAD, pleasant GU: mild erythema of the labial minora and the structures more medial to labia minora, that is tender to touch, small spot at the 7 o'clock position that seems more like an abrasion. No overt vesicles noted. Anatomy somewhat difficult to note due to some swelling of the area.  Buttock: gluteal cleft- large area of erythema with clear cut borders that are slightly raised, area is weeping consistent with candida intertrigo .  ASSESSMENT/PLAN:  Vulval Candidiasis: Significant with swelling of the area and also TTP.  - Fluconazole x 1, repeat dose 3 days after initial dose - discussed possible side effects of taking fluconazole while on Zocor. Patient will hold Zocor while she is on fluconazole.  - Nystatin powder TID for 2-3 weeks  Candidal Intertrigo:  - Nystatin powder TID x 2-3 weeks.  - return precautions discussed.    Smiley Houseman, MD PGY [place]

## 2015-10-13 ENCOUNTER — Ambulatory Visit (INDEPENDENT_AMBULATORY_CARE_PROVIDER_SITE_OTHER): Payer: Medicare Other | Admitting: Internal Medicine

## 2015-10-13 ENCOUNTER — Encounter: Payer: Self-pay | Admitting: Internal Medicine

## 2015-10-13 VITALS — BP 133/60 | HR 72 | Temp 98.2°F | Wt 204.0 lb

## 2015-10-13 DIAGNOSIS — B372 Candidiasis of skin and nail: Secondary | ICD-10-CM

## 2015-10-13 DIAGNOSIS — B373 Candidiasis of vulva and vagina: Secondary | ICD-10-CM | POA: Diagnosis not present

## 2015-10-13 DIAGNOSIS — B3731 Acute candidiasis of vulva and vagina: Secondary | ICD-10-CM

## 2015-10-13 MED ORDER — FLUCONAZOLE 150 MG PO TABS: 150.0000 mg | ORAL_TABLET | Freq: Once | ORAL | 0 refills | Status: AC

## 2015-10-13 MED ORDER — NYSTATIN 100000 UNIT/GM EX POWD: Freq: Three times a day (TID) | CUTANEOUS | 0 refills | Status: AC

## 2015-10-13 NOTE — Patient Instructions (Addendum)
Please take Diflucan 1 tablet once then repeat (1 tablet) 3 days from first dose.  Please use Nystatin Powder three times daily for 2-3 weeks Please follow up in 2-3 weeks if symptoms do not resolve; follow up sooner if symptoms worsen despite treatment.  Cutaneous Candidiasis Cutaneous candidiasis is a condition in which there is an overgrowth of yeast (candida) on the skin. Yeast normally live on the skin, but in small enough numbers not to cause any symptoms. In certain cases, increased growth of the yeast may cause an actual yeast infection. This kind of infection usually occurs in areas of the skin that are constantly warm and moist, such as the armpits or the groin. Yeast is the most common cause of diaper rash in babies and in people who cannot control their bowel movements (incontinence). CAUSES  The fungus that most often causes cutaneous candidiasis is Candida albicans. Conditions that can increase the risk of getting a yeast infection of the skin include:  Obesity.  Pregnancy.  Diabetes.  Taking antibiotic medicine.  Taking birth control pills.  Taking steroid medicines.  Thyroid disease.  An iron or zinc deficiency.  Problems with the immune system. SYMPTOMS   Red, swollen area of the skin.  Bumps on the skin.  Itchiness. DIAGNOSIS  The diagnosis of cutaneous candidiasis is usually based on its appearance. Light scrapings of the skin may also be taken and viewed under a microscope to identify the presence of yeast. TREATMENT  Antifungal creams may be applied to the infected skin. In severe cases, oral medicines may be needed.  HOME CARE INSTRUCTIONS   Keep your skin clean and dry.  Maintain a healthy weight.  If you have diabetes, keep your blood sugar under control. SEEK IMMEDIATE MEDICAL CARE IF:  Your rash continues to spread despite treatment.  You have a fever, chills, or abdominal pain.   This information is not intended to replace advice given to  you by your health care provider. Make sure you discuss any questions you have with your health care provider.   Document Released: 09/22/2010 Document Revised: 03/29/2011 Document Reviewed: 07/08/2014 Elsevier Interactive Patient Education [employer].

## 2015-11-10 ENCOUNTER — Other Ambulatory Visit: Payer: Self-pay | Admitting: Family Medicine

## 2015-11-13 ENCOUNTER — Ambulatory Visit (INDEPENDENT_AMBULATORY_CARE_PROVIDER_SITE_OTHER): Payer: Medicare Other | Admitting: Family Medicine

## 2015-11-13 ENCOUNTER — Encounter: Payer: Self-pay | Admitting: Family Medicine

## 2015-11-13 VITALS — BP 147/53 | HR 82 | Temp 98.0°F | Ht 63.0 in | Wt 206.2 lb

## 2015-11-13 DIAGNOSIS — Z23 Encounter for immunization: Secondary | ICD-10-CM

## 2015-11-13 DIAGNOSIS — R232 Flushing: Secondary | ICD-10-CM

## 2015-11-13 DIAGNOSIS — B3749 Other urogenital candidiasis: Secondary | ICD-10-CM

## 2015-11-13 DIAGNOSIS — S30811A Abrasion of abdominal wall, initial encounter: Secondary | ICD-10-CM

## 2015-11-13 MED ORDER — PAROXETINE HCL 20 MG PO TABS: 40.0000 mg | ORAL_TABLET | Freq: Every day | ORAL | 3 refills | Status: DC

## 2015-11-13 MED ORDER — NYSTATIN 100000 UNIT/GM EX CREA: 1.0000 "application " | TOPICAL_CREAM | Freq: Two times a day (BID) | CUTANEOUS | 1 refills | Status: DC

## 2015-11-13 NOTE — Patient Instructions (Addendum)
Nice to see you again today.  Apply the nystatin cream to the area that is itching twice daily until it is cleared up. You can use Gold Bond or baby powder to keep area dry during the day.  Schedule a Medicare annual wellness visit at your convenience.  You should also come back to see me in a few weeks for your blood pressure and vitamin D.  You can take over-the-counter vitamin D once daily in the meantime if you want.  Take care, Dr. B  Cutaneous Candidiasis Cutaneous candidiasis is a condition in which there is an overgrowth of yeast (candida) on the skin. Yeast normally live on the skin, but in small enough numbers not to cause any symptoms. In certain cases, increased growth of the yeast may cause an actual yeast infection. This kind of infection usually occurs in areas of the skin that are constantly warm and moist, such as the armpits or the groin. Yeast is the most common cause of diaper rash in babies and in people who cannot control their bowel movements (incontinence). CAUSES  The fungus that most often causes cutaneous candidiasis is Candida albicans. Conditions that can increase the risk of getting a yeast infection of the skin include:  Obesity.  Pregnancy.  Diabetes.  Taking antibiotic medicine.  Taking birth control pills.  Taking steroid medicines.  Thyroid disease.  An iron or zinc deficiency.  Problems with the immune system. SYMPTOMS   Red, swollen area of the skin.  Bumps on the skin.  Itchiness. DIAGNOSIS  The diagnosis of cutaneous candidiasis is usually based on its appearance. Light scrapings of the skin may also be taken and viewed under a microscope to identify the presence of yeast. TREATMENT  Antifungal creams may be applied to the infected skin. In severe cases, oral medicines may be needed.  HOME CARE INSTRUCTIONS   Keep your skin clean and dry.  Maintain a healthy weight.  If you have diabetes, keep your blood sugar under control. SEEK  IMMEDIATE MEDICAL CARE IF:  Your rash continues to spread despite treatment.  You have a fever, chills, or abdominal pain.   This information is not intended to replace advice given to you by your health care provider. Make sure you discuss any questions you have with your health care provider.   Document Released: 09/22/2010 Document Revised: 03/29/2011 Document Reviewed: 07/08/2014 Elsevier Interactive Patient Education [employer].

## 2015-11-13 NOTE — Addendum Note (Signed)
Addended by: Katharina Caper, Crickett Abbett D on: 11/13/2015 09:47 AM   Modules accepted: Orders, SmartSet

## 2015-11-13 NOTE — Assessment & Plan Note (Signed)
Discussed possibility of hormone replacement therapy, but hesitant given patient's age No concerning B symptoms like weight loss TSH recently within normal limits Can consider increasing clonidine at next visit

## 2015-11-13 NOTE — Assessment & Plan Note (Signed)
Appears to be improving from previous visits, but some cutaneous symptoms remain Treat with nystatin cream twice a day until resolved Return precautions discussed Discussed keeping area clean and dry

## 2015-11-13 NOTE — Progress Notes (Signed)
   Subjective:   ABRINA GOOTEE is a 65 y.o. female with a history of HTN, GERD, peripheral neuropathy, HLD, cutaneous lupus here for follow-up of vulvovaginal and intertriginous candidal infections  Yeast infection - patient has been using nystatin powder TID since 9/25 visit - also s/p fluconazole x2 - patient reports was doing better, but seemed to flare back up - now just itching at top of vulva - doesn't appear red currently  Hot flashes - ongoing for years - many times per day - never taken HRT - s/p hysterectomy - Occurs at all times of the day not just at night - No weight loss  Also complains of abrasion to upper abdomen recently without drainage or redness or fevers  Review of Systems:  Per HPI.   Social History: never smoker  Objective:  BP (!) 147/53   Pulse 82   Temp 98 F (36.7 C) (Oral)   Ht 5\' 3"  (1.6 m)   Wt 206 lb 3.2 oz (93.5 kg)   BMI 36.53 kg/m   Gen:  65 y.o. female in NAD HEENT: NCAT, MMM, EOMI, PERRL, anicteric sclerae CV: RRR, no MRG Resp: Non-labored, CTAB, no wheezes noted Abd: Soft, NTND, BS present, no guarding or organomegaly GYN:  External genitalia without redness or swelling. No rash noted.  TTP and itchy on mons and superior labia majora.   Ext: WWP, no edema MSK: No obvious deformities, gait intact, small abrasion to upper abdomen without signs of infection Neuro: Alert and oriented, speech normal      Chemistry      Component Value Date/Time   NA 136 04/04/2015 0927   K 4.1 04/04/2015 0927   CL 98 04/04/2015 0927   CO2 30 04/04/2015 0927   BUN 17 04/04/2015 0927   CREATININE 0.80 04/04/2015 0927      Component Value Date/Time   CALCIUM 9.7 04/04/2015 0927   ALKPHOS 61 04/04/2015 0927   AST 17 04/04/2015 0927   ALT 14 04/04/2015 0927   BILITOT 0.5 04/04/2015 0927      Lab Results  Component Value Date   WBC 4.8 04/04/2015   HGB 9.4 (L) 04/04/2015   HCT 28.5 (L) 04/04/2015   MCV 110.0 (H) 04/04/2015   PLT 348  04/04/2015   Lab Results  Component Value Date   TSH 1.20 04/04/2015   Lab Results  Component Value Date   HGBA1C 5.6 04/04/2015   Assessment & Plan:     DREA GOLIK is a 65 y.o. female here for   Candida infection of genital region Appears to be improving from previous visits, but some cutaneous symptoms remain Treat with nystatin cream twice a day until resolved Return precautions discussed Discussed keeping area clean and dry  Hot flashes Discussed possibility of hormone replacement therapy, but hesitant given patient's age No concerning B symptoms like weight loss TSH recently within normal limits Can consider increasing clonidine at next visit     TDAP given for abrasion   [name], MD MPH PGY-3,  [facility] 11/13/2015  9:35 AM

## 2015-11-27 ENCOUNTER — Encounter: Payer: Self-pay | Admitting: Family Medicine

## 2015-11-27 ENCOUNTER — Ambulatory Visit (INDEPENDENT_AMBULATORY_CARE_PROVIDER_SITE_OTHER): Payer: Medicare Other | Admitting: Family Medicine

## 2015-11-27 VITALS — BP 177/66 | HR 98 | Temp 98.1°F | Ht 63.0 in | Wt 201.0 lb

## 2015-11-27 DIAGNOSIS — E538 Deficiency of other specified B group vitamins: Secondary | ICD-10-CM

## 2015-11-27 DIAGNOSIS — K144 Atrophy of tongue papillae: Secondary | ICD-10-CM | POA: Diagnosis not present

## 2015-11-27 DIAGNOSIS — D519 Vitamin B12 deficiency anemia, unspecified: Secondary | ICD-10-CM | POA: Diagnosis not present

## 2015-11-27 DIAGNOSIS — I10 Essential (primary) hypertension: Secondary | ICD-10-CM

## 2015-11-27 DIAGNOSIS — E559 Vitamin D deficiency, unspecified: Secondary | ICD-10-CM | POA: Diagnosis not present

## 2015-11-27 LAB — CBC
HCT: 17.2 % — ABNORMAL LOW (ref 35.0–45.0)
Hemoglobin: 5.8 g/dL — CL (ref 11.7–15.5)
MCH: 36.5 pg — ABNORMAL HIGH (ref 27.0–33.0)
MCHC: 33.7 g/dL (ref 32.0–36.0)
MCV: 108.2 fL — ABNORMAL HIGH (ref 80.0–100.0)
MPV: 9.5 fL (ref 7.5–12.5)
Platelets: 154 10*3/uL (ref 140–400)
RBC: 1.59 MIL/uL — ABNORMAL LOW (ref 3.80–5.10)
RDW: 20.9 % — ABNORMAL HIGH (ref 11.0–15.0)
WBC: 2.9 10*3/uL — ABNORMAL LOW (ref 3.8–10.8)

## 2015-11-27 LAB — FERRITIN: Ferritin: 256 ng/mL (ref 20–288)

## 2015-11-27 LAB — FOLATE: Folate: 23.3 ng/mL (ref >5.4)

## 2015-11-27 LAB — VITAMIN B12: Vitamin B-12: 146 pg/mL — ABNORMAL LOW (ref 200–1100)

## 2015-11-27 MED ORDER — LISINOPRIL-HYDROCHLOROTHIAZIDE 20-12.5 MG PO TABS: 2.0000 | ORAL_TABLET | Freq: Every day | ORAL | 3 refills | Status: DC

## 2015-11-27 MED ORDER — CYANOCOBALAMIN 1000 MCG/ML IJ SOLN
1000.0000 ug | Freq: Once | INTRAMUSCULAR | Status: AC
  Administered 2015-11-27: 1000 ug via INTRAMUSCULAR

## 2015-11-27 NOTE — Progress Notes (Signed)
65 y.o female presents with 2 week history of burning tongue, ageusia and generalized fatigue.   HPI: Reports a 2 week history of burning tongue and inability to taste her food. She also states that she has been feeling more fatigued than usual over the last two weeks. Denies headache or recent trauma, facial or extremity weakness. Denies chest pain, dyspnea, fever, chills, nausea, vomiting. Denies nasal discharge or ear pain.   PMH: HTN HLD Anemia Anxiety/Depression Allergic rhinitis GERD DJD Cutaneous Lupus (scalp)  MEDS: Zyrtec 10 mg Clonidine Vitamin D2 Ferrous sulfate Flonase Gabapentin Lisinopril-HCTZ 20-25mg  Paroxetine 20 mg Simvastatin 40 mg Tramadol 50 mg  Vitamin B12  ALLERGIES: NKDA  SoHx: Tob: Denies EtOH: Denies Illicit: Denies  ROS: Gen: Positive for Generalized fatigue. Denies weight loss.  Neuro: Negative for dizziness, syncope, weakness or headache, changes to vision  HEENT: Cv: Negative for chest pain, dyspnea, orthopnea, palpitations. Resp: Negative for cough, wheezing, bloody sputum Gi: Negative for bloody stool, abdominal pain Gu: Negative for hematuria, dysuria, hesitancy Skin: Negative for rash, erythema, or edema.   PE: GEN: Pleasant female. NAD.  Neuro: Alert and interacting appropriately. CN II-XII intact. HEENT: Normocephalic. No extraoral swellings present. PERRLA. EOMI. Bilateral TM grey with no erythema or effusions. Nasal dorsum midline with no nasal erythema or edema. Complete upper and lower denture in place. Posterior pharynx clear with no erythema or edema. Uvula midline. Dorsum of tongue shiny and erythematous with effacement of filliform papillae.  Upper and lower lip mucosa with slight pallor. No LAD. No Thyromegaly. Trachea midline.  Cv: Regular rate and rhythm. No murmurs. S1 and S2 present.  Pulm: Lungs CTAB. Normal work of breathing.  Abd: Soft and non-tender. No rebound or guarding.  Ext: Warm and well perfused. 2+ DP and  radial pulses. Strength 5/5 in bilateral upper and lower extremities.   Assessment:  Atrophic glossitis and fatigue likely due anemia.   Plan: Order: -Iron panel -Folate level  -Vitamin B12 level -CBC -CMP -Vitamin D levels -Ferritin levels

## 2015-11-27 NOTE — Patient Instructions (Signed)
Nice to see you again today. We are getting some labs today and I will call you next week to discuss changing your vitamin supplementation levels. I think your vitamin B-12 deficiency is related to her mouth pain, your lack of taste, and her fatigue.  If constipation is an issue for you, you can try Colace twice daily to soften your stool as well as Miralax.  Titrate the Miralax to one soft bowel movement per day. Start with 1 cap full in one glass of water daily.  Your blood pressure is elevated today. I will increase her lisinopril dose to 40 mg daily and leave your HCTZ dose at 25 mg daily. I would like to see you back in about one month.  Take care, Dr. B  Vitamin B12 Deficiency Not having enough vitamin B12 is called a deficiency. Vitamin B12 is an important vitamin. Your body needs vitamin B12 to:   Make red blood cells.  Make DNA. This is the genetic material inside all of your cells.  Help your nerves work properly so they can carry messages from your brain to your body. CAUSES  Not eating enough foods that contain vitamin B12.  Not having enough stomach acid and digestive juices. The body needs these to absorb vitamin B12 from the food you eat.  Having certain digestive system diseases that make it hard to absorb vitamin B12. These diseases include Crohn's disease, chronic pancreatitis, and cystic fibrosis.  Having pernicious anemia, which is a condition where the body has too few red blood cells. People with this condition do not make enough of a protein called "intrinsic factor," which is needed to absorb vitamin B12.  Having a surgery in which part of the stomach or small intestine is removed.  Taking certain medicines that make it hard for the body to absorb vitamin B12. These medicines include:  Heartburn medicine (antacids and proton pump inhibitors).  A certain antibiotic medicine called neomycin, which fights infection.  Some medicines used to treat diabetes,  tuberculosis, gout, and high cholesterol. RISK FACTORS Risk factors are things that make you more likely to develop a vitamin B12 deficiency. They include:  Being older than 35.  Being a vegetarian.  Being pregnant and a vegetarian or having a poor diet.  Taking certain drugs.  Being an alcoholic. SYMPTOMS You may have a vitamin B12 deficiency with no symptoms. However, a vitamin B12 deficiency can cause health problems like anemia and nerve damage. These health problems can lead to many possible symptoms, including:  Weakness.  Fatigue.  Loss of appetite.  Weight loss.  Numbness or tingling in your hands and feet.  Redness and burning of the tongue.  Confusion or memory problems.  Depression.  Dizziness.  Sensory problems, such as loss of taste, color blindness, and ringing in the ears.  Diarrhea or constipation.  Trouble walking. DIAGNOSIS Various types of tests can be given to help find the cause of your vitamin B12 deficiency. These tests include:  A complete blood count (CBC). This test gives your caregiver an overall picture of what makes up your blood.  A blood test to measure your B12 level.  A blood test to measure intrinsic factor.  An endoscopy. This procedure uses a thin tube with a camera on the end to look into your stomach or intestines. TREATMENT Treatment for vitamin B12 deficiency depends on what is causing it. Common options include:  Changing your eating and drinking habits, such as:  Eating more foods that contain  vitamin B12.  Not drinking as much alcohol or any alcohol.  Taking vitamin B12 supplements. Your caregiver will tell you what dose is best for you.  Getting vitamin B12 injections. Some people get these a few times a week. Others get them once a month. HOME CARE INSTRUCTIONS  Take all supplements as directed by your caregiver. Follow the directions carefully.  Get any injections your caregiver prescribes. Do not miss your  appointments.  Eat lots of healthy foods that contain vitamin B12. Ask your caregiver if you should work with a nutritionist. Good things to include in your diet are:  Meat.  Poultry.  Fish.  Eggs.  Fortified cereal and dairy products. This means vitamin B12 has been added to the food. Check the label on the package to be sure.  Do not abuse alcohol.  Keep all follow-up appointments. Your caregiver will need to perform blood tests to make sure your vitamin B12 deficiency is going away. SEEK MEDICAL CARE IF:  You have any questions about your treatment.  Your symptoms come back. MAKE SURE YOU:  Understand these instructions.  Will watch your condition.  Will get help right away if you are not doing well or get worse.   This information is not intended to replace advice given to you by your health care provider. Make sure you discuss any questions you have with your health care provider.   Document Released: 03/29/2011 Document Reviewed: 05/22/2014 Elsevier Interactive Patient Education [employer].

## 2015-11-28 ENCOUNTER — Encounter (HOSPITAL_COMMUNITY): Payer: Self-pay

## 2015-11-28 ENCOUNTER — Observation Stay (HOSPITAL_COMMUNITY)
Admission: AD | Admit: 2015-11-28 | Discharge: 2015-11-29 | Disposition: A | Discharge status: Home / Self Care | Payer: Medicare Other | Source: Ambulatory Visit | Attending: Family Medicine | Admitting: Family Medicine

## 2015-11-28 ENCOUNTER — Telehealth: Payer: Self-pay | Admitting: *Deleted

## 2015-11-28 DIAGNOSIS — E538 Deficiency of other specified B group vitamins: Secondary | ICD-10-CM | POA: Insufficient documentation

## 2015-11-28 DIAGNOSIS — F419 Anxiety disorder, unspecified: Secondary | ICD-10-CM | POA: Diagnosis not present

## 2015-11-28 DIAGNOSIS — I10 Essential (primary) hypertension: Secondary | ICD-10-CM | POA: Diagnosis not present

## 2015-11-28 DIAGNOSIS — G629 Polyneuropathy, unspecified: Secondary | ICD-10-CM | POA: Diagnosis not present

## 2015-11-28 DIAGNOSIS — D519 Vitamin B12 deficiency anemia, unspecified: Secondary | ICD-10-CM | POA: Diagnosis not present

## 2015-11-28 DIAGNOSIS — N179 Acute kidney failure, unspecified: Secondary | ICD-10-CM | POA: Diagnosis not present

## 2015-11-28 DIAGNOSIS — M130 Polyarthritis, unspecified: Secondary | ICD-10-CM | POA: Diagnosis not present

## 2015-11-28 DIAGNOSIS — E785 Hyperlipidemia, unspecified: Secondary | ICD-10-CM | POA: Insufficient documentation

## 2015-11-28 DIAGNOSIS — Z79899 Other long term (current) drug therapy: Secondary | ICD-10-CM | POA: Diagnosis not present

## 2015-11-28 DIAGNOSIS — D539 Nutritional anemia, unspecified: Principal | ICD-10-CM | POA: Insufficient documentation

## 2015-11-28 DIAGNOSIS — D510 Vitamin B12 deficiency anemia due to intrinsic factor deficiency: Secondary | ICD-10-CM | POA: Diagnosis present

## 2015-11-28 DIAGNOSIS — D696 Thrombocytopenia, unspecified: Secondary | ICD-10-CM | POA: Diagnosis not present

## 2015-11-28 DIAGNOSIS — R590 Localized enlarged lymph nodes: Secondary | ICD-10-CM | POA: Insufficient documentation

## 2015-11-28 DIAGNOSIS — E559 Vitamin D deficiency, unspecified: Secondary | ICD-10-CM | POA: Diagnosis not present

## 2015-11-28 DIAGNOSIS — K140 Glossitis: Secondary | ICD-10-CM | POA: Diagnosis present

## 2015-11-28 DIAGNOSIS — Z7951 Long term (current) use of inhaled steroids: Secondary | ICD-10-CM | POA: Diagnosis not present

## 2015-11-28 DIAGNOSIS — J309 Allergic rhinitis, unspecified: Secondary | ICD-10-CM | POA: Diagnosis not present

## 2015-11-28 DIAGNOSIS — K219 Gastro-esophageal reflux disease without esophagitis: Secondary | ICD-10-CM | POA: Diagnosis not present

## 2015-11-28 DIAGNOSIS — F329 Major depressive disorder, single episode, unspecified: Secondary | ICD-10-CM | POA: Insufficient documentation

## 2015-11-28 LAB — HEPATIC FUNCTION PANEL
ALT: 25 U/L (ref 14–54)
AST: 65 U/L — ABNORMAL HIGH (ref 15–41)
Albumin: 4.4 g/dL (ref 3.5–5.0)
Alkaline Phosphatase: 49 U/L (ref 38–126)
Bilirubin, Direct: <0.1 mg/dL — ABNORMAL LOW (ref 0.1–0.5)
Total Bilirubin: 0.8 mg/dL (ref 0.3–1.2)
Total Protein: 7.7 g/dL (ref 6.5–8.1)

## 2015-11-28 LAB — IRON AND TIBC
%SAT: 43 % (ref 11–50)
Iron: 91 ug/dL (ref 45–160)
TIBC: 210 ug/dL — ABNORMAL LOW (ref 250–450)
UIBC: 119 ug/dL — ABNORMAL LOW (ref 125–400)

## 2015-11-28 LAB — OCCULT BLOOD X 1 CARD TO LAB, STOOL: Fecal Occult Bld: NEGATIVE

## 2015-11-28 LAB — SAVE SMEAR

## 2015-11-28 LAB — BASIC METABOLIC PANEL WITH GFR
BUN: 18 mg/dL (ref 7–25)
CO2: 31 mmol/L (ref 20–31)
Calcium: 9.5 mg/dL (ref 8.6–10.4)
Chloride: 99 mmol/L (ref 98–110)
Creat: 0.96 mg/dL (ref 0.50–0.99)
GFR, Est African American: 71 mL/min (ref >=60)
GFR, Est Non African American: 61 mL/min (ref >=60)
Glucose, Bld: 108 mg/dL — ABNORMAL HIGH (ref 65–99)
Potassium: 4.1 mmol/L (ref 3.5–5.3)
Sodium: 138 mmol/L (ref 135–146)

## 2015-11-28 LAB — VITAMIN D 25 HYDROXY (VIT D DEFICIENCY, FRACTURES): Vit D, 25-Hydroxy: 18 ng/mL — ABNORMAL LOW (ref 30–100)

## 2015-11-28 LAB — CBC
HCT: 17.8 % — ABNORMAL LOW (ref 36.0–46.0)
Hemoglobin: 5.8 g/dL — CL (ref 12.0–15.0)
MCH: 36.3 pg — ABNORMAL HIGH (ref 26.0–34.0)
MCHC: 32.6 g/dL (ref 30.0–36.0)
MCV: 111.3 fL — ABNORMAL HIGH (ref 78.0–100.0)
Platelets: 162 10*3/uL (ref 150–400)
RBC: 1.6 MIL/uL — ABNORMAL LOW (ref 3.87–5.11)
RDW: 20.9 % — ABNORMAL HIGH (ref 11.5–15.5)
WBC: 3.4 10*3/uL — ABNORMAL LOW (ref 4.0–10.5)

## 2015-11-28 LAB — RETICULOCYTES
RBC.: 1.6 MIL/uL — ABNORMAL LOW (ref 3.87–5.11)
Retic Count, Absolute: 27.2 10*3/uL (ref 19.0–186.0)
Retic Ct Pct: 1.7 % (ref 0.4–3.1)

## 2015-11-28 LAB — PREPARE RBC (CROSSMATCH)

## 2015-11-28 LAB — ABO/RH: ABO/RH(D): B POS

## 2015-11-28 LAB — LACTATE DEHYDROGENASE: LDH: 1714 U/L — ABNORMAL HIGH (ref 98–192)

## 2015-11-28 MED ORDER — SIMVASTATIN 40 MG PO TABS
40.0000 mg | ORAL_TABLET | Freq: Every day | ORAL | Status: DC
  Administered 2015-11-28: 40 mg via ORAL
  Filled 2015-11-28: qty 1

## 2015-11-28 MED ORDER — CLONIDINE HCL 0.1 MG PO TABS
0.1000 mg | ORAL_TABLET | Freq: Two times a day (BID) | ORAL | Status: DC
  Administered 2015-11-28 – 2015-11-29 (×2): 0.1 mg via ORAL
  Filled 2015-11-28 (×2): qty 1

## 2015-11-28 MED ORDER — LISINOPRIL 40 MG PO TABS
40.0000 mg | ORAL_TABLET | Freq: Every day | ORAL | Status: DC
  Administered 2015-11-29: 40 mg via ORAL
  Filled 2015-11-28: qty 1

## 2015-11-28 MED ORDER — VITAMIN B-12 1000 MCG PO TABS
1000.0000 ug | ORAL_TABLET | Freq: Every day | ORAL | Status: DC
  Administered 2015-11-29: 1000 ug via ORAL
  Filled 2015-11-28: qty 1

## 2015-11-28 MED ORDER — SODIUM CHLORIDE 0.9% FLUSH
3.0000 mL | Freq: Two times a day (BID) | INTRAVENOUS | Status: DC
  Administered 2015-11-28 – 2015-11-29 (×3): 3 mL via INTRAVENOUS

## 2015-11-28 MED ORDER — LISINOPRIL-HYDROCHLOROTHIAZIDE 20-12.5 MG PO TABS: 2.0000 | ORAL_TABLET | Freq: Every day | ORAL | Status: DC

## 2015-11-28 MED ORDER — SODIUM CHLORIDE 0.9 % IV SOLN
Freq: Once | INTRAVENOUS | Status: AC
Start: 2015-11-28 — End: 2015-11-28
  Administered 2015-11-28: 16:00:00 via INTRAVENOUS

## 2015-11-28 MED ORDER — PAROXETINE HCL 20 MG PO TABS
40.0000 mg | ORAL_TABLET | Freq: Every day | ORAL | Status: DC
  Filled 2015-11-28: qty 2

## 2015-11-28 MED ORDER — HYDROCHLOROTHIAZIDE 25 MG PO TABS
25.0000 mg | ORAL_TABLET | Freq: Every day | ORAL | Status: DC
  Administered 2015-11-29: 25 mg via ORAL
  Filled 2015-11-28: qty 1

## 2015-11-28 MED ORDER — GABAPENTIN 600 MG PO TABS
300.0000 mg | ORAL_TABLET | Freq: Two times a day (BID) | ORAL | Status: DC
  Filled 2015-11-28: qty 1

## 2015-11-28 MED ORDER — LORATADINE 10 MG PO TABS
10.0000 mg | ORAL_TABLET | Freq: Every day | ORAL | Status: DC
  Filled 2015-11-28: qty 1

## 2015-11-28 NOTE — Telephone Encounter (Signed)
Talked with patient on phone regarding direct admission. Currently there are no beds available and patient will have to wait for this morning's discharges, which could be a few hours.   Patient currently said that she feels "fine" with no change. No chest pain, shortness of breath, dizziness, or palpitations. No active sites of bleedings.  Discussed reasons to go to the ED prior to being directly admitted including chest pain, shortness of breath, dizziness, palpitations, or active bleeding. Patient voiced understanding and had no further questions.  Algis Greenhouse. Jerline Pain, [facility] Resident PGY-3 11/28/2015 9:27 AM

## 2015-11-28 NOTE — Assessment & Plan Note (Signed)
Could be contributing to fatigue Recheck level

## 2015-11-28 NOTE — Assessment & Plan Note (Signed)
Likely cause of mouth pain and lack of taste, given exam Related to B12 deficiency Recheck B12 B12 shot given today in clinic

## 2015-11-28 NOTE — H&P (Signed)
[facility] Admission History and Physical Service Pager: [phone]  Patient name: Jasmine Gregory Medical record number: [id number] Date of birth: 12/15/1950 Age: 65 y.o. Gender: female  Primary Care Provider: Lavon Paganini, MD Consultants: None Code Status: Full (confirmed on admission)  Chief Complaint: Anemia  Assessment and Plan: Jasmine Gregory is a 65 y.o. female presenting with anemia. PMH is significant for B12 deficiency, HTN, HLD, Anxiety/Depression, GERD, and peripheral neuropathy.  #Macrocytic Anemia / B12 Deficiency:  Hgb 5.8, MCV 108.2 yesterday with 2 weeks of worsening fatigue and burning tongue (glossitis). Otherwise stable. B12 level 146, folate normal. Iron panel with no signs of iron deficiency. Was given a B12 shot in clinic yesterday. Patient does not appear to be symptomatic at present and is well appearing with normal mentation w/o respiratory distress. Patient agreeable to transfusion. No obvious signs of acute bleed. Patient states last colonoscopy was normal but cannot recall when she is due. No report on file. Says patient received coloscopy in 2007 and due in 10 years per chart. No FHx of colon cancer. Stable.  --Type and screen --Will give 4 units pRBC  --Recheck CBC --Check peripheral smear to rule out hemolysis --Check retic count --Haptoglobin pending --Lactic dehydrogenase pending --Will obtain rectal FOBT --After transfusion, will likely need B12 shots weekly for the next month -follow-up AM labs and post-transfusion H&H  #HTN:  Takes lisinopril-HCTZ and clonidine at home. BP poorly controlled at clinic visit yesterday. BP 120-140/90s.  --Lisinopril-HCTZ 20-12.5 QD --Clonidine 0.1 mg BID -if not well controlled consider maximizing above therapies  #HLD: Last lipid panel 03/2015 was wnl. --Simvastatin 40 mg QD  #Anxiety / Depression: Takes Paxil 20 mg at home  --Paxil 20 mg QD  #GERD: Not on any medications  currently, would avoid PPI given situation  #Peripheral Neuropathy: Possibly related to B12 deficiency --Continue home gabapentin 300mg  BID --Replace B12 as above   #Allergic Rhinitis: Takes zyrtec and Flonase at home. --Zyrtec 10 mg QD  FEN/GI: Regular diet, SLIV Prophylaxis: SCDs  Disposition: Placed in observation pending above management.   History of Present Illness:  Jasmine Gregory is a 65 y.o. female presenting with anemia. PMH is significant for B12 deficiency, HTN, HLD, Anxiety/Depression, GERD, and peripheral neuropathy.   Patient seen in clinic yesterday with 2 week history of tongue irritation and decreased taste. Also with increased fatigue over this time. She had labs drawn at that time which were significant for hgb 5.8, MCV 108.2, B12 146, and normal iron studies. Patient was contacted to be directly admitted for observation for blood transfusion. This morning, patient denies any chest pain, shortness of breath, dizziness, or palpations. Does have some feelings of lightheadedness at times but denies syncopal episodes. No sites of active bleeding. Says she had a bowel movement 3 days ago with minimal blood which she attributed to straining. This happens to her occasionally. No melena or hematochezia since. Patient says she takes iron pill for anemia. Has never had a transfusion before.  Review Of Systems: Complete ROS performed. Please refer to HPI for pertinent. Review of Systems  Constitutional: Negative for fever.  Respiratory: Positive for shortness of breath.   Cardiovascular: Negative for chest pain.  Gastrointestinal: Positive for constipation. Negative for nausea.  Neurological: Negative for dizziness.    Patient Active Problem List   Diagnosis Date Noted  . Candida infection of genital region 11/13/2015  . Atrophic glossitis 04/04/2015  . Hot flashes 04/04/2015  . Breast lump on left  side at 1 o'clock position 01/21/2015  . Healthcare maintenance 09/20/2012   . Polyarthropathy 12/20/2011  . Cutaneous lupus erythematosus 11/11/2011  . Hypovitaminosis D 07/20/2011  . Vitamin B 12 deficiency 07/20/2011  . Lateral meniscus derangement 01/29/2011  . Pain in joint, lower leg 01/29/2011  . Benign paroxysmal positional vertigo 01/29/2011  . Back pain 09/10/2010  . Peripheral neuropathy ([place]) 06/12/2010  . Allergic rhinitis 10/27/2009  . Anemia 05/20/2009  . DEGENERATIVE JOINT DISEASE 03/24/2009  . OBESITY 05/26/2006  . Anxiety and depression 05/26/2006  . HYPERTENSION, BENIGN ESSENTIAL 05/26/2006  . HEMORRHOIDS 05/26/2006  . HLD (hyperlipidemia) 03/17/2006  . GASTROESOPHAGEAL REFLUX, NO ESOPHAGITIS 03/17/2006    Past Medical History: Past Medical History:  Diagnosis Date  . Anemia   . Arthritis   . Depression   . Diverticulosis   . GERD (gastroesophageal reflux disease)   . Hemorrhoids   . Hyperlipidemia   . Hypovitaminosis D 07/20/2011   Vitamin D was 17 in May 2012. Patient has not taken any Vitamin D supplement since that time.     . Obesity   . Vitamin B 12 deficiency 07/20/2011   Vitamin B12 was low at 169 in May 2012. No evidence of supplementation since that time.     Past Surgical History: No past surgical history on file.  Social History: Social History  Substance Use Topics  . Smoking status: Never Smoker  . Smokeless tobacco: Never Used  . Alcohol use No   Additional social history: Please also refer to relevant sections of EMR.  Family History: No family history on file. No family history for colon cancer.  Allergies and Medications: No Known Allergies No current facility-administered medications on file prior to encounter.    Current Outpatient Prescriptions on File Prior to Encounter  Medication Sig Dispense Refill  . cetirizine (ZYRTEC) 10 MG tablet Take 10 mg by mouth daily.      . clobetasol cream (TEMOVATE) [id number] % Apply 1 application topically daily.    . cloNIDine (CATAPRES) 0.1 MG tablet Take 1 tablet  (0.1 mg total) by mouth 2 (two) times daily. 60 tablet 1  . diclofenac (VOLTAREN) 75 MG EC tablet Take 75 mg by mouth daily as needed.    . ergocalciferol (VITAMIN D2) 50000 units capsule Take 1 capsule (50,000 Units total) by mouth once a week. To take x 12 wks 4 capsule 2  . ferrous sulfate 325 (65 FE) MG tablet Take 325 mg by mouth 2 (two) times daily.      . fluticasone (FLONASE) 50 MCG/ACT nasal spray 2 sprays by Nasal route daily. To both nostrils     . gabapentin (NEURONTIN) 600 MG tablet Take 0.5 tablets (300 mg total) by mouth 2 (two) times daily. 30 tablet 2  . lisinopril-hydrochlorothiazide (ZESTORETIC) 20-12.5 MG tablet Take 2 tablets by mouth daily. 180 tablet 3  . naproxen sodium (ANAPROX) 220 MG tablet 1 tab by mouth 2-3 times a day as needed for pain    . nystatin cream (MYCOSTATIN) Apply 1 application topically 2 (two) times daily. To itching area for yeast 30 g 1  . PARoxetine (PAXIL) 20 MG tablet Take 2 tablets (40 mg total) by mouth daily. 60 tablet 3  . simvastatin (ZOCOR) 40 MG tablet TAKE ONE TABLET BY MOUTH AT BEDTIME 30 tablet 0  . traMADol (ULTRAM) 50 MG tablet Take 1 tablet (50 mg total) by mouth every 8 (eight) hours as needed. 30 tablet 2  . vitamin B-12 (CYANOCOBALAMIN) 1000 MCG  tablet Take 1,000 mcg by mouth daily.    Marland Kitchen zoster vaccine live, PF, (ZOSTAVAX) 96295 UNT/0.65ML injection Inject 19,400 Units into the skin once. 1 each 0    Objective: BP (!) 126/96   Pulse 84   Temp 98.8 F (37.1 C) (Oral)   Resp 18   SpO2 100%  Exam: General: well nourished, well developed, in no acute distress with non-toxic appearance HEENT: normocephalic, atraumatic, moist mucous membranes, EOMI Neck: supple, non-tender without lymphadenopathy CV: holosystolic murmur with normal rhythm without murmurs, rubs, or gallops, pulses intact Lungs: clear to auscultation bilaterally with normal work of breathing, no wheezes or crackles Abdomen: soft, non-tender, no masses or  organomegaly palpable, normoactive bowel sounds Skin: warm, dry, no rashes or lesions, cap refill < 2 seconds Extremities: warm and well perfused, normal tone, no edema. Non-tender.  Neuro: Grossly non-focal. Strength intact. A&Ox3.  Rectal: stool without gross blood, no hemorrhoids or fistulas present, normal rectal tone  Labs and Imaging: Results for orders placed or performed during the hospital encounter of 11/28/15 (from the past 24 hour(s))  CBC     Status: Abnormal   Collection Time: 11/28/15  2:27 PM  Result Value Ref Range   WBC 3.4 (L) 4.0 - 10.5 K/uL   RBC 1.60 (L) 3.87 - 5.11 MIL/uL   Hemoglobin 5.8 (LL) 12.0 - 15.0 g/dL   HCT 17.8 (L) 36.0 - 46.0 %   MCV 111.3 (H) 78.0 - 100.0 fL   MCH 36.3 (H) 26.0 - 34.0 pg   MCHC 32.6 30.0 - 36.0 g/dL   RDW 20.9 (H) 11.5 - 15.5 %   Platelets 162 150 - 400 K/uL  Save smear     Status: None   Collection Time: 11/28/15  2:27 PM  Result Value Ref Range   Smear Review SMEAR STAINED AND AVAILABLE FOR REVIEW   Reticulocytes     Status: Abnormal   Collection Time: 11/28/15  2:27 PM  Result Value Ref Range   Retic Ct Pct 1.7 0.4 - 3.1 %   RBC. 1.60 (L) 3.87 - 5.11 MIL/uL   Retic Count, Manual 27.2 19.0 - 186.0 K/uL  Type and screen     Status: None (Preliminary result)   Collection Time: 11/28/15  2:31 PM  Result Value Ref Range   ABO/RH(D) B POS    Antibody Screen NEG    Sample Expiration 12/01/2015    Unit Number [id number]    Blood Component Type RED CELLS,LR    Unit division 00    Status of Unit ALLOCATED    Transfusion Status OK TO TRANSFUSE    Crossmatch Result Compatible    Unit Number [id number]    Blood Component Type RED CELLS,LR    Unit division 00    Status of Unit ISSUED    Transfusion Status OK TO TRANSFUSE    Crossmatch Result Compatible   Prepare RBC     Status: None   Collection Time: 11/28/15  2:31 PM  Result Value Ref Range   Order Confirmation ORDER PROCESSED BY BLOOD BANK   ABO/Rh     Status: None    Collection Time: 11/28/15  2:31 PM  Result Value Ref Range   ABO/RH(D) B POS   Occult blood card to lab, stool Provider will collect     Status: None   Collection Time: 11/28/15  3:47 PM  Result Value Ref Range   Fecal Occult Bld NEGATIVE NEGATIVE  Lactate dehydrogenase     Status: Abnormal  Collection Time: 11/28/15  3:59 PM  Result Value Ref Range   LDH 1,714 (H) 98 - 192 U/L   No results found.   Dimmitt Bing, DO 11/28/2015, 4:24 PM PGY-1, [place] Intern pager: [phone], text pages welcome   FPTS Upper-Level Resident Addendum  I have independently interviewed and examined the patient. I have discussed the above with the original author and agree with their documentation. My edits for correction/addition/clarification are in pink. Please see also any attending notes.   Katheren Shams, DO PGY-2, [place] Service pager: [phone] (text pages welcome through [facility])

## 2015-11-28 NOTE — Assessment & Plan Note (Signed)
Suspect fatigue and glossitis related to poor B12 intake and pernicious anemia B12 shot given in clinic today Recheck B12

## 2015-11-28 NOTE — Assessment & Plan Note (Signed)
Uncontrolled even on manual recheck Increase Lisinopril dose to 40 mg daily Continue HCTZ 25 mg daily F/u in 4 wks BMP today

## 2015-11-28 NOTE — Assessment & Plan Note (Addendum)
Suspect pernicious anemia Patient denies any blood loss Recheck CBC Recheck B12, folate, and iron panel

## 2015-11-28 NOTE — Telephone Encounter (Signed)
Called patient to discuss critical hemoglobin of 5.8. We will plan for direct admission to [facility] on FPTS for transfusion and further management.  She had stable vital signs and appeared stable at visit in clinic with me yesterday.  She denies lightheadedness.  She has had hemorroidal bleedingi ntermittently with straining for years, but no other bleeding.  Of note, she appears pancytopenic on labs with low B12 level.  Cross cover to work on direct admission and call patient back with more information.  Would recommend ED if there will be significant delay in direct admission.  Already discussed with FPTS attending, Dr Ardelia Mems.  Jasmine Crews, MD, MPH PGY-3,  [facility] 11/28/2015 8:48 AM

## 2015-11-28 NOTE — Telephone Encounter (Signed)
Received call from [facility] this morning reporting a critical Hgb on Jasmine Gregory of 5.8. Reported results to Dr Brita Romp at 8:35 am

## 2015-11-29 DIAGNOSIS — E785 Hyperlipidemia, unspecified: Secondary | ICD-10-CM | POA: Diagnosis not present

## 2015-11-29 DIAGNOSIS — F419 Anxiety disorder, unspecified: Secondary | ICD-10-CM | POA: Diagnosis not present

## 2015-11-29 DIAGNOSIS — R590 Localized enlarged lymph nodes: Secondary | ICD-10-CM | POA: Diagnosis not present

## 2015-11-29 DIAGNOSIS — D539 Nutritional anemia, unspecified: Secondary | ICD-10-CM | POA: Diagnosis not present

## 2015-11-29 DIAGNOSIS — E538 Deficiency of other specified B group vitamins: Secondary | ICD-10-CM | POA: Diagnosis not present

## 2015-11-29 DIAGNOSIS — I10 Essential (primary) hypertension: Secondary | ICD-10-CM | POA: Diagnosis not present

## 2015-11-29 DIAGNOSIS — D519 Vitamin B12 deficiency anemia, unspecified: Secondary | ICD-10-CM | POA: Diagnosis not present

## 2015-11-29 LAB — CBC
HCT: 31.5 % — ABNORMAL LOW (ref 36.0–46.0)
HCT: 29.3 % — ABNORMAL LOW (ref 36.0–46.0)
Hemoglobin: 10.8 g/dL — ABNORMAL LOW (ref 12.0–15.0)
Hemoglobin: 10.1 g/dL — ABNORMAL LOW (ref 12.0–15.0)
MCH: 32.6 pg (ref 26.0–34.0)
MCH: 32.8 pg (ref 26.0–34.0)
MCHC: 34.3 g/dL (ref 30.0–36.0)
MCHC: 34.5 g/dL (ref 30.0–36.0)
MCV: 95.2 fL (ref 78.0–100.0)
MCV: 95.1 fL (ref 78.0–100.0)
Platelets: 126 10*3/uL — ABNORMAL LOW (ref 150–400)
Platelets: 124 10*3/uL — ABNORMAL LOW (ref 150–400)
RBC: 3.31 MIL/uL — ABNORMAL LOW (ref 3.87–5.11)
RBC: 3.08 MIL/uL — ABNORMAL LOW (ref 3.87–5.11)
RDW: 25.3 % — ABNORMAL HIGH (ref 11.5–15.5)
RDW: 25.6 % — ABNORMAL HIGH (ref 11.5–15.5)
WBC: 5.3 10*3/uL (ref 4.0–10.5)
WBC: 5.4 10*3/uL (ref 4.0–10.5)

## 2015-11-29 LAB — BASIC METABOLIC PANEL
Anion gap: 6 (ref 5–15)
BUN: 14 mg/dL (ref 6–20)
CO2: 29 mmol/L (ref 22–32)
Calcium: 10 mg/dL (ref 8.9–10.3)
Chloride: 102 mmol/L (ref 101–111)
Creatinine, Ser: 1.27 mg/dL — ABNORMAL HIGH (ref 0.44–1.00)
GFR calc Af Amer: 49 mL/min — ABNORMAL LOW (ref >60)
GFR calc non Af Amer: 43 mL/min — ABNORMAL LOW (ref >60)
Glucose, Bld: 104 mg/dL — ABNORMAL HIGH (ref 65–99)
Potassium: 4 mmol/L (ref 3.5–5.1)
Sodium: 137 mmol/L (ref 135–145)

## 2015-11-29 LAB — RETICULOCYTES
RBC.: 3.3 MIL/uL — ABNORMAL LOW (ref 3.87–5.11)
Retic Count, Absolute: 52.8 10*3/uL (ref 19.0–186.0)
Retic Ct Pct: 1.6 % (ref 0.4–3.1)

## 2015-11-29 LAB — PROTIME-INR
INR: 0.97
Prothrombin Time: 12.9 seconds (ref 11.4–15.2)

## 2015-11-29 LAB — HAPTOGLOBIN: Haptoglobin: <10 mg/dL — ABNORMAL LOW (ref 34–200)

## 2015-11-29 NOTE — Discharge Summary (Signed)
[facility] Discharge Summary  Patient name: Jasmine Gregory Medical record number: [id number] Date of birth: November 17, 1950 Age: 65 y.o. Gender: female Date of Admission: 11/28/2015  Date of Discharge: 11/29/15 Admitting Physician: Leeanne Rio, MD  Primary Care Provider: Lavon Paganini, MD Consultants: None  Indication for Hospitalization: Symptomatic macrocytic anemia  Discharge Diagnoses/Problem List:  Macrocytic anemia 2/2 to B12 deficiency AKI HTN HLD Anxiety/depression GERD Peripheral neuropathy Allergic Rhinitis Axillary Lymphadenopathy Thrombocytopenia  Disposition: Home  Discharge Condition: Stable, improved  Discharge Exam:  General: In no acute distress, sitting up in bed talking with her niece Cardiovascular: Regular rate and rhythm, no murmur, no edema, 2+ DP pulses bilaterally Respiratory: Normal work of breathing, clear to auscultation bilaterally Abdomen: Soft, nondistended, nontender, normal bowel sounds Extremities: Moves all extremities spontaneously, no edema  Brief Hospital Course:  Jasmine Gregory is a 65 year old female presenting to clinic with glossitis and fatigue, who was found on lab work to be anemic with a hemoglobin of 5.8. She was directly admitted for further management. We thought her macrocytic anemia was likely secondary to B12 deficiency, as her B12 was 146 and folate was normal. She also had glossitis, which is typical of B12 deficiency She was given 4 units pRBCs and her hemoglobin improved from 5.8 to 10.8. We obtained an FOBT, which was negative. We also obtained peripheral smear, reticulocyte count, haptoglobin, and LDH. LDH was markedly elevated at 1,714, which can be seen in B12 deficiency due to megaloblastic hemolysis. Haptoglobin was low at <10. Reticulocyte count was inappropriately low at 1.6%. Peripheral smear was pending at the time of discharge. We also checked antibodies to intrinsic factor, as  pernicious anemia may be the cause of her B12 deficiency. We spoke briefly with GI to see if they would want to perform any type of a work-up while she is hospitalized, and they stated she could follow-up as an outpatient.  Pt was also noted to have mild axillary lymphadenopathy bilaterally. She underwent diagnostic mammography and ultrasound in January 2017 for a lump in her breast and the findings were benign. She may benefit from outpatient diagnostic mammogram and ultrasound to reassess axillary lymphadenopathy.  Issues for Follow Up:  1. Hgb improved from 5.8 to 10.8 after 4 units of pRBCs. Recommend rechecking H/H at follow-up appt. 2. Pt will likely need B12 shots weekly for the next month 3. Peripheral smear and intrinsic factor antibodies were pending at the time of discharge. Please follow-up with these. 4. If pernicious anemia is confirmed, Pt will need outpatient GI referral for EGD. 5. Pt noted to have mild axillary lymphadenopathy bilaterally. Consider outpatient diagnostic mammogram and ultrasound to assess. 6. Pt was noted to have Diclofenac and Naproxen on her med list. She states she was also taking Aleve prn for headaches. I advised that patient stop all NSAIDs on discharge.  Significant Procedures: Transfusion of 4 units pRBCs  Significant Labs and Imaging:   Recent Labs Lab 11/28/15 1427 11/29/15 0854 11/29/15 1614  WBC 3.4* 5.3 5.4  HGB 5.8* 10.8* 10.1*  HCT 17.8* 31.5* 29.3*  PLT 162 126* 124*    Recent Labs Lab 11/27/15 1634 11/28/15 1849 11/29/15 0854  NA 138  --  137  K 4.1  --  4.0  CL 99  --  102  CO2 31  --  29  GLUCOSE 108*  --  104*  BUN 18  --  14  CREATININE 0.96  --  1.27*  CALCIUM 9.5  --  10.0  ALKPHOS  --  49  --   AST  --  65*  --   ALT  --  25  --   ALBUMIN  --  4.4  --    LDH 1,714 Haptoglobin < 10 Reticulocytes 1.6% FOBT negative  Results/Tests Pending at Time of Discharge: Peripheral blood smear and intrinsic factor  antibodies.  Discharge Medications:    Medication List    STOP taking these medications   diclofenac 75 MG EC tablet Commonly known as:  VOLTAREN   gabapentin 600 MG tablet Commonly known as:  NEURONTIN   naproxen sodium 220 MG tablet Commonly known as:  ANAPROX   nystatin cream Commonly known as:  MYCOSTATIN   traMADol 50 MG tablet Commonly known as:  ULTRAM   zoster vaccine live (PF) 19400 UNT/0.65ML injection Commonly known as:  ZOSTAVAX     TAKE these medications   cetirizine 10 MG tablet Commonly known as:  ZYRTEC Take 10 mg by mouth daily as needed.   clobetasol cream 0.05 % Commonly known as:  TEMOVATE Apply 1 application topically daily.   cloNIDine 0.1 MG tablet Commonly known as:  CATAPRES Take 1 tablet (0.1 mg total) by mouth 2 (two) times daily.   ergocalciferol 50000 units capsule Commonly known as:  VITAMIN D2 Take 1 capsule (50,000 Units total) by mouth once a week. To take x 12 wks   ferrous sulfate 325 (65 FE) MG tablet Take 325 mg by mouth 2 (two) times daily.   fluticasone 50 MCG/ACT nasal spray Commonly known as:  FLONASE 2 sprays by Nasal route daily. To both nostrils   hydroxypropyl methylcellulose / hypromellose 2.5 % ophthalmic solution Commonly known as:  ISOPTO TEARS / GONIOVISC Place 1 drop into both eyes daily as needed for dry eyes.   lisinopril-hydrochlorothiazide 20-12.5 MG tablet Commonly known as:  ZESTORETIC Take 2 tablets by mouth daily.   PARoxetine 20 MG tablet Commonly known as:  PAXIL Take 2 tablets (40 mg total) by mouth daily.   simvastatin 40 MG tablet Commonly known as:  ZOCOR TAKE ONE TABLET BY MOUTH AT BEDTIME   vitamin B-12 1000 MCG tablet Commonly known as:  CYANOCOBALAMIN Take 1,000 mcg by mouth daily.       Discharge Instructions: Please refer to Patient Instructions section of EMR for full details.  Patient was counseled important signs and symptoms that should prompt return to medical care,  changes in medications, dietary instructions, activity restrictions, and follow up appointments.   Follow-Up Appointments: Follow-up Information    Lavon Paganini, MD Follow up on 12/04/2015.   Specialty:  Family Medicine Why:  hospital follow-up appointment at 3:00PM Contact information: [place] [id number] [phone]           Edmar Blankenburg Dodd Levaughn Puccinelli, MD 11/29/2015, 6:30 PM PGY-2, [place]

## 2015-11-29 NOTE — Progress Notes (Signed)
Family Medicine Teaching Service Daily Progress Note Intern Pager: [phone]  Patient name: Jasmine Gregory Medical record number: [id number] Date of birth: November 30, 1950 Age: 65 y.o. Gender: female  Primary Care Provider: Lavon Paganini, MD Consultants: None  Code Status: Full  Pt Overview and Major Events to Date:  11/10: Admit to FPTS  Assessment and Plan:  Macrocytic Anemia / B12 Deficiency: Hgb 5.8, MCV 108.2 on admission with 2 weeks of worsening fatigue and burning tongue (glossitis).  B12 level 146, folate normal. Iron panel with no signs of iron deficiency. Patient does not appear to be symptomatic at present and is well appearing with normal mentation w/o respiratory distress. No obvious signs of acute bleed. S/p 4 units pRBC overnight. Post tranfusion Hgb 10.8. Interestingly LDH is 1,714 and haptoglobin is low at <10. Retic count normal. FOBT negative, unlikely GI bleed.  --Await pathologist review of smear review --PO B12 - Pernicious anemia antibodies  - Discuss with GI about B12 deficiency, if this is pernicious anemia is there any further work up that's warranted such as imaging? - check CBC this afternoon  AKI: Cr 0.96>1.27 after 4 units. ?fluid overload from 4 units of blood. Does not appear edematous on exam - Check BMP at follow up  HTN: Takes lisinopril-HCTZ and clonidine at home. Normotensive.  -Lisinopril-HCTZ 20-12.5 QD -Clonidine 0.1 mg BID  HLD: Last lipid panel 03/2015 was wnl. -Simvastatin 40 mg QD  Anxiety / Depression: Takes Paxil 20 mg at home  -Paxil 20 mg QD  GERD: Not on any medications currently, would avoid PPI given situation  Peripheral Neuropathy: Possibly related to B12 deficiency --Continue home gabapentin 300mg  BID --Replace B12 as above   Allergic Rhinitis: Takes zyrtec and Flonase at home. --Zyrtec 10 mg QD  Axillary lymphadenopathy:  -outpatient diagnostic mammogram & ultrasound to reassess   Thrombocytopenia: Plt went  from 162>126 - Continue to monitor  FEN/GI: Regular diet, SLIV Prophylaxis: SCDs  Disposition: Possibly home today  Subjective:  Patient had no acute events overnight. No complaints this morning. Eating normally. Patient wondering what all her blood work has shown so far.  Objective: Temp:  [98.2 F (36.8 C)-99.6 F (37.6 C)] 98.7 F (37.1 C) (11/11 0703) Pulse Rate:  [60-96] 60 (11/11 0703) Resp:  [18-19] 18 (11/11 0703) BP: (123-148)/(52-97) 131/58 (11/11 0703) SpO2:  [99 %-100 %] 100 % (11/11 0703) Weight:  [200 lb (90.7 kg)] 200 lb (90.7 kg) (11/10 1551) Physical Exam: General: In no acute distress, sitting up in bed talking with her niece Cardiovascular: Regular rate and rhythm, no murmur, no edema, 2+ DP pulses bilaterally Respiratory: Normal work of breathing, clear to auscultation bilaterally Abdomen: Soft, nondistended, nontender, normal bowel sounds Extremities: Moves all extremities spontaneously, no edema  Laboratory:  Recent Labs Lab 11/27/15 1634 11/28/15 1427 11/29/15 0854  WBC 2.9* 3.4* 5.3  HGB 5.8* 5.8* 10.8*  HCT 17.2* 17.8* 31.5*  PLT 154 162 126*    Recent Labs Lab 11/27/15 1634 11/28/15 1849 11/29/15 0854  NA 138  --  137  K 4.1  --  4.0  CL 99  --  102  CO2 31  --  29  BUN 18  --  14  CREATININE 0.96  --  1.27*  CALCIUM 9.5  --  10.0  PROT  --  7.7  --   BILITOT  --  0.8  --   ALKPHOS  --  49  --   ALT  --  25  --  AST  --  65*  --   GLUCOSE 108*  --  104*    Haptoglobin; less than 10 LDH; 1714  Imaging/Diagnostic Tests: No results found.   Carlyle Dolly, MD 11/29/2015, 9:41 AM PGY-2, [place] Intern pager: [phone], text pages welcome

## 2015-11-29 NOTE — Progress Notes (Signed)
Nsg Discharge Note  Admit Date:  11/28/2015 Discharge date: 11/29/2015   Jasmine Gregory to be D/C'd Home per MD order.  AVS completed.  Copy for chart, and copy for patient signed, and dated. Patient/caregiver able to verbalize understanding.  Discharge Medication:   Medication List    STOP taking these medications   diclofenac 75 MG EC tablet Commonly known as:  VOLTAREN   gabapentin 600 MG tablet Commonly known as:  NEURONTIN   naproxen sodium 220 MG tablet Commonly known as:  ANAPROX   nystatin cream Commonly known as:  MYCOSTATIN   traMADol 50 MG tablet Commonly known as:  ULTRAM   zoster vaccine live (PF) 19400 UNT/0.65ML injection Commonly known as:  ZOSTAVAX     TAKE these medications   cetirizine 10 MG tablet Commonly known as:  ZYRTEC Take 10 mg by mouth daily as needed.   clobetasol cream 0.05 % Commonly known as:  TEMOVATE Apply 1 application topically daily.   cloNIDine 0.1 MG tablet Commonly known as:  CATAPRES Take 1 tablet (0.1 mg total) by mouth 2 (two) times daily.   ergocalciferol 50000 units capsule Commonly known as:  VITAMIN D2 Take 1 capsule (50,000 Units total) by mouth once a week. To take x 12 wks   ferrous sulfate 325 (65 FE) MG tablet Take 325 mg by mouth 2 (two) times daily.   fluticasone 50 MCG/ACT nasal spray Commonly known as:  FLONASE 2 sprays by Nasal route daily. To both nostrils   hydroxypropyl methylcellulose / hypromellose 2.5 % ophthalmic solution Commonly known as:  ISOPTO TEARS / GONIOVISC Place 1 drop into both eyes daily as needed for dry eyes.   lisinopril-hydrochlorothiazide 20-12.5 MG tablet Commonly known as:  ZESTORETIC Take 2 tablets by mouth daily.   PARoxetine 20 MG tablet Commonly known as:  PAXIL Take 2 tablets (40 mg total) by mouth daily.   simvastatin 40 MG tablet Commonly known as:  ZOCOR TAKE ONE TABLET BY MOUTH AT BEDTIME   vitamin B-12 1000 MCG tablet Commonly known as:   CYANOCOBALAMIN Take 1,000 mcg by mouth daily.       Discharge Assessment: Vitals:   11/29/15 0703 11/29/15 1308  BP: (!) 131/58 (!) 143/64  Pulse: 60 (!) 59  Resp: 18 17  Temp: 98.7 F (37.1 C) 98.1 F (36.7 C)   Skin clean, dry and intact without evidence of skin break down, no evidence of skin tears noted. IV catheter discontinued intact. Site without signs and symptoms of complications - no redness or edema noted at insertion site, patient denies c/o pain - Dressing with slight pressure applied.  D/c Instructions-Education: Discharge instructions given to patient/family with verbalized understanding. D/c education completed with patient/family including follow up instructions, medication list, d/c activities limitations if indicated, with other d/c instructions as indicated by MD - patient able to verbalize understanding, all questions fully answered. Patient instructed to return to ED, call 911, or call MD for any changes in condition.  Patient escorted via [facility], and D/C home via private auto.  Dimas Chyle, RN 11/29/2015 6:56 PM

## 2015-11-29 NOTE — Progress Notes (Signed)
Lab called and notified to complete blood work at [phone]. Order for post blood H and H placed.

## 2015-11-29 NOTE — Discharge Instructions (Signed)
It was so nice to meet you!  You were hospitalized because your hemoglobin was very low. We gave you 4 units of blood and your hemoglobin came back up to a normal level.  Your primary care doctor may recommend that you see a stomach doctor. Until you discuss with your primary care doctor, please STOP taking the Diclofenac and the Naproxen. You should also stop taking any other NSAIDs including Ibuprofen and Aleve.

## 2015-11-30 LAB — TYPE AND SCREEN
ABO/RH(D): B POS
Antibody Screen: NEGATIVE
Unit division: 0
Unit division: 0
Unit division: 0
Unit division: 0

## 2015-12-01 LAB — PATHOLOGIST SMEAR REVIEW

## 2015-12-01 LAB — INTRINSIC FACTOR ANTIBODIES: Intrinsic Factor: 48 AU/mL — ABNORMAL HIGH (ref 0.0–1.1)

## 2015-12-04 ENCOUNTER — Encounter: Payer: Self-pay | Admitting: Family Medicine

## 2015-12-04 ENCOUNTER — Encounter: Payer: Self-pay | Admitting: Gastroenterology

## 2015-12-04 ENCOUNTER — Ambulatory Visit (INDEPENDENT_AMBULATORY_CARE_PROVIDER_SITE_OTHER): Payer: Medicare Other | Admitting: Family Medicine

## 2015-12-04 VITALS — BP 140/68 | HR 124 | Temp 98.1°F | Ht 63.0 in | Wt 198.6 lb

## 2015-12-04 DIAGNOSIS — J309 Allergic rhinitis, unspecified: Secondary | ICD-10-CM

## 2015-12-04 DIAGNOSIS — I10 Essential (primary) hypertension: Secondary | ICD-10-CM

## 2015-12-04 DIAGNOSIS — D510 Vitamin B12 deficiency anemia due to intrinsic factor deficiency: Secondary | ICD-10-CM

## 2015-12-04 DIAGNOSIS — K144 Atrophy of tongue papillae: Secondary | ICD-10-CM | POA: Diagnosis not present

## 2015-12-04 DIAGNOSIS — R590 Localized enlarged lymph nodes: Secondary | ICD-10-CM | POA: Diagnosis not present

## 2015-12-04 LAB — BASIC METABOLIC PANEL WITH GFR
BUN: 22 mg/dL (ref 7–25)
CO2: 33 mmol/L — ABNORMAL HIGH (ref 20–31)
Calcium: 10.1 mg/dL (ref 8.6–10.4)
Chloride: 99 mmol/L (ref 98–110)
Creat: 1.13 mg/dL — ABNORMAL HIGH (ref 0.50–0.99)
GFR, Est African American: 58 mL/min — ABNORMAL LOW (ref >=60)
GFR, Est Non African American: 50 mL/min — ABNORMAL LOW (ref >=60)
Glucose, Bld: 115 mg/dL — ABNORMAL HIGH (ref 65–99)
Potassium: 4.1 mmol/L (ref 3.5–5.3)
Sodium: 138 mmol/L (ref 135–146)

## 2015-12-04 LAB — CBC
HCT: 35.7 % (ref 35.0–45.0)
Hemoglobin: 12 g/dL (ref 11.7–15.5)
MCH: 32.6 pg (ref 27.0–33.0)
MCHC: 33.6 g/dL (ref 32.0–36.0)
MCV: 97 fL (ref 80.0–100.0)
MPV: 10.4 fL (ref 7.5–12.5)
Platelets: 226 10*3/uL (ref 140–400)
RBC: 3.68 MIL/uL — ABNORMAL LOW (ref 3.80–5.10)
RDW: 20.5 % — ABNORMAL HIGH (ref 11.0–15.0)
WBC: 5 10*3/uL (ref 3.8–10.8)

## 2015-12-04 MED ORDER — CYANOCOBALAMIN 1000 MCG/ML IJ SOLN
1000.0000 ug | Freq: Once | INTRAMUSCULAR | Status: AC
  Administered 2015-12-04: 1000 ug via INTRAMUSCULAR

## 2015-12-04 MED ORDER — FLUTICASONE PROPIONATE 50 MCG/ACT NA SUSP: 2.0000 | Freq: Every day | NASAL | 11 refills | Status: DC

## 2015-12-04 MED ORDER — CYANOCOBALAMIN 1000 MCG/ML IJ SOLN: 1000.0000 ug | Freq: Once | INTRAMUSCULAR | Status: DC

## 2015-12-04 MED ORDER — ERGOCALCIFEROL 1.25 MG (50000 UT) PO CAPS: 50000.0000 [IU] | ORAL_CAPSULE | ORAL | 2 refills | Status: DC

## 2015-12-04 NOTE — Progress Notes (Signed)
   Subjective:   Jasmine Gregory is a 65 y.o. female with a history of HTN, peripheral neuropathy, B-12 deficiency anemia, atrophic glossitis here for hospital follow-up for pernicious anemia  Anemia  - Feeling better after blood transfusions and B-12 injection - has More energy - Tongue feels thick and strange that she has noticed that is less glossy and smooth - Taste is still decreased - Thinks she is less pale than she was previously even though she had noticed it until it was resolved   Sinus pressure White cloudy nasal drainage in AM Seems to worse now that she is turned on her heat  Axillary adenopathy Patient reports she has not felt any adenopathy She had a lump in her left breast that she felt last year but this has gone away  she was reassured by normal mammogram in 01/2015  Review of Systems:  Per HPI.   Social History: never smoker  Objective:  BP 140/68   Pulse (!) 124   Temp 98.1 F (36.7 C) (Oral)   Ht 5\' 3"  (1.6 m)   Wt 198 lb 9.6 oz (90.1 kg)   BMI 35.18 kg/m   Gen:  64 y.o. female in NAD HEENT: NCAT, MMM, EOMI, PERRL, anicteric sclerae, OP clear, no conjunctival pallor, atrophic glossitis noted CV: RRR, no MRG Resp: Non-labored, CTAB, no wheezes noted Abd: Soft, NTND, BS present, no guarding or organomegaly Ext: WWP, no edema, 1 enlarged palpable lymph node in left axilla Breasts: No palpable lumps in either breast MSK: No obvious deformities, gait intact Neuro: Alert and oriented, speech normal       Chemistry      Component Value Date/Time   NA 137 11/29/2015 0854   K 4.0 11/29/2015 0854   CL 102 11/29/2015 0854   CO2 29 11/29/2015 0854   BUN 14 11/29/2015 0854   CREATININE 1.27 (H) 11/29/2015 0854   CREATININE 0.96 11/27/2015 1634      Component Value Date/Time   CALCIUM 10.0 11/29/2015 0854   ALKPHOS 49 11/28/2015 1849   AST 65 (H) 11/28/2015 1849   ALT 25 11/28/2015 1849   BILITOT 0.8 11/28/2015 1849      Lab Results  Component  Value Date   WBC 5.4 11/29/2015   HGB 10.1 (L) 11/29/2015   HCT 29.3 (L) 11/29/2015   MCV 95.1 11/29/2015   PLT 124 (L) 11/29/2015   Lab Results  Component Value Date   TSH 1.20 04/04/2015   Lab Results  Component Value Date   HGBA1C 5.6 04/04/2015   Assessment & Plan:     Jasmine Gregory is a 65 y.o. female here for   HYPERTENSION, BENIGN ESSENTIAL Slightly above goal today Continue current medications as this could be a stress reaction Recheck at RN visit next week  Allergic rhinitis Suspect congestion is related to allergic rhinitis  Flonase prescribed  Atrophic glossitis Starting to improve with B-12 supplementation  Axillary adenopathy Diagnostic mammogram and breast and axillary ultrasound on left side  Pernicious anemia B-12 injection #2 of 4 today Repeat weekly for the next 2 weeks and then monthly Referral to GI for possible EGD Recheck CBC Recheck bmet as creatinine was elevated at last check likely related to fluid shifts Follow-up in 6 weeks   [name], MD MPH PGY-3,  [facility] 12/04/2015  4:45 PM

## 2015-12-04 NOTE — Assessment & Plan Note (Signed)
Starting to improve with B-12 supplementation

## 2015-12-04 NOTE — Assessment & Plan Note (Signed)
Suspect congestion is related to allergic rhinitis  Flonase prescribed

## 2015-12-04 NOTE — Assessment & Plan Note (Signed)
Diagnostic mammogram and breast and axillary ultrasound on left side

## 2015-12-04 NOTE — Assessment & Plan Note (Signed)
B-12 injection #2 of 4 today Repeat weekly for the next 2 weeks and then monthly Referral to GI for possible EGD Recheck CBC Recheck bmet as creatinine was elevated at last check likely related to fluid shifts Follow-up in 6 weeks

## 2015-12-04 NOTE — Assessment & Plan Note (Signed)
Slightly above goal today Continue current medications as this could be a stress reaction Recheck at RN visit next week

## 2015-12-04 NOTE — Patient Instructions (Signed)
Nice to see you again today. We are giving you a B-12 injection today. You need to schedule a nurse visit for next week and the week after to get B-12 injections at that time. You should also check your blood pressure at that time so we can see if it is well controlled.  We are getting labs today and someone will call you or send you a letter with the results when they're available.  I'm referring you to gastroenterology for your anemia. They will do an upper endoscopy. You may also be due for colonoscopy.  We are scheduling a breasts and underarms ultrasound and mammogram.  I will see back in 6 weeks.  Take care, Dr. Jacinto Reap

## 2015-12-05 ENCOUNTER — Encounter: Payer: Self-pay | Admitting: Family Medicine

## 2015-12-10 ENCOUNTER — Telehealth: Payer: Self-pay | Admitting: *Deleted

## 2015-12-10 ENCOUNTER — Encounter: Payer: Self-pay | Admitting: Gastroenterology

## 2015-12-10 ENCOUNTER — Other Ambulatory Visit (INDEPENDENT_AMBULATORY_CARE_PROVIDER_SITE_OTHER): Payer: Medicare Other

## 2015-12-10 ENCOUNTER — Ambulatory Visit (INDEPENDENT_AMBULATORY_CARE_PROVIDER_SITE_OTHER): Payer: Medicare Other | Admitting: Gastroenterology

## 2015-12-10 VITALS — BP 142/70 | HR 80 | Ht 63.0 in | Wt 199.6 lb

## 2015-12-10 DIAGNOSIS — R110 Nausea: Secondary | ICD-10-CM

## 2015-12-10 DIAGNOSIS — K219 Gastro-esophageal reflux disease without esophagitis: Secondary | ICD-10-CM

## 2015-12-10 DIAGNOSIS — D519 Vitamin B12 deficiency anemia, unspecified: Secondary | ICD-10-CM | POA: Diagnosis not present

## 2015-12-10 LAB — IGA: IgA: 449 mg/dL — ABNORMAL HIGH (ref 68–378)

## 2015-12-10 MED ORDER — RANITIDINE HCL 150 MG PO TABS: 150.0000 mg | ORAL_TABLET | Freq: Every day | ORAL | 5 refills | Status: DC

## 2015-12-10 NOTE — Telephone Encounter (Signed)
Rescheduled EGD from [facility] to [facility]-  Patient aware

## 2015-12-10 NOTE — Patient Instructions (Signed)
You have been scheduled for an endoscopy. Please follow written instructions given to you at your visit today. If you use inhalers (even only as needed), please bring them with you on the day of your procedure. Your physician has requested that you go to www.startemmi.com and enter the access code given to you at your visit today. This web site gives a general overview about your procedure. However, you should still follow specific instructions given to you by our office regarding your preparation for the procedure.  Go to the basement for labs today  Discontinue ferrus sulfate (iron)  We will send in Zantac to your pharmacy

## 2015-12-10 NOTE — Progress Notes (Addendum)
12/10/2015 Jasmine Gregory [id number] 10-Jan-1951   HISTORY OF PRESENT ILLNESS:  This is a 65 year old female who is new to our practice. She had history with [facility] and says that she had a colonoscopy probably within the past 10 years that she reports was normal to her knowledge. We are trying to obtain those records. She is here today at the request of her PCP, Dr. Brita Romp, for evaluation regarding recent anemia and vitamin B12 deficiency. 2 weeks ago she was feeling bad and was found have a hemoglobin of 5.8 g. Was transfused 4 units of packed red blood cells. Hemoglobin is now up to 12.0 g. Her MCV was high at 108. Folate and iron studies are normal. Vitamin B12 is low at 149. Intrinsic factor antibody was high.  She says that she has been taking iron supplements on-and-off and recently had started taking ferrous sulfate 325 mg twice daily on her own again since she had been feeling bad and thought that it may help. She says that while she is taking the iron supplements it makes her stools dark and makes her constipated so then when she has hard stool she will see some small amounts of bright red blood, but otherwise denies any sign of dark or bloody stools.  She was actually just Hemoccult negative earlier this month.  She is now on Vitamin B12 supplements by her PCP as well.  She does report reflux and indigestion with a lot of belching and nausea for at least the past year. Reports poor appetite with some weight loss of at least 15-20 pounds. She says that she just gets a sensation of nausea and thinks that she would feel better if she could vomit. She uses Rolaids or Alka-Seltzer on an as-needed basis for heartburn and indigestion, but does not take anything daily for preventative measures.   Past Medical History:  Diagnosis Date  . Anemia   . Arthritis   . Depression   . Diverticulosis   . GERD (gastroesophageal reflux disease)   . Hemorrhoids   . Hyperlipidemia   .  Hypovitaminosis D 07/20/2011   Vitamin D was 17 in May 2012. Patient has not taken any Vitamin D supplement since that time.     . Obesity   . Vitamin B 12 deficiency 07/20/2011   Vitamin B12 was low at 169 in May 2012. No evidence of supplementation since that time.    Past Surgical History:  Procedure Laterality Date  . NO PAST SURGERIES      reports that she has never smoked. She has never used smokeless tobacco. She reports that she does not drink alcohol or use drugs. family history includes Breast cancer in her maternal aunt; Melanoma in her mother; Prostate cancer in her maternal uncle. No Known Allergies    Outpatient Encounter Prescriptions as of 12/10/2015  Medication Sig  . cetirizine (ZYRTEC) 10 MG tablet Take 10 mg by mouth daily as needed.   . ergocalciferol (VITAMIN D2) 50000 units capsule Take 1 capsule (50,000 Units total) by mouth once a week. To take x 12 wks  . ferrous sulfate 325 (65 FE) MG tablet Take 325 mg by mouth 2 (two) times daily.    . fluticasone (FLONASE) 50 MCG/ACT nasal spray Place 2 sprays into both nostrils daily. To both nostrils  . hydroxypropyl methylcellulose / hypromellose (ISOPTO TEARS / GONIOVISC) 2.5 % ophthalmic solution Place 1 drop into both eyes daily as needed for dry eyes.  Marland Kitchen lisinopril-hydrochlorothiazide (  ZESTORETIC) 20-12.5 MG tablet Take 2 tablets by mouth daily.  Marland Kitchen PARoxetine (PAXIL) 20 MG tablet Take 2 tablets (40 mg total) by mouth daily.  . simvastatin (ZOCOR) 40 MG tablet TAKE ONE TABLET BY MOUTH AT BEDTIME  . vitamin B-12 (CYANOCOBALAMIN) 1000 MCG tablet Take 1,000 mcg by mouth daily.  . ranitidine (ZANTAC) 150 MG tablet Take 1 tablet (150 mg total) by mouth daily. 30 minutes before dinner  . [DISCONTINUED] clobetasol cream (TEMOVATE) [id number] % Apply 1 application topically daily.  . [DISCONTINUED] cloNIDine (CATAPRES) 0.1 MG tablet Take 1 tablet (0.1 mg total) by mouth 2 (two) times daily. (Patient not taking: Reported on 11/28/2015)    No facility-administered encounter medications on file as of 12/10/2015.      REVIEW OF SYSTEMS  : All other systems reviewed and negative except where noted in the History of Present Illness.   PHYSICAL EXAM: BP (!) 142/70   Pulse 80   Ht 5\' 3"  (1.6 m)   Wt 199 lb 9.6 oz (90.5 kg)   BMI 35.36 kg/m  General: Well developed female in no acute distress Head: Normocephalic and atraumatic Eyes:  Sclerae anicteric, conjunctiva pink. Ears: Normal auditory acuity Lungs: Clear throughout to auscultation Heart: Regular rate and rhythm Abdomen: Soft, non-distended.  BS present.  Mild epigastric TTP. Musculoskeletal: Symmetrical with no gross deformities  Skin: No lesions on visible extremities Extremities: No edema  Neurological: Alert oriented x 4, grossly non-focal Psychological:  Alert and cooperative. Normal mood and affect  ASSESSMENT AND PLAN: -Pernicious anemia/Vitamin B 12 deficiency:  Positive intrinsic factor antibody. Recently had a hemoglobin of 5.8 g for which she was transfused with 4 units packed red blood cells. Her MCV was high at 108. Iron studies are normal, folate normal, vitamin B12 low. She is on iron supplements but I believe she can discontinue those which will resolve her constipation and dark stools.  Is on Vitamin B12 supplements per PCP.  Will schedule EGD for evaluation/small bowel biopsies to rule out celiac disease.  Will also check celiac labs. -GERD/nausea:  I will start her on Zantac 150 mg daily before dinner. I am skeptical to use PPIs in the setting of her known vitamin B12 deficiency as PPIs can be associated with this as well. She will be scheduled for EGD to evaluate as well and can have small bowel to rule out celiac disease also.  *The risks, benefits, and alternatives to EGD were discussed with the patient and she consents to proceed.  *Will get records from her colonoscopy from [facility].  **We obtained records from [facility]. She had a  colonoscopy in August 2007 by Dr. Michail Sermon at which time she was found to have small internal hemorrhoids and multiple small and large mouth diverticula in the sigmoid colon. Repeat colonoscopy was recommended in 3 years for screening purposes, however, since the quality of the prep was fair except the cecum which was unsatisfactory.  CC:  [name], MD

## 2015-12-12 LAB — TISSUE TRANSGLUTAMINASE, IGA: Tissue Transglutaminase Ab, IgA: 1 U/mL (ref <4)

## 2015-12-15 ENCOUNTER — Ambulatory Visit
Admission: RE | Admit: 2015-12-15 | Discharge: 2015-12-15 | Disposition: A | Discharge status: Home / Self Care | Payer: Medicare Other | Source: Ambulatory Visit | Attending: Family Medicine | Admitting: Family Medicine

## 2015-12-15 ENCOUNTER — Ambulatory Visit (INDEPENDENT_AMBULATORY_CARE_PROVIDER_SITE_OTHER): Payer: Medicare Other | Admitting: *Deleted

## 2015-12-15 DIAGNOSIS — D519 Vitamin B12 deficiency anemia, unspecified: Secondary | ICD-10-CM | POA: Diagnosis present

## 2015-12-15 DIAGNOSIS — R928 Other abnormal and inconclusive findings on diagnostic imaging of breast: Secondary | ICD-10-CM | POA: Diagnosis not present

## 2015-12-15 DIAGNOSIS — R590 Localized enlarged lymph nodes: Secondary | ICD-10-CM

## 2015-12-15 DIAGNOSIS — N6489 Other specified disorders of breast: Secondary | ICD-10-CM | POA: Diagnosis not present

## 2015-12-15 MED ORDER — CYANOCOBALAMIN 1000 MCG/ML IJ SOLN
1000.0000 ug | Freq: Once | INTRAMUSCULAR | Status: AC
Start: 2015-12-15 — End: 2015-12-15
  Administered 2015-12-15: 1000 ug via INTRAMUSCULAR

## 2015-12-15 NOTE — Progress Notes (Signed)
   Patient in nurse clinic for Vitamin B-12 injection.  Today is #3 out of 4.  Patient reported she is feeling a lot better and able to taste something.  Patient to schedule injection #4 for Monday 12/22/15. Will forward to PCP.  Derl Barrow, RN

## 2015-12-16 ENCOUNTER — Encounter: Payer: Medicare Other | Admitting: Gastroenterology

## 2015-12-16 ENCOUNTER — Encounter: Payer: Self-pay | Admitting: Family Medicine

## 2015-12-19 ENCOUNTER — Telehealth: Payer: Self-pay | Admitting: *Deleted

## 2015-12-19 NOTE — Telephone Encounter (Signed)
===  View-only below this line===  ----- Message ----- From: Loralie Champagne, PA-C Sent: 12/19/2015   8:49 AM To: Oda Kilts, CMA  Sorry, her colon was August 2007, not 2017 (I was using dragon).  Yes, you can see if she wants to reschedule.  There was no urgency to her procedures so it would be ok to reschedule to January.  Thank you,  Jess  ----- Message ----- From: Oda Kilts, CMA Sent: 12/19/2015   8:39 AM To: Loralie Champagne, PA-C  Aug 2017?  He has no spot for colon and endo at the same time until 1-12  Ill check with patient and see if she wants to cancel endo and reschedule to have both at the same time or do the procedures separate. Ill need to verify that date  You said last colon Aug 2017 but not due for three years? Or did you mean 3 months?? ----- Message ----- From: Loralie Champagne, PA-C Sent: 12/18/2015   5:19 PM To: Oda Kilts, CMA  This patient is scheduled for an EGD next week with Dr. Ardis Hughs.  We obtained her colonoscopy records and her last colonoscopy was in August 2017 with actually repeat recommended in 3 years due to inadequate prep at that time. So she is due for colonoscopy as well.  Please reschedule her for both EGD and colonoscopy at the same time and she will need to be instructed for colonoscopy.  Reason for colonoscopy is screening.  Thank you,  Jess   Spoke with patient, she wants to continue with the EGD on 12-5. Does not want to schedule the colonoscopy at this time. Wants to wait and call back to schedule the Colonoscopy.

## 2015-12-22 ENCOUNTER — Ambulatory Visit (INDEPENDENT_AMBULATORY_CARE_PROVIDER_SITE_OTHER): Payer: Medicare Other | Admitting: *Deleted

## 2015-12-22 DIAGNOSIS — D510 Vitamin B12 deficiency anemia due to intrinsic factor deficiency: Secondary | ICD-10-CM

## 2015-12-22 MED ORDER — CYANOCOBALAMIN 1000 MCG/ML IJ SOLN
1000.0000 ug | Freq: Once | INTRAMUSCULAR | Status: AC
  Administered 2015-12-22: 1000 ug via INTRAMUSCULAR

## 2015-12-22 NOTE — Progress Notes (Signed)
Patient in today for B-12 injection #4, injection given in left deltoid, patient tolerated well. Will follow up with PCP in 1 month to have levels rechecked.

## 2015-12-23 ENCOUNTER — Encounter: Payer: Self-pay | Admitting: Gastroenterology

## 2015-12-23 ENCOUNTER — Ambulatory Visit (AMBULATORY_SURGERY_CENTER): Payer: Medicare Other | Admitting: Gastroenterology

## 2015-12-23 VITALS — BP 152/78 | HR 61 | Temp 98.6°F | Resp 17 | Ht 63.0 in | Wt 199.0 lb

## 2015-12-23 DIAGNOSIS — K219 Gastro-esophageal reflux disease without esophagitis: Secondary | ICD-10-CM

## 2015-12-23 DIAGNOSIS — D510 Vitamin B12 deficiency anemia due to intrinsic factor deficiency: Secondary | ICD-10-CM | POA: Diagnosis not present

## 2015-12-23 DIAGNOSIS — K317 Polyp of stomach and duodenum: Secondary | ICD-10-CM

## 2015-12-23 DIAGNOSIS — K2970 Gastritis, unspecified, without bleeding: Secondary | ICD-10-CM

## 2015-12-23 DIAGNOSIS — K2990 Gastroduodenitis, unspecified, without bleeding: Secondary | ICD-10-CM

## 2015-12-23 DIAGNOSIS — I10 Essential (primary) hypertension: Secondary | ICD-10-CM | POA: Diagnosis not present

## 2015-12-23 DIAGNOSIS — K3189 Other diseases of stomach and duodenum: Secondary | ICD-10-CM | POA: Diagnosis not present

## 2015-12-23 MED ORDER — SODIUM CHLORIDE 0.9 % IV SOLN: 500.0000 mL | INTRAVENOUS | Status: DC

## 2015-12-23 NOTE — Progress Notes (Signed)
Called to room to assist during endoscopic procedure.  Patient ID and intended procedure confirmed with present staff. Received instructions for my participation in the procedure from the performing physician.  

## 2015-12-23 NOTE — Addendum Note (Signed)
Addended by: Evonnie Pat A on: 12/23/2015 11:45 AM   Modules accepted: Orders

## 2015-12-23 NOTE — Op Note (Signed)
[facility] Patient Name: Jasmine Gregory Procedure Date: 12/23/2015 8:25 AM MRN: [id number] Endoscopist: Milus Banister , MD Age: 65 Referring MD:  Date of Birth: [birth year] Gender: Female Account #: [id number] Procedure:                Upper GI endoscopy Indications:              Pernicious anemia Medicines:                Monitored Anesthesia Care Procedure:                Pre-Anesthesia Assessment:                           - Prior to the procedure, a History and Physical                            was performed, and patient medications and                            allergies were reviewed. The patient's tolerance of                            previous anesthesia was also reviewed. The risks                            and benefits of the procedure and the sedation                            options and risks were discussed with the patient.                            All questions were answered, and informed consent                            was obtained. Prior Anticoagulants: The patient has                            taken no previous anticoagulant or antiplatelet                            agents. ASA Grade Assessment: II - A patient with                            mild systemic disease. After reviewing the risks                            and benefits, the patient was deemed in                            satisfactory condition to undergo the procedure.                           After obtaining informed consent, the endoscope was  passed under direct vision. Throughout the                            procedure, the patient's blood pressure, pulse, and                            oxygen saturations were monitored continuously. The                            Model GIF-[id number]) scope was introduced                            through the mouth, and advanced to the second part                            of duodenum. The upper GI endoscopy  was                            accomplished without difficulty. The patient                            tolerated the procedure well. Scope In: Scope Out: Findings:                 The esophagus was normal.                           Mild inflammation characterized by erythema and                            friability was found in the gastric antrum.                            Biopsies were taken with a cold forceps for                            histology. (jar 2)                           One 8 mm sessile polyp was found on the lesser                            curvature of the stomach. Biopsies were taken with                            a cold forceps for histology. (jar 3)                           The examined duodenum was normal. Biopsies for                            histology were taken with a cold forceps for                            evaluation of celiac disease. (  jar 1)                           A small hiatal hernia was present.                           The exam was otherwise without abnormality. Complications:            No immediate complications. Estimated blood loss:                            None. Estimated Blood Loss:     Estimated blood loss: none. Impression:               - Normal esophagus.                           - Gastritis. Biopsied.                           - One gastric polyp. Biopsied.                           - Normal examined duodenum. Biopsied.                           - Small hiatal hernia.                           - The examination was otherwise normal. Recommendation:           - Patient has a contact number available for                            emergencies. The signs and symptoms of potential                            delayed complications were discussed with the                            patient. Return to normal activities tomorrow.                            Written discharge instructions were provided to the                             patient.                           - Resume previous diet.                           - Continue present medications.                           - Await pathology results.                           - My office will contact you about scheduling  colonoscopy (your last was 10 years ago and at that                            time Dr. Michail Sermon recommended repeat examination in                            3 years due to suboptimal prep) Milus Banister, MD 12/23/2015 8:46:52 AM This report has been signed electronically.

## 2015-12-23 NOTE — Patient Instructions (Signed)
YOU HAD AN ENDOSCOPIC PROCEDURE TODAY AT [facility]:   Refer to the procedure report that was given to you for any specific questions about what was found during the examination.  If the procedure report does not answer your questions, please call your gastroenterologist to clarify.  If you requested that your care partner not be given the details of your procedure findings, then the procedure report has been included in a sealed envelope for you to review at your convenience later.  YOU SHOULD EXPECT: Some feelings of bloating in the abdomen. Passage of more gas than usual.  Walking can help get rid of the air that was put into your GI tract during the procedure and reduce the bloating.  Please Note:  You might notice some irritation and congestion in your nose or some drainage.  This is from the oxygen used during your procedure.  There is no need for concern and it should clear up in a day or so.  SYMPTOMS TO REPORT IMMEDIATELY:  Following upper endoscopy (EGD)  Vomiting of blood or coffee ground material  New chest pain or pain under the shoulder blades  Painful or persistently difficult swallowing  New shortness of breath  Fever of 100F or higher  Black, tarry-looking stools  For urgent or emergent issues, a gastroenterologist can be reached at any hour by calling [phone].   DIET:  We do recommend a small meal at first, but then you may proceed to your regular diet.  Drink plenty of fluids but you should avoid alcoholic beverages for 24 hours.  ACTIVITY:  You should plan to take it easy for the rest of today and you should NOT DRIVE or use heavy machinery until tomorrow (because of the sedation medicines used during the test).    FOLLOW UP: Our staff will call the number listed on your records the next business day following your procedure to check on you and address any questions or concerns that you may have regarding the information given to you following  your procedure. If we do not reach you, we will leave a message.  However, if you are feeling well and you are not experiencing any problems, there is no need to return our call.  We will assume that you have returned to your regular daily activities without incident.  If any biopsies were taken you will be contacted by phone or by letter within the next 1-3 weeks.  Please call us at [phone] if you have not heard about the biopsies in 3 weeks.   SIGNATURES/CONFIDENTIALITY: You and/or your care partner have signed paperwork which will be entered into your electronic medical record.  These signatures attest to the fact that that the information above on your After Visit Summary has been reviewed and is understood.  Full responsibility of the confidentiality of this discharge information lies with you and/or your care-partner.  Await pathology  Please read over handouts about gastritis and hiatal hernias

## 2015-12-23 NOTE — Progress Notes (Signed)
Report to PACU, RN, vss, BBS= Clear.  

## 2015-12-24 ENCOUNTER — Telehealth: Payer: Self-pay

## 2015-12-24 NOTE — Telephone Encounter (Signed)
  Follow up Call-  Call back number 12/23/2015  Post procedure Call Back phone  # [phone]  Permission to leave phone message No  comments VM not working  Some recent data might be hidden     Patient questions:  Do you have a fever, pain , or abdominal swelling? No. Pain Score  0 *  Have you tolerated food without any problems? Yes.    Have you been able to return to your normal activities? Yes.    Do you have any questions about your discharge instructions: Diet   No. Medications  No. Follow up visit  No.  Do you have questions or concerns about your Care? No.  Actions: * If pain score is 4 or above: No action needed, pain <4.

## 2015-12-25 ENCOUNTER — Telehealth: Payer: Self-pay

## 2015-12-25 NOTE — Telephone Encounter (Signed)
My office will contact you about scheduling colonoscopy (your last was 10 years ago and at that time Dr. Michail Sermon recommended repeat examination in 3 years due to suboptimal prep)

## 2015-12-26 ENCOUNTER — Encounter: Payer: Self-pay | Admitting: Gastroenterology

## 2015-12-26 NOTE — Telephone Encounter (Signed)
Pre visit and colon have been scheduled.  Pt was notified

## 2015-12-29 ENCOUNTER — Encounter: Payer: Self-pay | Admitting: Gastroenterology

## 2016-01-15 DIAGNOSIS — M150 Primary generalized (osteo)arthritis: Secondary | ICD-10-CM | POA: Diagnosis not present

## 2016-01-15 DIAGNOSIS — M79643 Pain in unspecified hand: Secondary | ICD-10-CM | POA: Diagnosis not present

## 2016-01-15 DIAGNOSIS — L931 Subacute cutaneous lupus erythematosus: Secondary | ICD-10-CM | POA: Diagnosis not present

## 2016-01-15 DIAGNOSIS — M25569 Pain in unspecified knee: Secondary | ICD-10-CM | POA: Diagnosis not present

## 2016-01-26 ENCOUNTER — Ambulatory Visit (INDEPENDENT_AMBULATORY_CARE_PROVIDER_SITE_OTHER): Payer: Medicare Other | Admitting: Family Medicine

## 2016-01-26 ENCOUNTER — Encounter: Payer: Self-pay | Admitting: Family Medicine

## 2016-01-26 VITALS — BP 130/78 | HR 76 | Temp 98.7°F | Ht 63.0 in | Wt 201.4 lb

## 2016-01-26 DIAGNOSIS — I10 Essential (primary) hypertension: Secondary | ICD-10-CM

## 2016-01-26 DIAGNOSIS — K144 Atrophy of tongue papillae: Secondary | ICD-10-CM | POA: Diagnosis not present

## 2016-01-26 DIAGNOSIS — D510 Vitamin B12 deficiency anemia due to intrinsic factor deficiency: Secondary | ICD-10-CM | POA: Diagnosis present

## 2016-01-26 MED ORDER — CYANOCOBALAMIN 1000 MCG/ML IJ SOLN
1000.0000 ug | Freq: Once | INTRAMUSCULAR | Status: AC
  Administered 2016-01-26: 1000 ug via INTRAMUSCULAR

## 2016-01-26 NOTE — Assessment & Plan Note (Signed)
Stable B12 injections monthly - one given today Recheck CBC in one month Continue daily oral B12 supplement F/u in 3 months

## 2016-01-26 NOTE — Patient Instructions (Signed)
Nice to see you again today. I'm glad that you're doing better. We'll do another B-12 injection today. Make monthly nurse visit to continue getting these B-12 injections. Also make a lab appointment in one month to get your blood count drawn again.  I will see you back in 3 months.  Take care, Dr. Jacinto Reap

## 2016-01-26 NOTE — Assessment & Plan Note (Signed)
Well controlled Continue current medications F/u in 3 months - BMET at that time

## 2016-01-26 NOTE — Assessment & Plan Note (Signed)
Improving with B12 supplementation

## 2016-01-26 NOTE — Progress Notes (Signed)
   Subjective:   Jasmine Gregory is a 66 y.o. female with a history of HTN, GERD, pernicious anemia, atrophic glossitis here for B12 and HTN f/u  Pernicious anemia - EGD 12/23/15 - Mild gastritis and one sessile polyp, biopsies were taken to test for celiac disease - Surgical pathology with benign samples and no evidence of H. pylori infection or celiac disease - Tongue is feeling better and taste is back - Taking oral B12 daily - s/p weekly B12 injections x4 - due for monthly B12 injections today - energy is much improved, no pallor, no bleeding, no fevers, no rash  HTN: - Medications: lisinopril-HCTZ 40-25mg  daily - Compliance: good - Checking BP at home: no - Denies any SOB, CP, vision changes, LE edema, medication SEs, or symptoms of hypotension - Diet: cheated over the holidays as taste was improving as above - Exercise: wants to start walking daily again   Review of Systems:  Per HPI.   Social History: never smoker  Objective:  BP 130/78 (BP Location: Right Arm, Patient Position: Sitting, Cuff Size: Large)   Pulse 76   Temp 98.7 F (37.1 C) (Oral)   Ht 5\' 3"  (1.6 m)   Wt 201 lb 6.4 oz (91.4 kg)   LMP  (Exact Date)   SpO2 97%   BMI 35.68 kg/m   Gen:  66 y.o. female in NAD  HEENT: NCAT, MMM, EOMI, PERRL, anicteric sclerae, tongue normal in appearance CV: RRR, no MRG Resp: Non-labored, CTAB, no wheezes noted Abd: Soft, NTND, BS present, no guarding or organomegaly Ext: WWP, no edema MSK: Gait intact, no deformities Neuro: Alert and oriented, speech normal       Chemistry      Component Value Date/Time   NA 138 12/04/2015 1540   K 4.1 12/04/2015 1540   CL 99 12/04/2015 1540   CO2 33 (H) 12/04/2015 1540   BUN 22 12/04/2015 1540   CREATININE 1.13 (H) 12/04/2015 1540      Component Value Date/Time   CALCIUM 10.1 12/04/2015 1540   ALKPHOS 49 11/28/2015 1849   AST 65 (H) 11/28/2015 1849   ALT 25 11/28/2015 1849   BILITOT 0.8 11/28/2015 1849      Lab  Results  Component Value Date   WBC 5.0 12/04/2015   HGB 12.0 12/04/2015   HCT 35.7 12/04/2015   MCV 97.0 12/04/2015   PLT 226 12/04/2015   Lab Results  Component Value Date   TSH 1.20 04/04/2015   Lab Results  Component Value Date   HGBA1C 5.6 04/04/2015   Assessment & Plan:     Jasmine Gregory is a 66 y.o. female here for   Pernicious anemia Stable B12 injections monthly - one given today Recheck CBC in one month Continue daily oral B12 supplement F/u in 3 months  Atrophic glossitis Improving with B12 supplementation  HYPERTENSION, BENIGN ESSENTIAL Well controlled Continue current medications F/u in 3 months - BMET at that time   [name], MD MPH PGY-3,  [facility] 01/26/2016  11:49 AM

## 2016-02-25 ENCOUNTER — Other Ambulatory Visit: Payer: Medicare Other

## 2016-02-25 ENCOUNTER — Ambulatory Visit (INDEPENDENT_AMBULATORY_CARE_PROVIDER_SITE_OTHER): Payer: Medicare Other | Admitting: *Deleted

## 2016-02-25 DIAGNOSIS — D510 Vitamin B12 deficiency anemia due to intrinsic factor deficiency: Secondary | ICD-10-CM

## 2016-02-25 LAB — CBC
HCT: 36.7 % (ref 35.0–45.0)
Hemoglobin: 11.6 g/dL — ABNORMAL LOW (ref 11.7–15.5)
MCH: 28.6 pg (ref 27.0–33.0)
MCHC: 31.6 g/dL — ABNORMAL LOW (ref 32.0–36.0)
MCV: 90.4 fL (ref 80.0–100.0)
MPV: 11.3 fL (ref 7.5–12.5)
Platelets: 274 10*3/uL (ref 140–400)
RBC: 4.06 MIL/uL (ref 3.80–5.10)
RDW: 13.7 % (ref 11.0–15.0)
WBC: 7.3 10*3/uL (ref 3.8–10.8)

## 2016-02-25 MED ORDER — CYANOCOBALAMIN 1000 MCG/ML IJ SOLN
1000.0000 ug | Freq: Once | INTRAMUSCULAR | Status: AC
  Administered 2016-02-25: 1000 ug via INTRAMUSCULAR

## 2016-02-25 NOTE — Progress Notes (Signed)
   Patient in nurse clinic for monthly Vitamin B-12 injection.  Patient reports feeling a lot better, no complaints.  Cyanocobalamin 1000 mcg/ml given left deltoid.  Patient also had lab work this AM. Patient to schedule next appointment.  Derl Barrow, RN

## 2016-02-27 ENCOUNTER — Ambulatory Visit (AMBULATORY_SURGERY_CENTER): Payer: Self-pay

## 2016-02-27 VITALS — Ht 63.0 in | Wt 202.4 lb

## 2016-02-27 DIAGNOSIS — Z1211 Encounter for screening for malignant neoplasm of colon: Secondary | ICD-10-CM

## 2016-02-27 MED ORDER — SUPREP BOWEL PREP KIT 17.5-3.13-1.6 GM/177ML PO SOLN
1.0000 | Freq: Once | ORAL | 0 refills | Status: AC
Start: 2016-02-27 — End: 2016-02-27

## 2016-02-27 NOTE — Progress Notes (Signed)
No allergies to eggs or soy No past problems with anesthesia No home oxygen No diet meds  Declined emmi 

## 2016-03-12 ENCOUNTER — Encounter: Payer: Medicare Other | Admitting: Gastroenterology

## 2016-03-12 ENCOUNTER — Encounter: Payer: Self-pay | Admitting: Gastroenterology

## 2016-03-12 ENCOUNTER — Ambulatory Visit (AMBULATORY_SURGERY_CENTER): Payer: Medicare Other | Admitting: Gastroenterology

## 2016-03-12 VITALS — BP 140/74 | HR 63 | Temp 98.4°F | Resp 16 | Ht 63.0 in | Wt 202.0 lb

## 2016-03-12 DIAGNOSIS — Z1212 Encounter for screening for malignant neoplasm of rectum: Secondary | ICD-10-CM | POA: Diagnosis not present

## 2016-03-12 DIAGNOSIS — Z1211 Encounter for screening for malignant neoplasm of colon: Secondary | ICD-10-CM | POA: Diagnosis not present

## 2016-03-12 DIAGNOSIS — E669 Obesity, unspecified: Secondary | ICD-10-CM | POA: Diagnosis not present

## 2016-03-12 DIAGNOSIS — K5730 Diverticulosis of large intestine without perforation or abscess without bleeding: Secondary | ICD-10-CM | POA: Diagnosis not present

## 2016-03-12 DIAGNOSIS — D123 Benign neoplasm of transverse colon: Secondary | ICD-10-CM | POA: Diagnosis not present

## 2016-03-12 DIAGNOSIS — K219 Gastro-esophageal reflux disease without esophagitis: Secondary | ICD-10-CM | POA: Diagnosis not present

## 2016-03-12 DIAGNOSIS — I10 Essential (primary) hypertension: Secondary | ICD-10-CM | POA: Diagnosis not present

## 2016-03-12 DIAGNOSIS — M329 Systemic lupus erythematosus, unspecified: Secondary | ICD-10-CM | POA: Diagnosis not present

## 2016-03-12 MED ORDER — SODIUM CHLORIDE 0.9 % IV SOLN: 500.0000 mL | INTRAVENOUS | Status: DC

## 2016-03-12 NOTE — Patient Instructions (Signed)
Handouts:Polyps, Diverticulosis.    YOU HAD AN ENDOSCOPIC PROCEDURE TODAY AT [facility]:   Refer to the procedure report that was given to you for any specific questions about what was found during the examination.  If the procedure report does not answer your questions, please call your gastroenterologist to clarify.  If you requested that your care partner not be given the details of your procedure findings, then the procedure report has been included in a sealed envelope for you to review at your convenience later.  YOU SHOULD EXPECT: Some feelings of bloating in the abdomen. Passage of more gas than usual.  Walking can help get rid of the air that was put into your GI tract during the procedure and reduce the bloating. If you had a lower endoscopy (such as a colonoscopy or flexible sigmoidoscopy) you may notice spotting of blood in your stool or on the toilet paper. If you underwent a bowel prep for your procedure, you may not have a normal bowel movement for a few days.  Please Note:  You might notice some irritation and congestion in your nose or some drainage.  This is from the oxygen used during your procedure.  There is no need for concern and it should clear up in a day or so.  SYMPTOMS TO REPORT IMMEDIATELY:   Following lower endoscopy (colonoscopy or flexible sigmoidoscopy):  Excessive amounts of blood in the stool  Significant tenderness or worsening of abdominal pains  Swelling of the abdomen that is new, acute  Fever of 100F or higher  For urgent or emergent issues, a gastroenterologist can be reached at any hour by calling [phone].   DIET:  We do recommend a small meal at first, but then you may proceed to your regular diet.  Drink plenty of fluids but you should avoid alcoholic beverages for 24 hours.  ACTIVITY:  You should plan to take it easy for the rest of today and you should NOT DRIVE or use heavy machinery until tomorrow (because of the  sedation medicines used during the test).    FOLLOW UP: Our staff will call the number listed on your records the next business day following your procedure to check on you and address any questions or concerns that you may have regarding the information given to you following your procedure. If we do not reach you, we will leave a message.  However, if you are feeling well and you are not experiencing any problems, there is no need to return our call.  We will assume that you have returned to your regular daily activities without incident.  If any biopsies were taken you will be contacted by phone or by letter within the next 1-3 weeks.  Please call us at [phone] if you have not heard about the biopsies in 3 weeks.    SIGNATURES/CONFIDENTIALITY: You and/or your care partner have signed paperwork which will be entered into your electronic medical record.  These signatures attest to the fact that that the information above on your After Visit Summary has been reviewed and is understood.  Full responsibility of the confidentiality of this discharge information lies with you and/or your care-partner.

## 2016-03-12 NOTE — Progress Notes (Signed)
TO RECOVERY VSS REPORT TO Santiago Glad RN

## 2016-03-12 NOTE — Progress Notes (Signed)
Called to room to assist during endoscopic procedure.  Patient ID and intended procedure confirmed with present staff. Received instructions for my participation in the procedure from the performing physician.  

## 2016-03-12 NOTE — Op Note (Signed)
[facility] Patient Name: Jasmine Gregory Procedure Date: 03/12/2016 1:16 PM MRN: [id number] Endoscopist: Milus Banister , MD Age: 66 Referring MD:  Date of Birth: 1950-03-11 Gender: Female Account #: [id number] Procedure:                Colonoscopy Indications:              Screening for colorectal malignant neoplasm Medicines:                Monitored Anesthesia Care Procedure:                Pre-Anesthesia Assessment:                           - Prior to the procedure, a History and Physical                            was performed, and patient medications and                            allergies were reviewed. The patient's tolerance of                            previous anesthesia was also reviewed. The risks                            and benefits of the procedure and the sedation                            options and risks were discussed with the patient.                            All questions were answered, and informed consent                            was obtained. Prior Anticoagulants: The patient has                            taken no previous anticoagulant or antiplatelet                            agents. ASA Grade Assessment: II - A patient with                            mild systemic disease. After reviewing the risks                            and benefits, the patient was deemed in                            satisfactory condition to undergo the procedure.                           After obtaining informed consent, the colonoscope  was passed under direct vision. Throughout the                            procedure, the patient's blood pressure, pulse, and                            oxygen saturations were monitored continuously. The                            Model PCF-H190L [id number]) scope was introduced                            through the anus and advanced to the the cecum,                            identified by  appendiceal orifice and ileocecal                            valve. The colonoscopy was performed without                            difficulty. The patient tolerated the procedure                            well. The quality of the bowel preparation was                            good. The ileocecal valve, appendiceal orifice, and                            rectum were photographed. Scope In: 1:28:50 PM Scope Out: 1:37:23 PM Scope Withdrawal Time: 0 hours 6 minutes 41 seconds  Total Procedure Duration: 0 hours 8 minutes 33 seconds  Findings:                 A 5 mm polyp was found in the transverse colon. The                            polyp was sessile. The polyp was removed with a                            cold snare. Resection and retrieval were complete.                           Multiple small and large-mouthed diverticula were                            found in the sigmoid colon. There was narrowing and                            tortuosity of the colon in association with the                            diverticular openings.  Required changing to                            pediatric colonoscope to complete the examination.                           The exam was otherwise without abnormality on                            direct and retroflexion views. Complications:            No immediate complications. Estimated blood loss:                            None. Estimated Blood Loss:     Estimated blood loss: none. Impression:               - One 5 mm polyp in the transverse colon, removed                            with a cold snare. Resected and retrieved.                           - Mild to moderate diverticulosis in the sigmoid                            colon.                           - The examination was otherwise normal on direct                            and retroflexion views. Recommendation:           - Patient has a contact number available for                             emergencies. The signs and symptoms of potential                            delayed complications were discussed with the                            patient. Return to normal activities tomorrow.                            Written discharge instructions were provided to the                            patient.                           - Resume previous diet.                           - Continue present medications.  You will receive a letter within 2-3 weeks with the                            pathology results and my final recommendations.                           If the polyp(s) is proven to be 'pre-cancerous' on                            pathology, you will need repeat colonoscopy in 5                            years. If the polyp(s) is NOT 'precancerous' on                            pathology then you should repeat colon cancer                            screening in 10 years with colonoscopy without need                            for colon cancer screening by any method prior to                            then (including stool testing). Milus Banister, MD 03/12/2016 1:40:25 PM This report has been signed electronically.

## 2016-03-15 ENCOUNTER — Telehealth: Payer: Self-pay | Admitting: *Deleted

## 2016-03-15 NOTE — Telephone Encounter (Signed)
  Follow up Call-  Call back number 03/12/2016 12/23/2015  Post procedure Call Back phone  # [phone]  Permission to leave phone message Yes No  comments - VM not working  Some recent data might be hidden     Patient questions:  Do you have a fever, pain , or abdominal swelling? No. Pain Score  0 *  Have you tolerated food without any problems? Yes.    Have you been able to return to your normal activities? Yes.    Do you have any questions about your discharge instructions: Diet   No. Medications  No. Follow up visit  No.  Do you have questions or concerns about your Care? No.  Actions: * If pain score is 4 or above: No action needed, pain <4.

## 2016-03-22 ENCOUNTER — Encounter: Payer: Self-pay | Admitting: Gastroenterology

## 2016-03-24 ENCOUNTER — Ambulatory Visit (INDEPENDENT_AMBULATORY_CARE_PROVIDER_SITE_OTHER): Payer: Medicare Other | Admitting: *Deleted

## 2016-03-24 VITALS — Ht 63.0 in | Wt 208.0 lb

## 2016-03-24 DIAGNOSIS — D510 Vitamin B12 deficiency anemia due to intrinsic factor deficiency: Secondary | ICD-10-CM | POA: Diagnosis present

## 2016-03-24 MED ORDER — CYANOCOBALAMIN 1000 MCG/ML IJ SOLN
1000.0000 ug | Freq: Once | INTRAMUSCULAR | Status: AC
  Administered 2016-03-24: 1000 ug via INTRAMUSCULAR

## 2016-03-24 NOTE — Progress Notes (Signed)
   Patient in nurse clinic for monthly Vitamin B-12 injection.  Patient is feeling well today, no complaints.  Cyanocobalamin 1000 mcg/m; given right deltoid.  Patient next appt 04/21/16 at 9 AM.  Derl Barrow, RN

## 2016-04-21 ENCOUNTER — Ambulatory Visit (INDEPENDENT_AMBULATORY_CARE_PROVIDER_SITE_OTHER): Payer: Medicare Other | Admitting: *Deleted

## 2016-04-21 DIAGNOSIS — D510 Vitamin B12 deficiency anemia due to intrinsic factor deficiency: Secondary | ICD-10-CM | POA: Diagnosis present

## 2016-04-21 MED ORDER — CYANOCOBALAMIN 1000 MCG/ML IJ SOLN
1000.0000 ug | Freq: Once | INTRAMUSCULAR | Status: AC
  Administered 2016-04-21: 1000 ug via INTRAMUSCULAR

## 2016-04-21 NOTE — Progress Notes (Signed)
   Patient in nurse clinic for monthly Vitamin B-12 injection.  Cyanocobalamin 1000 mcg/ml given right deltoid x 1 IM.  Patient to schedule next injection.  Derl Barrow, RN

## 2016-05-20 ENCOUNTER — Other Ambulatory Visit: Payer: Self-pay | Admitting: Family Medicine

## 2016-05-27 ENCOUNTER — Ambulatory Visit (INDEPENDENT_AMBULATORY_CARE_PROVIDER_SITE_OTHER): Payer: Medicare Other | Admitting: Family Medicine

## 2016-05-27 ENCOUNTER — Encounter: Payer: Self-pay | Admitting: Family Medicine

## 2016-05-27 VITALS — BP 130/78 | Temp 98.4°F | Ht 63.0 in | Wt 207.6 lb

## 2016-05-27 DIAGNOSIS — E785 Hyperlipidemia, unspecified: Secondary | ICD-10-CM

## 2016-05-27 DIAGNOSIS — I10 Essential (primary) hypertension: Secondary | ICD-10-CM | POA: Diagnosis not present

## 2016-05-27 DIAGNOSIS — E669 Obesity, unspecified: Secondary | ICD-10-CM | POA: Diagnosis not present

## 2016-05-27 DIAGNOSIS — D510 Vitamin B12 deficiency anemia due to intrinsic factor deficiency: Secondary | ICD-10-CM

## 2016-05-27 DIAGNOSIS — R739 Hyperglycemia, unspecified: Secondary | ICD-10-CM

## 2016-05-27 DIAGNOSIS — E559 Vitamin D deficiency, unspecified: Secondary | ICD-10-CM | POA: Diagnosis not present

## 2016-05-27 LAB — POCT GLYCOSYLATED HEMOGLOBIN (HGB A1C): Hemoglobin A1C: 5.9

## 2016-05-27 MED ORDER — CYANOCOBALAMIN 1000 MCG/ML IJ SOLN
1000.0000 ug | Freq: Once | INTRAMUSCULAR | Status: AC
  Administered 2016-05-27: 1000 ug via INTRAMUSCULAR

## 2016-05-27 MED ORDER — FLUTICASONE PROPIONATE 50 MCG/ACT NA SUSP: 2.0000 | Freq: Every day | NASAL | 11 refills | Status: DC

## 2016-05-27 NOTE — Assessment & Plan Note (Signed)
Well-controlled Continue current medications Follow-up in 3 months Check CMP today Screening A1c today

## 2016-05-27 NOTE — Assessment & Plan Note (Signed)
Check lipid panel today Continue simvastatin

## 2016-05-27 NOTE — Assessment & Plan Note (Signed)
Recheck vitamin D level 

## 2016-05-27 NOTE — Assessment & Plan Note (Signed)
Advised on diet and exercise. 

## 2016-05-27 NOTE — Assessment & Plan Note (Signed)
Stable and doing well on monthly B-12 injections B-12 injection given today Check CBC, B-12 levels today Discontinue oral B-12 supplement as patient is not absorbing from this route Follow-up in 3 months

## 2016-05-27 NOTE — Progress Notes (Signed)
   Subjective:   Jasmine Gregory is a 66 y.o. female with a history of HTN, GERD, pernicious anemia, atrophic glossitis here for B12 and HTN f/u  Pernicious anemia - EGD 12/23/15 - Mild gastritis and one sessile polyp, biopsies were taken to test for celiac disease - Surgical pathology with benign samples and no evidence of H. pylori infection or celiac disease - Tongue is feeling better and taste is back - Taking oral B12 daily - s/p weekly B12 injections x4 - getting monthly B12 injections - due for one today - energy is much improved, no pallor, no bleeding, no fevers, no rash  HTN: - Medications: lisinopril-HCTZ 40-25mg  daily - Compliance: good - Checking BP at home: no - Denies any SOB, CP, vision changes, LE edema, medication SEs, or symptoms of hypotension - Diet: "up and down", quick meals, light lunch - Exercise: walking in stores for an hour every morning  Vit D Deficiency - takes OTC daily Vit D supplement  HLD - medications: simvastatin 40 mg daily - compliance: good  - medication SEs: none   Review of Systems:  Per HPI.   Social History: never smoker  Objective:  BP 130/78   Temp 98.4 F (36.9 C) (Oral) Comment: couldnt read due to fingernail polish  Ht 5\' 3"  (1.6 m)   Wt 207 lb 9.6 oz (94.2 kg)   BMI 36.77 kg/m   Gen:  66 y.o. female in NAD  HEENT: NCAT, MMM, EOMI, PERRL, anicteric sclerae, tongue normal in appearance CV: RRR, no MRG Resp: Non-labored, CTAB, no wheezes noted Ext: WWP, no edema MSK: Gait intact, no deformities Skin: No rashes Neuro: Alert and oriented, speech normal Psych: Appropriate mood and affect, no evidence of AVH, appropriate groom and dress       Chemistry      Component Value Date/Time   NA 138 12/04/2015 1540   K 4.1 12/04/2015 1540   CL 99 12/04/2015 1540   CO2 33 (H) 12/04/2015 1540   BUN 22 12/04/2015 1540   CREATININE 1.13 (H) 12/04/2015 1540      Component Value Date/Time   CALCIUM 10.1 12/04/2015 1540   ALKPHOS 49 11/28/2015 1849   AST 65 (H) 11/28/2015 1849   ALT 25 11/28/2015 1849   BILITOT 0.8 11/28/2015 1849      Lab Results  Component Value Date   WBC 7.3 02/25/2016   HGB 11.6 (L) 02/25/2016   HCT 36.7 02/25/2016   MCV 90.4 02/25/2016   PLT 274 02/25/2016   Lab Results  Component Value Date   TSH 1.20 04/04/2015   Lab Results  Component Value Date   HGBA1C 5.6 04/04/2015   Assessment & Plan:     NECOLA BLUESTEIN is a 66 y.o. female here for   HYPERTENSION, BENIGN ESSENTIAL Well-controlled Continue current medications Follow-up in 3 months Check CMP today Screening A1c today  Pernicious anemia Stable and doing well on monthly B-12 injections B-12 injection given today Check CBC, B-12 levels today Discontinue oral B-12 supplement as patient is not absorbing from this route Follow-up in 3 months  OBESITY Advised on diet and exercise  Hypovitaminosis D Recheck vitamin D level  HLD (hyperlipidemia) Check lipid panel today Continue simvastatin   [name], MD MPH PGY-3,  [facility] 05/27/2016  9:00 AM

## 2016-05-27 NOTE — Patient Instructions (Addendum)
Nice to see you again today. We will continue with monthly B-12 injections for now. You can stop taking oral B-12. We'll check levels and determine how much vitamin D or B-12 you need from here on.  We are getting some labs and someone will call you or send you a letter with the results when they are available.  Follow-up in 3 months.  Take care, Dr. Jacinto Reap

## 2016-05-28 ENCOUNTER — Telehealth: Payer: Self-pay | Admitting: Family Medicine

## 2016-05-28 LAB — CBC
Hematocrit: 34.5 % (ref 34.0–46.6)
Hemoglobin: 11.1 g/dL (ref 11.1–15.9)
MCH: 27.6 pg (ref 26.6–33.0)
MCHC: 32.2 g/dL (ref 31.5–35.7)
MCV: 86 fL (ref 79–97)
Platelets: 248 10*3/uL (ref 150–379)
RBC: 4.02 x10E6/uL (ref 3.77–5.28)
RDW: 14 % (ref 12.3–15.4)
WBC: 5.5 10*3/uL (ref 3.4–10.8)

## 2016-05-28 LAB — CMP14+EGFR
ALT: 16 IU/L (ref 0–32)
AST: 17 IU/L (ref 0–40)
Albumin/Globulin Ratio: 1.3 (ref 1.2–2.2)
Albumin: 4 g/dL (ref 3.6–4.8)
Alkaline Phosphatase: 75 IU/L (ref 39–117)
BUN/Creatinine Ratio: 17 (ref 12–28)
BUN: 17 mg/dL (ref 8–27)
Bilirubin Total: 0.4 mg/dL (ref 0.0–1.2)
CO2: 28 mmol/L (ref 18–29)
Calcium: 9.7 mg/dL (ref 8.7–10.3)
Chloride: 98 mmol/L (ref 96–106)
Creatinine, Ser: 1.01 mg/dL — ABNORMAL HIGH (ref 0.57–1.00)
GFR calc Af Amer: 67 mL/min/{1.73_m2} (ref >59)
GFR calc non Af Amer: 58 mL/min/{1.73_m2} — ABNORMAL LOW (ref >59)
Globulin, Total: 3.1 g/dL (ref 1.5–4.5)
Glucose: 140 mg/dL — ABNORMAL HIGH (ref 65–99)
Potassium: 3.8 mmol/L (ref 3.5–5.2)
Sodium: 140 mmol/L (ref 134–144)
Total Protein: 7.1 g/dL (ref 6.0–8.5)

## 2016-05-28 LAB — LIPID PANEL
Chol/HDL Ratio: 4.1 ratio (ref 0.0–4.4)
Cholesterol, Total: 185 mg/dL (ref 100–199)
HDL: 45 mg/dL (ref >39)
LDL Calculated: 110 mg/dL — ABNORMAL HIGH (ref 0–99)
Triglycerides: 149 mg/dL (ref 0–149)
VLDL Cholesterol Cal: 30 mg/dL (ref 5–40)

## 2016-05-28 LAB — VITAMIN B12: Vitamin B-12: >2000 pg/mL — ABNORMAL HIGH (ref 232–1245)

## 2016-05-28 LAB — VITAMIN D 25 HYDROXY (VIT D DEFICIENCY, FRACTURES): Vit D, 25-Hydroxy: 23.6 ng/mL — ABNORMAL LOW (ref 30.0–100.0)

## 2016-05-28 MED ORDER — ERGOCALCIFEROL 1.25 MG (50000 UT) PO CAPS: 50000.0000 [IU] | ORAL_CAPSULE | ORAL | 1 refills | Status: DC

## 2016-05-28 NOTE — Telephone Encounter (Signed)
Called patient to discuss lab results.  No answer.  Vit D level remains slightly low but somewhat improved.  Will completely 8 wk course of weekly high dose supplementation and then trasition to daily oral supplementation.  B12 level is quite elevated.  We will decrease injections to q3 months instead of monthly.  A1c remains in prediabetes range.  Continue to watch carbohydrates.  Other labs are stable and Hgb is wnl.  Please try to call patient again and pass this info along to her. Thanks!  [name], MD, MPH PGY-3,  [facility] 05/28/2016 11:52 AM

## 2016-05-31 NOTE — Telephone Encounter (Signed)
Pt informed, and stated understanding. Deseree Kennon Holter, CMA

## 2016-06-25 ENCOUNTER — Ambulatory Visit: Payer: Medicare Other

## 2016-07-15 DIAGNOSIS — M79643 Pain in unspecified hand: Secondary | ICD-10-CM | POA: Diagnosis not present

## 2016-07-15 DIAGNOSIS — M25569 Pain in unspecified knee: Secondary | ICD-10-CM | POA: Diagnosis not present

## 2016-07-15 DIAGNOSIS — L931 Subacute cutaneous lupus erythematosus: Secondary | ICD-10-CM | POA: Diagnosis not present

## 2016-07-15 DIAGNOSIS — M150 Primary generalized (osteo)arthritis: Secondary | ICD-10-CM | POA: Diagnosis not present

## 2016-09-17 ENCOUNTER — Other Ambulatory Visit: Payer: Self-pay | Admitting: *Deleted

## 2016-09-17 MED ORDER — SIMVASTATIN 40 MG PO TABS: 40.0000 mg | ORAL_TABLET | Freq: Every day | ORAL | 3 refills | Status: DC

## 2016-09-17 NOTE — Telephone Encounter (Signed)
Refill request 90 day supply.  Derl Barrow, RN

## 2016-10-06 ENCOUNTER — Encounter: Payer: Self-pay | Admitting: Student in an Organized Health Care Education/Training Program

## 2016-10-06 ENCOUNTER — Other Ambulatory Visit: Payer: Self-pay | Admitting: Student in an Organized Health Care Education/Training Program

## 2016-10-06 ENCOUNTER — Ambulatory Visit (INDEPENDENT_AMBULATORY_CARE_PROVIDER_SITE_OTHER): Payer: Medicare Other | Admitting: Student in an Organized Health Care Education/Training Program

## 2016-10-06 ENCOUNTER — Other Ambulatory Visit: Payer: Self-pay | Admitting: Hematology

## 2016-10-06 VITALS — BP 126/64 | HR 75 | Temp 98.3°F | Ht 63.0 in | Wt 212.6 lb

## 2016-10-06 DIAGNOSIS — M25562 Pain in left knee: Secondary | ICD-10-CM | POA: Diagnosis not present

## 2016-10-06 DIAGNOSIS — M25561 Pain in right knee: Secondary | ICD-10-CM

## 2016-10-06 DIAGNOSIS — E559 Vitamin D deficiency, unspecified: Secondary | ICD-10-CM | POA: Diagnosis not present

## 2016-10-06 DIAGNOSIS — Z0000 Encounter for general adult medical examination without abnormal findings: Secondary | ICD-10-CM | POA: Diagnosis not present

## 2016-10-06 DIAGNOSIS — D510 Vitamin B12 deficiency anemia due to intrinsic factor deficiency: Secondary | ICD-10-CM

## 2016-10-06 DIAGNOSIS — J302 Other seasonal allergic rhinitis: Secondary | ICD-10-CM | POA: Diagnosis not present

## 2016-10-06 DIAGNOSIS — G8929 Other chronic pain: Secondary | ICD-10-CM | POA: Insufficient documentation

## 2016-10-06 DIAGNOSIS — Z780 Asymptomatic menopausal state: Secondary | ICD-10-CM | POA: Diagnosis not present

## 2016-10-06 DIAGNOSIS — E2839 Other primary ovarian failure: Secondary | ICD-10-CM

## 2016-10-06 MED ORDER — ACETAMINOPHEN 500 MG PO TABS: 500.0000 mg | ORAL_TABLET | Freq: Three times a day (TID) | ORAL | 0 refills | Status: AC

## 2016-10-06 MED ORDER — FLUTICASONE PROPIONATE 50 MCG/ACT NA SUSP: 2.0000 | Freq: Every day | NASAL | 11 refills | Status: DC

## 2016-10-06 MED ORDER — CYANOCOBALAMIN 1000 MCG/ML IJ SOLN
1000.0000 ug | Freq: Once | INTRAMUSCULAR | Status: AC
  Administered 2016-10-06: 1000 ug via INTRAMUSCULAR

## 2016-10-06 MED ORDER — CETIRIZINE HCL 10 MG PO TABS: 10.0000 mg | ORAL_TABLET | Freq: Every day | ORAL | 0 refills | Status: AC | PRN

## 2016-10-06 NOTE — Assessment & Plan Note (Addendum)
-   patient to continue OTC vitamin D supplementation - recheck 25-hydroxy vitamin D Due 05/2017

## 2016-10-06 NOTE — Assessment & Plan Note (Addendum)
Patient is exhibiting symptoms of pernicious anemia with low energy and bilateral lower extremity paresthesias that bother her most at night. (This may also be due to peripheral neuropathy). - we did a B12 injection at today's visit - will check B12, CBC today - Patient not taking oral B12 on a daily basis at this time due to B12 being high at her last visit, she states that this was stopped - If B12 is normal, plan to continue every 3 monthly B12 injections without oral supplementation  - If B12 is low today or CBC shows signs of megaloblastic anemia, consider transitioning back to monthly B12 injections, consider B12 oral supplementation

## 2016-10-06 NOTE — Patient Instructions (Addendum)
It was a pleasure seeing you today in our clinic. Today we discussed your B12 deficiency. Here is the treatment plan we have discussed and agreed upon together:  B12 Deficiency - we did a B12 injection at today's visit - We drew blood work at today's visit. I will call or send you a letter with these results. If you do not hear from me within the next week, please give our office a call.  Joint Pain - For your joint pain, I ordered Xrays. I will call you with results when these are completed. - Please take 500 mg of tylenol three times daily. - we ordered Xrays of your knees at today's visit. I will call you with these results. - Please follow up in 6 weeks as needed for continued joint pain  Allergies - I sent zyrtec and flonase to your pharmacy, to be taken daily  Vitamin D - This can be bought over the counter. Please take 600-800 IU Daily.  Our clinic's number is [phone]. Please call with questions or concerns about what we discussed today.  Be well, Dr. Burr Medico
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
## 2016-10-06 NOTE — Assessment & Plan Note (Signed)
Seems to be consistent with osteoarthritis, however unusual that patient would have bilateral symmetric pain. 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 - Treat as osteoarthritis with 500 mg Tylenol 3 times a day, can titrate up as needed - We will order bilateral standing AP/lateral knee x-rays today to rule out alternative causes of bilateral knee pain - Can consider joint injection at next visit if pain continues to be an issue - Encourage weight loss - Follow-up in 6 weeks or sooner as needed

## 2016-10-06 NOTE — Assessment & Plan Note (Addendum)
-   Reviewed environmental triggers, seems to be seasonal allergies. Mild. - zyrtec, flonase sent to pharmacy, to be used daily during change in season

## 2016-10-06 NOTE — Assessment & Plan Note (Addendum)
-   screening Hep C this visit - screening dexa scan ordered and scheduled this visit, patient is postmenopausal and has hx of long term hypovitaminosis D

## 2016-10-07 LAB — CBC
Hematocrit: 33.4 % — ABNORMAL LOW (ref 34.0–46.6)
Hemoglobin: 10.8 g/dL — ABNORMAL LOW (ref 11.1–15.9)
MCH: 27.6 pg (ref 26.6–33.0)
MCHC: 32.3 g/dL (ref 31.5–35.7)
MCV: 85 fL (ref 79–97)
Platelets: 272 10*3/uL (ref 150–379)
RBC: 3.91 x10E6/uL (ref 3.77–5.28)
RDW: 14.1 % (ref 12.3–15.4)
WBC: 6 10*3/uL (ref 3.4–10.8)

## 2016-10-07 LAB — HEPATITIS C ANTIBODY: Hep C Virus Ab: <0.1 s/co ratio (ref 0.0–0.9)

## 2016-10-07 LAB — VITAMIN B12: Vitamin B-12: >2000 pg/mL — ABNORMAL HIGH (ref 232–1245)

## 2016-10-22 ENCOUNTER — Ambulatory Visit
Admission: RE | Admit: 2016-10-22 | Discharge: 2016-10-22 | Disposition: A | Discharge status: Home / Self Care | Payer: Medicare Other | Source: Ambulatory Visit | Attending: Family Medicine | Admitting: Family Medicine

## 2016-10-22 ENCOUNTER — Encounter: Payer: Self-pay | Admitting: Student in an Organized Health Care Education/Training Program

## 2016-10-22 DIAGNOSIS — Z13820 Encounter for screening for osteoporosis: Secondary | ICD-10-CM | POA: Diagnosis not present

## 2016-10-22 DIAGNOSIS — Z780 Asymptomatic menopausal state: Secondary | ICD-10-CM | POA: Diagnosis not present

## 2016-10-22 DIAGNOSIS — E2839 Other primary ovarian failure: Secondary | ICD-10-CM

## 2016-10-22 NOTE — Progress Notes (Signed)
Called to inform patient that the results of her bone density scan were normal. No answer. Will send letter.

## 2016-11-05 ENCOUNTER — Emergency Department (HOSPITAL_COMMUNITY)
Admission: EM | Admit: 2016-11-05 | Discharge: 2016-11-05 | Disposition: A | Discharge status: Home / Self Care | Payer: No Typology Code available for payment source | Attending: Emergency Medicine | Admitting: Emergency Medicine

## 2016-11-05 ENCOUNTER — Emergency Department (HOSPITAL_COMMUNITY): Payer: No Typology Code available for payment source

## 2016-11-05 DIAGNOSIS — S20219A Contusion of unspecified front wall of thorax, initial encounter: Secondary | ICD-10-CM | POA: Insufficient documentation

## 2016-11-05 DIAGNOSIS — Y92410 Unspecified street and highway as the place of occurrence of the external cause: Secondary | ICD-10-CM | POA: Insufficient documentation

## 2016-11-05 DIAGNOSIS — Y998 Other external cause status: Secondary | ICD-10-CM | POA: Insufficient documentation

## 2016-11-05 DIAGNOSIS — Z79899 Other long term (current) drug therapy: Secondary | ICD-10-CM | POA: Diagnosis not present

## 2016-11-05 DIAGNOSIS — I10 Essential (primary) hypertension: Secondary | ICD-10-CM | POA: Diagnosis not present

## 2016-11-05 DIAGNOSIS — R0789 Other chest pain: Secondary | ICD-10-CM

## 2016-11-05 DIAGNOSIS — R0781 Pleurodynia: Secondary | ICD-10-CM | POA: Diagnosis not present

## 2016-11-05 DIAGNOSIS — Y9389 Activity, other specified: Secondary | ICD-10-CM | POA: Insufficient documentation

## 2016-11-05 DIAGNOSIS — S299XXA Unspecified injury of thorax, initial encounter: Secondary | ICD-10-CM | POA: Diagnosis not present

## 2016-11-05 MED ORDER — IBUPROFEN 400 MG PO TABS
400.0000 mg | ORAL_TABLET | Freq: Once | ORAL | Status: AC | PRN
  Administered 2016-11-05: 400 mg via ORAL

## 2016-11-05 MED ORDER — IBUPROFEN 400 MG PO TABS
ORAL_TABLET | ORAL | Status: AC
  Administered 2016-11-05: 400 mg via ORAL
  Filled 2016-11-05: qty 1

## 2016-11-05 MED ORDER — CYCLOBENZAPRINE HCL 10 MG PO TABS: 10.0000 mg | ORAL_TABLET | Freq: Two times a day (BID) | ORAL | 0 refills | Status: DC | PRN

## 2016-11-05 NOTE — ED Notes (Signed)
ED Provider at bedside. 

## 2016-11-05 NOTE — ED Notes (Signed)
Patient transported to X-ray 
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
## 2016-11-05 NOTE — ED Notes (Signed)
Pt departed in NAD.  
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
## 2016-11-08 ENCOUNTER — Ambulatory Visit (INDEPENDENT_AMBULATORY_CARE_PROVIDER_SITE_OTHER): Payer: Medicare Other | Admitting: Internal Medicine

## 2016-11-08 ENCOUNTER — Encounter: Payer: Self-pay | Admitting: Internal Medicine

## 2016-11-08 DIAGNOSIS — Z23 Encounter for immunization: Secondary | ICD-10-CM | POA: Diagnosis present
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
## 2017-01-13 ENCOUNTER — Other Ambulatory Visit: Payer: Self-pay | Admitting: Family Medicine

## 2017-01-13 NOTE — Telephone Encounter (Signed)
Will forward to new PCP  Joscelin Fray, Dionne Bucy, MD, MPH [facility] 01/13/2017 2:40 PM

## 2017-01-14 DIAGNOSIS — M25569 Pain in unspecified knee: Secondary | ICD-10-CM | POA: Diagnosis not present

## 2017-01-14 DIAGNOSIS — M150 Primary generalized (osteo)arthritis: Secondary | ICD-10-CM | POA: Diagnosis not present

## 2017-01-14 DIAGNOSIS — N390 Urinary tract infection, site not specified: Secondary | ICD-10-CM | POA: Diagnosis not present

## 2017-01-14 DIAGNOSIS — L931 Subacute cutaneous lupus erythematosus: Secondary | ICD-10-CM | POA: Diagnosis not present

## 2017-01-14 DIAGNOSIS — M79645 Pain in left finger(s): Secondary | ICD-10-CM | POA: Diagnosis not present

## 2017-01-14 DIAGNOSIS — M79643 Pain in unspecified hand: Secondary | ICD-10-CM | POA: Diagnosis not present

## 2017-04-13 ENCOUNTER — Encounter: Payer: Self-pay | Admitting: Student in an Organized Health Care Education/Training Program

## 2017-04-13 ENCOUNTER — Other Ambulatory Visit: Payer: Self-pay

## 2017-04-13 ENCOUNTER — Ambulatory Visit (INDEPENDENT_AMBULATORY_CARE_PROVIDER_SITE_OTHER): Payer: Medicare Other | Admitting: Student in an Organized Health Care Education/Training Program

## 2017-04-13 DIAGNOSIS — D510 Vitamin B12 deficiency anemia due to intrinsic factor deficiency: Secondary | ICD-10-CM | POA: Diagnosis not present

## 2017-04-13 DIAGNOSIS — I10 Essential (primary) hypertension: Secondary | ICD-10-CM | POA: Diagnosis present

## 2017-04-13 DIAGNOSIS — J309 Allergic rhinitis, unspecified: Secondary | ICD-10-CM

## 2017-04-13 MED ORDER — LISINOPRIL-HYDROCHLOROTHIAZIDE 20-12.5 MG PO TABS: 2.0000 | ORAL_TABLET | Freq: Every day | ORAL | 3 refills | Status: DC
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
## 2017-04-13 NOTE — Patient Instructions (Signed)
It was a pleasure seeing you today in our clinic. Here is the treatment plan we have discussed and agreed upon together:  We drew blood work at today's visit. I will call or send you a letter with these results. If you do not hear from me within the next week, please give our office a call.  Our clinic's number is [phone]. Please call with questions or concerns about what we discussed today.  Be well, Dr. Jajuan Skoog   

## 2017-04-13 NOTE — Assessment & Plan Note (Signed)
Only symptom of anemia is requiring naps.  No red flag symptoms.  No red flags on exam.  Is no longer taking oral supplementation or IV supplementation of B12 -Recheck CBC, B12 and folate today -If B12 is low/she is anemic, can consider oral supplementation or IV supplementation depending on severity -Will call patient follow-up with results

## 2017-04-13 NOTE — Assessment & Plan Note (Signed)
His congestion does not seem to be infectious at this time unless it is caused by a virus.  She does have chronic congestion.  Recommend using Flonase to help drain the sinuses.  Additionally use Mucinex to help thin out secretions.   Follow-up if symptoms worsen or fail to improve

## 2017-04-14 ENCOUNTER — Other Ambulatory Visit: Payer: Self-pay | Admitting: Student in an Organized Health Care Education/Training Program

## 2017-04-14 ENCOUNTER — Telehealth: Payer: Self-pay | Admitting: Student in an Organized Health Care Education/Training Program

## 2017-04-14 DIAGNOSIS — D510 Vitamin B12 deficiency anemia due to intrinsic factor deficiency: Secondary | ICD-10-CM

## 2017-04-14 LAB — CBC
Hematocrit: 35.5 % (ref 34.0–46.6)
Hemoglobin: 10.9 g/dL — ABNORMAL LOW (ref 11.1–15.9)
MCH: 26.7 pg (ref 26.6–33.0)
MCHC: 30.7 g/dL — ABNORMAL LOW (ref 31.5–35.7)
MCV: 87 fL (ref 79–97)
Platelets: 297 10*3/uL (ref 150–379)
RBC: 4.08 x10E6/uL (ref 3.77–5.28)
RDW: 14.1 % (ref 12.3–15.4)
WBC: 6 10*3/uL (ref 3.4–10.8)

## 2017-04-14 LAB — B12 AND FOLATE PANEL
Folate: 10 ng/mL (ref >3.0)
Vitamin B-12: 164 pg/mL — ABNORMAL LOW (ref 232–1245)

## 2017-04-14 MED ORDER — CYANOCOBALAMIN 1000 MCG/ML IJ SOLN: 1000.0000 ug | Freq: Once | INTRAMUSCULAR | Status: DC

## 2017-04-14 NOTE — Telephone Encounter (Signed)
I attempted to call patient but was unable to get through.  Her vitamin B12 level has dropped since her last appointment. Her hemoglobin is stable. She needs another B12 injection.   - Can you please have her schedule an office visit with me so we can do the B12 injection and discuss plan for how to manage her B12 deficiency going forward.  - If she is not able to get in for an office visit with me within the next week, lets have her come in for a nurse visit to get the B12 injection and then an office visit when possible to discuss plan going forward. I will place future B12 injection order just in case.  Thank you.  Everrett Coombe, MD PGY-2 [facility]

## 2017-04-14 NOTE — Telephone Encounter (Signed)
Attempted to call again.  No answer and no machine.  Will try again tomorrow.  Fleeger, [name], CMA

## 2017-04-15 ENCOUNTER — Encounter: Payer: Self-pay | Admitting: Student in an Organized Health Care Education/Training Program

## 2017-04-15 NOTE — Telephone Encounter (Signed)
Yes I will send a letter. Thank you for trying to get a hold of her.

## 2017-04-15 NOTE — Telephone Encounter (Signed)
Attempted to call again, same response.   Dr. Burr Medico,  Would you like to mail a letter, although not ideal? Kesia Dalto, [name], [place]

## 2017-04-21 ENCOUNTER — Telehealth: Payer: Self-pay

## 2017-04-21 NOTE — Telephone Encounter (Signed)
Would like to speak to MD regarding lab work. Her call back [phone] Wallace Cullens, RN

## 2017-04-22 ENCOUNTER — Ambulatory Visit (INDEPENDENT_AMBULATORY_CARE_PROVIDER_SITE_OTHER): Payer: Medicare Other

## 2017-04-22 DIAGNOSIS — D510 Vitamin B12 deficiency anemia due to intrinsic factor deficiency: Secondary | ICD-10-CM | POA: Diagnosis present

## 2017-04-22 MED ORDER — CYANOCOBALAMIN 1000 MCG/ML IJ SOLN
1000.0000 ug | Freq: Once | INTRAMUSCULAR | Status: AC
  Administered 2017-04-22: 1000 ug via INTRAMUSCULAR

## 2017-04-22 NOTE — Telephone Encounter (Signed)
Spoke with patient and answered her questions. She will schedule an office visit so we can manage her B12/make a plan for management going forward.

## 2017-04-22 NOTE — Progress Notes (Signed)
Pt in nurse clinic for b12 injection. Cyanocobalamin 1076mcg given L deltoid. Pt tolerated well. Pt would like to speak with MD about her treatment plan, and if it is ok to continue her b12 tablets with her injection. Will forward note to MD. Wallace Cullens, RN

## 2017-04-27 DIAGNOSIS — M79645 Pain in left finger(s): Secondary | ICD-10-CM | POA: Diagnosis not present

## 2017-04-27 DIAGNOSIS — D638 Anemia in other chronic diseases classified elsewhere: Secondary | ICD-10-CM | POA: Diagnosis not present

## 2017-04-27 DIAGNOSIS — N390 Urinary tract infection, site not specified: Secondary | ICD-10-CM | POA: Diagnosis not present

## 2017-04-27 DIAGNOSIS — M150 Primary generalized (osteo)arthritis: Secondary | ICD-10-CM | POA: Diagnosis not present

## 2017-04-27 DIAGNOSIS — R21 Rash and other nonspecific skin eruption: Secondary | ICD-10-CM | POA: Diagnosis not present

## 2017-04-27 DIAGNOSIS — Z79899 Other long term (current) drug therapy: Secondary | ICD-10-CM | POA: Diagnosis not present

## 2017-04-27 DIAGNOSIS — M25569 Pain in unspecified knee: Secondary | ICD-10-CM | POA: Diagnosis not present

## 2017-04-27 DIAGNOSIS — L931 Subacute cutaneous lupus erythematosus: Secondary | ICD-10-CM | POA: Diagnosis not present

## 2017-04-27 DIAGNOSIS — M79643 Pain in unspecified hand: Secondary | ICD-10-CM | POA: Diagnosis not present

## 2017-07-15 DIAGNOSIS — M79643 Pain in unspecified hand: Secondary | ICD-10-CM | POA: Diagnosis not present

## 2017-07-15 DIAGNOSIS — M79645 Pain in left finger(s): Secondary | ICD-10-CM | POA: Diagnosis not present

## 2017-07-15 DIAGNOSIS — D649 Anemia, unspecified: Secondary | ICD-10-CM | POA: Diagnosis not present

## 2017-07-15 DIAGNOSIS — H04129 Dry eye syndrome of unspecified lacrimal gland: Secondary | ICD-10-CM | POA: Diagnosis not present

## 2017-07-15 DIAGNOSIS — M25511 Pain in right shoulder: Secondary | ICD-10-CM | POA: Diagnosis not present

## 2017-07-15 DIAGNOSIS — L932 Other local lupus erythematosus: Secondary | ICD-10-CM | POA: Diagnosis not present

## 2017-07-15 DIAGNOSIS — M25569 Pain in unspecified knee: Secondary | ICD-10-CM | POA: Diagnosis not present

## 2017-07-15 DIAGNOSIS — M150 Primary generalized (osteo)arthritis: Secondary | ICD-10-CM | POA: Diagnosis not present

## 2017-07-18 DIAGNOSIS — L931 Subacute cutaneous lupus erythematosus: Secondary | ICD-10-CM | POA: Diagnosis not present

## 2017-07-18 DIAGNOSIS — M150 Primary generalized (osteo)arthritis: Secondary | ICD-10-CM | POA: Diagnosis not present

## 2017-08-04 ENCOUNTER — Other Ambulatory Visit: Payer: Self-pay

## 2017-08-04 ENCOUNTER — Encounter: Payer: Self-pay | Admitting: Student in an Organized Health Care Education/Training Program

## 2017-08-04 ENCOUNTER — Ambulatory Visit (INDEPENDENT_AMBULATORY_CARE_PROVIDER_SITE_OTHER): Payer: Medicare Other | Admitting: Student in an Organized Health Care Education/Training Program

## 2017-08-04 VITALS — BP 138/64 | HR 81 | Temp 98.4°F | Ht 63.0 in | Wt 218.6 lb

## 2017-08-04 DIAGNOSIS — Z1231 Encounter for screening mammogram for malignant neoplasm of breast: Secondary | ICD-10-CM

## 2017-08-04 DIAGNOSIS — J309 Allergic rhinitis, unspecified: Secondary | ICD-10-CM | POA: Diagnosis not present

## 2017-08-04 DIAGNOSIS — D510 Vitamin B12 deficiency anemia due to intrinsic factor deficiency: Secondary | ICD-10-CM | POA: Diagnosis not present

## 2017-08-04 DIAGNOSIS — E538 Deficiency of other specified B group vitamins: Secondary | ICD-10-CM

## 2017-08-04 DIAGNOSIS — Z23 Encounter for immunization: Secondary | ICD-10-CM

## 2017-08-04 DIAGNOSIS — Z1239 Encounter for other screening for malignant neoplasm of breast: Secondary | ICD-10-CM

## 2017-08-04 MED ORDER — CYANOCOBALAMIN 1000 MCG/ML IJ SOLN
1000.0000 ug | Freq: Once | INTRAMUSCULAR | Status: AC
  Administered 2017-08-04: 1000 ug via INTRAMUSCULAR

## 2017-08-04 NOTE — Assessment & Plan Note (Signed)
Patient is asymptomatic from anemia Test B12 today B12 injection today Release of information from other provider to obtain CBC which was drawn earlier this week  Based on results from B12 lab test today, will decide whether to give next injection at 1 month, 3 months, or 6 months.

## 2017-08-04 NOTE — Assessment & Plan Note (Addendum)
No dyspnea red flag symptoms.  Most consistent with seasonal allergies. Continue Zyrtec.  Offered to send Allegra or Claritin, however patient would like to continue Zyrtec at this time.  Encourage Flonase use daily  Follow-up as needed if symptoms worsen or fail to improve
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
## 2017-08-05 LAB — VITAMIN B12: Vitamin B-12: >2000 pg/mL — ABNORMAL HIGH (ref 232–1245)

## 2017-08-12 ENCOUNTER — Telehealth: Payer: Self-pay | Admitting: *Deleted

## 2017-08-12 NOTE — Telephone Encounter (Signed)
-----   Message from Everrett Coombe, MD sent at 08/10/2017 11:35 AM EDT ----- Please inform patient that her B12 level was above normal. We will recheck the B12 level in 3 months and decide whether to proceed with another injection at that time.

## 2017-08-12 NOTE — Telephone Encounter (Signed)
Pt informed. Zimmerman Rumple, Fradel Baldonado D, CMA  

## 2017-08-18 ENCOUNTER — Encounter: Payer: Self-pay | Admitting: Student in an Organized Health Care Education/Training Program

## 2017-09-01 ENCOUNTER — Ambulatory Visit
Admission: RE | Admit: 2017-09-01 | Discharge: 2017-09-01 | Disposition: A | Discharge status: Home / Self Care | Payer: Medicare Other | Source: Ambulatory Visit | Attending: Family Medicine | Admitting: Family Medicine

## 2017-09-01 DIAGNOSIS — Z1231 Encounter for screening mammogram for malignant neoplasm of breast: Secondary | ICD-10-CM | POA: Diagnosis not present

## 2017-09-01 DIAGNOSIS — Z1239 Encounter for other screening for malignant neoplasm of breast: Secondary | ICD-10-CM

## 2017-10-11 ENCOUNTER — Other Ambulatory Visit: Payer: Self-pay | Admitting: Student in an Organized Health Care Education/Training Program

## 2017-12-28 DIAGNOSIS — H04129 Dry eye syndrome of unspecified lacrimal gland: Secondary | ICD-10-CM | POA: Diagnosis not present

## 2017-12-28 DIAGNOSIS — M25511 Pain in right shoulder: Secondary | ICD-10-CM | POA: Diagnosis not present

## 2017-12-28 DIAGNOSIS — L932 Other local lupus erythematosus: Secondary | ICD-10-CM | POA: Diagnosis not present

## 2017-12-28 DIAGNOSIS — M150 Primary generalized (osteo)arthritis: Secondary | ICD-10-CM | POA: Diagnosis not present

## 2017-12-28 DIAGNOSIS — M79643 Pain in unspecified hand: Secondary | ICD-10-CM | POA: Diagnosis not present

## 2017-12-28 DIAGNOSIS — M79642 Pain in left hand: Secondary | ICD-10-CM | POA: Diagnosis not present

## 2017-12-28 DIAGNOSIS — D649 Anemia, unspecified: Secondary | ICD-10-CM | POA: Diagnosis not present

## 2017-12-28 DIAGNOSIS — M19041 Primary osteoarthritis, right hand: Secondary | ICD-10-CM | POA: Diagnosis not present

## 2017-12-28 DIAGNOSIS — M79641 Pain in right hand: Secondary | ICD-10-CM | POA: Diagnosis not present

## 2017-12-28 DIAGNOSIS — M79645 Pain in left finger(s): Secondary | ICD-10-CM | POA: Diagnosis not present

## 2017-12-28 DIAGNOSIS — M25569 Pain in unspecified knee: Secondary | ICD-10-CM | POA: Diagnosis not present

## 2017-12-28 DIAGNOSIS — Z79899 Other long term (current) drug therapy: Secondary | ICD-10-CM | POA: Diagnosis not present

## 2017-12-28 DIAGNOSIS — M19011 Primary osteoarthritis, right shoulder: Secondary | ICD-10-CM | POA: Diagnosis not present

## 2017-12-28 DIAGNOSIS — M19042 Primary osteoarthritis, left hand: Secondary | ICD-10-CM | POA: Diagnosis not present

## 2017-12-28 DIAGNOSIS — M7989 Other specified soft tissue disorders: Secondary | ICD-10-CM | POA: Diagnosis not present

## 2018-02-13 ENCOUNTER — Encounter: Payer: Self-pay | Admitting: Student in an Organized Health Care Education/Training Program

## 2018-02-13 ENCOUNTER — Ambulatory Visit (INDEPENDENT_AMBULATORY_CARE_PROVIDER_SITE_OTHER): Payer: Medicare Other | Admitting: Student in an Organized Health Care Education/Training Program

## 2018-02-13 ENCOUNTER — Other Ambulatory Visit: Payer: Self-pay

## 2018-02-13 VITALS — BP 130/70 | HR 93 | Temp 98.8°F | Wt 217.1 lb

## 2018-02-13 DIAGNOSIS — D510 Vitamin B12 deficiency anemia due to intrinsic factor deficiency: Secondary | ICD-10-CM

## 2018-02-13 DIAGNOSIS — R7309 Other abnormal glucose: Secondary | ICD-10-CM

## 2018-02-13 DIAGNOSIS — Z23 Encounter for immunization: Secondary | ICD-10-CM

## 2018-02-13 DIAGNOSIS — M25511 Pain in right shoulder: Secondary | ICD-10-CM | POA: Diagnosis not present

## 2018-02-13 LAB — POCT GLYCOSYLATED HEMOGLOBIN (HGB A1C): HbA1c, POC (prediabetic range): 6.3 % (ref 5.7–6.4)

## 2018-02-13 NOTE — Patient Instructions (Signed)
It was a pleasure seeing you today in our clinic.  I provided the dietitian's card for you to call and schedule an appointment.  Please schedule follow up in 1 month.  Our clinic's number is [phone]. Please call with questions or concerns about what we discussed today.  Be well, Dr. Burr Medico

## 2018-02-13 NOTE — Progress Notes (Signed)
   CC: Right shoulder pain and stiffness and follow up B12  HPI: PATINA SPANIER is a 68 y.o. female with PMH:  Patient Active Problem List   Diagnosis Date Noted  . Pain in joint, shoulder region 02/16/2018  . Chronic pain of both knees 10/06/2016  . Hot flashes 04/04/2015  . Polyarthropathy 12/20/2011  . Cutaneous lupus erythematosus 11/11/2011  . Hypovitaminosis D 07/20/2011  . Benign paroxysmal positional vertigo 01/29/2011  . Peripheral neuropathy 06/12/2010  . Allergic rhinitis 10/27/2009  . Pernicious anemia 05/20/2009  . OBESITY 05/26/2006  . Anxiety and depression 05/26/2006  . HYPERTENSION, BENIGN ESSENTIAL 05/26/2006  . HLD (hyperlipidemia) 03/17/2006  . GASTROESOPHAGEAL REFLUX, NO ESOPHAGITIS 03/17/2006    1. Pernicious anemia - patient endorses mild fatigue but no palpitations or dyspnea. No dizziness.  She follows up for B12 checks and injections but it has been 6 months since her last check. Previously her B12 was elevated at 2000 and she was recommended to follow up in 3 months.  2. Shoulder pain - worsening over past several months, now with shoulder stiffness. She has been seen by her rheumatologist for this problem and X-rays were ordered which are not available in our EMR. She does have SLE. She denies inciting injury but has noticed increasing stiffness in the shoulder.   Review of Symptoms:  See HPI for ROS.   CC, SH/smoking status, and VS noted.  Objective: BP 130/70   Pulse 93   Temp 98.8 F (37.1 C) (Oral)   Wt 217 lb 2 oz (98.5 kg)   SpO2 99%   BMI 38.46 kg/m  GEN: NAD, alert, cooperative, and pleasant. RESPIRATORY: Comfortable work of breathing, speaks in full sentences CV: RRR, no m/r/g, distal extremities well perfused and warm without edema GI: Soft, nondistended SKIN: warm and dry, no rashes or lesions NEURO: II-XII grossly intact MSK: Moves 4 extremities equally PSYCH: AAOx3, appropriate affect  Shoulder, right: +ttp anteriorly. No  evidence of bony deformity, asymmetry or muscle atrophy. +mild tenderness over long head of the biceps in the bicipital groove. No ttp at [facility] joint. Limited active ROM when lifting the arm over her head. Internal and external rotation are normal. Strength 5/5 throughout. Sensation intact. Peripheral pulses intact.  Special Tests:   - Crossarm test: NEG   - Empty can: NEG   - Hawkins: NEG   Assessment and plan:  Pernicious anemia Symptoms of fatigue, no palpitations or breathlessness. No murmer.  - check CBC for hgb - check B12 - will plan to have patient come back for a nurse visit B12 injection once we have B12 level - follow up in 3 months or sooner if becomes symptomatic  Pain in joint, shoulder region Likely MSK in origin. Patient has polyarthropathy and lupus. Unable to visualize films ordered by rheum. There is some stiffness of the joint which is likely to worsen if patient does not start moving the joint. - ibuprofen PRN - joint mobilization exercises (ie crawl the hand up the wall) - heating pad PRN - offered steroid injection, patient would like to hold off - advised caution with potential for frozen shoulder, she vocalizes understanding   Orders Placed This Encounter  Procedures  . Flu Vaccine QUAD 36+ mos IM  . CBC  . Vitamin B12  . HgB A1c   Everrett Coombe, MD,MS,  PGY3 02/16/2018 9:09 AM

## 2018-02-14 LAB — VITAMIN B12: Vitamin B-12: 127 pg/mL — ABNORMAL LOW (ref 232–1245)

## 2018-02-14 LAB — CBC
Hematocrit: 34.4 % (ref 34.0–46.6)
Hemoglobin: 10.8 g/dL — ABNORMAL LOW (ref 11.1–15.9)
MCH: 26.5 pg — ABNORMAL LOW (ref 26.6–33.0)
MCHC: 31.4 g/dL — ABNORMAL LOW (ref 31.5–35.7)
MCV: 85 fL (ref 79–97)
Platelets: 183 10*3/uL (ref 150–450)
RBC: 4.07 x10E6/uL (ref 3.77–5.28)
RDW: 13.3 % (ref 11.7–15.4)
WBC: 6.9 10*3/uL (ref 3.4–10.8)

## 2018-02-16 DIAGNOSIS — M25519 Pain in unspecified shoulder: Secondary | ICD-10-CM | POA: Insufficient documentation

## 2018-02-16 NOTE — Assessment & Plan Note (Signed)
Likely MSK in origin. Patient has polyarthropathy and lupus. Unable to visualize films ordered by rheum. There is some stiffness of the joint which is likely to worsen if patient does not start moving the joint. - ibuprofen PRN - joint mobilization exercises (ie crawl the hand up the wall) - heating pad PRN - offered steroid injection, patient would like to hold off - advised caution with potential for frozen shoulder, she vocalizes understanding

## 2018-02-16 NOTE — Assessment & Plan Note (Signed)
Symptoms of fatigue, no palpitations or breathlessness. No murmer.  - check CBC for hgb - check B12 - will plan to have patient come back for a nurse visit B12 injection once we have B12 level - follow up in 3 months or sooner if becomes symptomatic

## 2018-02-20 ENCOUNTER — Ambulatory Visit: Payer: Medicare Other

## 2018-02-20 ENCOUNTER — Telehealth: Payer: Self-pay | Admitting: *Deleted

## 2018-02-20 ENCOUNTER — Other Ambulatory Visit: Payer: Self-pay | Admitting: Student in an Organized Health Care Education/Training Program

## 2018-02-20 DIAGNOSIS — D510 Vitamin B12 deficiency anemia due to intrinsic factor deficiency: Secondary | ICD-10-CM

## 2018-02-20 MED ORDER — CYANOCOBALAMIN 1000 MCG/ML IJ SOLN: 1000.0000 ug | Freq: Once | INTRAMUSCULAR | Status: DC

## 2018-02-20 NOTE — Telephone Encounter (Signed)
Contacted pt and gave her the below information and she will be in this afternoon to have her injection. Jasmine Gregory, Jasmine Gregory D, [place]

## 2018-02-20 NOTE — Progress Notes (Signed)
B12 injection ordered. Patient should come in for nurse visit for injection and then follow up in 3 months for repeat blood test.

## 2018-02-20 NOTE — Progress Notes (Signed)
Pt presents in nurse clinic for B12 injection per orders from pcp. Injection given in the LD, site unremarkable. Pt reminded of visit with pcp on 2/25 and informed we will redraw B12 labs in 3 months.

## 2018-02-20 NOTE — Telephone Encounter (Signed)
-----   Message from Everrett Coombe, MD sent at 02/20/2018  9:26 AM EST ----- [facility] team,  Please call and schedule a nurse visit for patient to come in for B12 injection. She should then follow up in 3 months for next blood check.  Thank you

## 2018-02-21 ENCOUNTER — Ambulatory Visit (INDEPENDENT_AMBULATORY_CARE_PROVIDER_SITE_OTHER): Payer: Medicare Other | Admitting: Student in an Organized Health Care Education/Training Program

## 2018-02-21 ENCOUNTER — Other Ambulatory Visit: Payer: Self-pay

## 2018-02-21 VITALS — BP 162/80 | HR 110 | Temp 99.3°F | Ht 63.0 in | Wt 216.4 lb

## 2018-02-21 DIAGNOSIS — H8110 Benign paroxysmal vertigo, unspecified ear: Secondary | ICD-10-CM | POA: Diagnosis not present

## 2018-02-21 DIAGNOSIS — H6122 Impacted cerumen, left ear: Secondary | ICD-10-CM

## 2018-02-21 MED ORDER — GABAPENTIN 600 MG PO TABS: 600.0000 mg | ORAL_TABLET | Freq: Every day | ORAL | 0 refills | Status: DC | PRN

## 2018-02-21 MED ORDER — PAROXETINE HCL 40 MG PO TABS: 40.0000 mg | ORAL_TABLET | Freq: Every day | ORAL | 0 refills | Status: DC

## 2018-02-21 MED ORDER — MECLIZINE HCL 12.5 MG PO TABS: 12.5000 mg | ORAL_TABLET | Freq: Three times a day (TID) | ORAL | 0 refills | Status: DC | PRN

## 2018-02-21 NOTE — Patient Instructions (Signed)
It was a pleasure seeing you today in our clinic. Here is the treatment plan we have discussed and agreed upon together:  A consult was placed to Vestibular Rehab (physical therapy) at today's visit.  You will receive a call to schedule an appointment. If you do not receive a call within two weeks please call our office so we can place the consult again.  Our clinic's number is [phone]. Please call with questions or concerns about what we discussed today.  Be well, Dr. Burr Medico

## 2018-02-21 NOTE — Progress Notes (Signed)
   CC: dizziness "the room is spinning"  HPI: Jasmine Gregory is a 68 y.o. female with PMH: Past Medical History:  Diagnosis Date  . Anemia   . Anxiety   . Arthritis   . Depression   . Diverticulosis   . Environmental and seasonal allergies   . GERD (gastroesophageal reflux disease)   . Hemorrhoids   . History of blood transfusion   . Hyperlipidemia   . Hypertension   . Hypovitaminosis D 07/20/2011   Vitamin D was 17 in May 2012. Patient has not taken any Vitamin D supplement since that time.     . Murmur, heart   . Obesity   . Vitamin B 12 deficiency 07/20/2011   Vitamin B12 was low at 169 in May 2012. No evidence of supplementation since that time.      She reports when she woke up this morning she felt that the room was spinning.  She cannot hear out of her left ear.  This episode lasted for 2 hours and was associated with nausea but no emesis.  She reports that this dizziness resolved on its own, however recurred multiple times throughout the day.  The spinning is worse when she turns her head quickly.  She has never had prior episodes in the past.  She is not currently dizzy right now.  She denies chest pain, dyspnea. No N/V/D/C. No fevers.    Review of Symptoms:  See HPI for ROS.   CC, SH/smoking status, and VS noted.  Objective: BP (!) 162/80   Pulse (!) 110   Temp 99.3 F (37.4 C) (Oral)   Ht 5\' 3"  (1.6 m)   Wt 216 lb 6.4 oz (98.2 kg)   SpO2 99%   BMI 38.33 kg/m  GEN: NAD, alert, cooperative, and pleasant. EYE: no conjunctival injection, pupils equally round and reactive to light ENMT: Ears cerumen-impacted bilaterally.  RESPIRATORY: clear to auscultation bilaterally with no wheezes, rhonchi or rales, good effort CV: RRR, no m/r/g GI: soft, non-tender, non-distended, no hepatosplenomegaly SKIN: warm and dry, no rashes or lesions NEURO: II-XII grossly intact, no nystagmus appreciated PSYCH: AAOx3, appropriate affect  Assessment and plan:  1. Benign  paroxysmal positional vertigo, unspecified laterality she is not currently dizzy in the office, however she was dizzy multiple times throughout the day today. She has not been vomiting and has been able to stay hydrated. Orthostatic vitals in the office are negative today. - PT vestibular rehab; Future - meclizine (ANTIVERT) 12.5 MG tablet; Take 1 tablet (12.5 mg total) by mouth 3 (three) times daily as needed for dizziness.  Dispense: 20 tablet; Refill: 0 -Use meclizine judiciously 68 year old patient as this medication is on the Beers list - follow up precautions advised  2. Impacted cerumen of left ear He is extremely anxious to have her ears cleaned out today.  We attempted at flushing out her ears.  She was recommended to use Debrox over-the-counter to help soften up cerumen.  If hearing does not improve with disimpaction of cerumen, she will likely require ENT referral for audiology.  This was discussed.  3. Elevated BP -patient reports that her blood pressure readings at home are within normal limits.  She reports that her blood pressure is always high when she comes to the doctor's office because she gets nervous here.  Everrett Coombe, MD,MS,  PGY3 02/21/2018 3:53 PM

## 2018-02-21 NOTE — Addendum Note (Signed)
Addended by: Boris Sharper on: 02/21/2018 04:32 PM   Modules accepted: Orders

## 2018-03-06 ENCOUNTER — Telehealth: Payer: Self-pay

## 2018-03-06 NOTE — Telephone Encounter (Signed)
Pt called nurse line stating she was recently referred to ENT by PCP and she has not heard from anyone. Per chart review, I did not see where a referral was placed, only for gastro. Please advise.

## 2018-03-14 ENCOUNTER — Ambulatory Visit (INDEPENDENT_AMBULATORY_CARE_PROVIDER_SITE_OTHER): Payer: Medicare Other | Admitting: Student in an Organized Health Care Education/Training Program

## 2018-03-14 ENCOUNTER — Encounter: Payer: Self-pay | Admitting: Student in an Organized Health Care Education/Training Program

## 2018-03-14 ENCOUNTER — Other Ambulatory Visit: Payer: Self-pay

## 2018-03-14 VITALS — BP 136/74 | HR 82 | Temp 98.8°F | Ht 63.0 in | Wt 219.2 lb

## 2018-03-14 DIAGNOSIS — H8110 Benign paroxysmal vertigo, unspecified ear: Secondary | ICD-10-CM

## 2018-03-14 DIAGNOSIS — D510 Vitamin B12 deficiency anemia due to intrinsic factor deficiency: Secondary | ICD-10-CM | POA: Diagnosis not present

## 2018-03-14 MED ORDER — MECLIZINE HCL 12.5 MG PO TABS: 12.5000 mg | ORAL_TABLET | Freq: Three times a day (TID) | ORAL | 0 refills | Status: DC | PRN

## 2018-03-14 NOTE — Patient Instructions (Signed)
It was a pleasure seeing you today in our clinic. Today we discussed dizziness and B12 deficiency. Here is the treatment plan we have discussed and agreed upon together:  We drew blood work at today's visit. I will call or send you a letter with these results. If you do not hear from me within the next week, please give our office a call.  A consult was placed to vestibular rehab at today's visit.  You will receive a call to schedule an appointment. If you do not receive a call within two weeks please call our office so we can place the consult again.  Our clinic's number is [phone]. Please call with questions or concerns about what we discussed today.  Be well, Dr. Burr Medico

## 2018-03-14 NOTE — Assessment & Plan Note (Signed)
Refill meclizine x30 tablets.  Patient is instructed to take only as needed.  Informed her of this medication being on the beers list that we use with caution in her age group.  She voices understanding. -Ambulatory referral to vestibular rehab external referral, most of them placed incorrectly at the last visit -To call if she has difficulty getting in with vestibular rehab

## 2018-03-14 NOTE — Assessment & Plan Note (Signed)
Last B12 injection was on 02/20/2018.  Will recheck B12 today.  She is asymptomatic.  Anticipate every 3 month injections, however based on B12 results today we may need to adjust this interval.

## 2018-03-14 NOTE — Progress Notes (Signed)
CC: follow up dizziness  HPI: Jasmine Gregory is a 68 y.o. female  With PMH: Patient Active Problem List   Diagnosis Date Noted  . Pain in joint, shoulder region 02/16/2018  . Chronic pain of both knees 10/06/2016  . Hot flashes 04/04/2015  . Polyarthropathy 12/20/2011  . Cutaneous lupus erythematosus 11/11/2011  . Hypovitaminosis D 07/20/2011  . Benign paroxysmal positional vertigo 01/29/2011  . Peripheral neuropathy 06/12/2010  . Allergic rhinitis 10/27/2009  . Pernicious anemia 05/20/2009  . OBESITY 05/26/2006  . Anxiety and depression 05/26/2006  . HYPERTENSION, BENIGN ESSENTIAL 05/26/2006  . HLD (hyperlipidemia) 03/17/2006  . GASTROESOPHAGEAL REFLUX, NO ESOPHAGITIS 03/17/2006   BPPV  Patient was last seen in the office on 2/4 for BPPV.  She was prescribed meclizine and intended to be referred to vestibular rehab.  However the referral did not go through appropriately and she has been waiting for a call.  She reports her symptoms improved with meclizine.  She still has some dizziness if she turns her head too quickly.  She reports that she has been careful to turn to the side slowly to avoid vertigo.  She denies any nausea.  She is not having any symptoms right now.  She initially took meclizine 3 times daily and weaned down to once daily as needed.  She has not been taking it for the last few days and has no pills left.  She asks if she can have a refill of this medication in case her symptoms relapse.  Pernicious anemia patient undergoes B12 injections.  Last injection was on 2/3.  She is denying palpitations, shortness of breath, lightheadedness.  Review of Symptoms:  See HPI for ROS.   CC, SH/smoking status, and VS noted.  Objective: BP 136/74   Pulse 82   Temp 98.8 F (37.1 C) (Oral)   Ht 5\' 3"  (1.6 m)   Wt 219 lb 3.2 oz (99.4 kg)   SpO2 98%   BMI 38.83 kg/m  GEN: NAD, alert, cooperative, and pleasant. HEENT: mild end-gaze nystagmus bilaterally RESPIRATORY:  Comfortable work of breathing, speaks in full sentences CV: Regular rate noted, distal extremities well perfused and warm without edema GI: Soft, nondistended SKIN: warm and dry, no rashes or lesions NEURO: II-XII grossly intact MSK: Moves 4 extremities equally PSYCH: AAOx3, appropriate affect  Assessment and plan:  Benign paroxysmal positional vertigo Refill meclizine x30 tablets.  Patient is instructed to take only as needed.  Informed her of this medication being on the beers list that we use with caution in her age group.  She voices understanding. -Ambulatory referral to vestibular rehab external referral, most of them placed incorrectly at the last visit -To call if she has difficulty getting in with vestibular rehab  Pernicious anemia Last B12 injection was on 02/20/2018.  Will recheck B12 today.  She is asymptomatic.  Anticipate every 3 month injections, however based on B12 results today we may need to adjust this interval.   Orders Placed This Encounter  Procedures  . Vitamin B12  . Ambulatory referral to Physical Therapy    Referral Priority:   Routine    Referral Type:   Physical Medicine    Referral Reason:   Specialty Services Required    Requested Specialty:   Physical Therapy    Number of Visits Requested:   1    Meds ordered this encounter  Medications  . meclizine (ANTIVERT) 12.5 MG tablet    Sig: Take 1 tablet (12.5 mg total) by  mouth 3 (three) times daily as needed for dizziness.    Dispense:  30 tablet    Refill:  0     Everrett Coombe, MD,MS,  PGY3 03/14/2018 8:48 AM

## 2018-03-15 LAB — VITAMIN B12: Vitamin B-12: 176 pg/mL — ABNORMAL LOW (ref 232–1245)

## 2018-03-16 ENCOUNTER — Other Ambulatory Visit: Payer: Self-pay | Admitting: Student in an Organized Health Care Education/Training Program

## 2018-03-16 DIAGNOSIS — D510 Vitamin B12 deficiency anemia due to intrinsic factor deficiency: Secondary | ICD-10-CM

## 2018-03-16 MED ORDER — CYANOCOBALAMIN 1000 MCG/ML IJ SOLN: 1000.0000 ug | Freq: Once | INTRAMUSCULAR | Status: DC

## 2018-03-20 ENCOUNTER — Ambulatory Visit (INDEPENDENT_AMBULATORY_CARE_PROVIDER_SITE_OTHER): Payer: Medicare Other

## 2018-03-20 DIAGNOSIS — D510 Vitamin B12 deficiency anemia due to intrinsic factor deficiency: Secondary | ICD-10-CM

## 2018-03-20 MED ORDER — CYANOCOBALAMIN 1000 MCG/ML IJ SOLN
1000.0000 ug | Freq: Once | INTRAMUSCULAR | Status: AC
  Administered 2018-03-20: 1000 ug via INTRAMUSCULAR

## 2018-03-20 NOTE — Progress Notes (Signed)
   Patient presented to nurse visit for Vitamin B-12 injection ordered by PCP. Injection given right deltoid. Patient tolerated well. Patient will call back for a lab appointment in one month to check level.  Danley Danker, RN [facility] RN)

## 2018-04-12 ENCOUNTER — Other Ambulatory Visit: Payer: Self-pay | Admitting: Student in an Organized Health Care Education/Training Program

## 2018-04-12 DIAGNOSIS — I10 Essential (primary) hypertension: Secondary | ICD-10-CM

## 2018-04-14 ENCOUNTER — Other Ambulatory Visit: Payer: Self-pay | Admitting: Family Medicine

## 2018-04-14 DIAGNOSIS — I10 Essential (primary) hypertension: Secondary | ICD-10-CM

## 2018-04-17 ENCOUNTER — Other Ambulatory Visit: Payer: Self-pay | Admitting: *Deleted

## 2018-04-17 DIAGNOSIS — I10 Essential (primary) hypertension: Secondary | ICD-10-CM

## 2018-04-19 ENCOUNTER — Other Ambulatory Visit: Payer: Medicare Other

## 2018-04-19 ENCOUNTER — Other Ambulatory Visit: Payer: Self-pay

## 2018-04-19 DIAGNOSIS — D510 Vitamin B12 deficiency anemia due to intrinsic factor deficiency: Secondary | ICD-10-CM | POA: Diagnosis not present

## 2018-04-19 NOTE — Progress Notes (Signed)
b12

## 2018-04-19 NOTE — Telephone Encounter (Signed)
Patient has not called in prescription for BP medication yet.  Please let her know how to proceed if she needs to do anything different. (she is almost out)

## 2018-04-20 LAB — CBC
Hematocrit: 32.7 % — ABNORMAL LOW (ref 34.0–46.6)
Hemoglobin: 10.6 g/dL — ABNORMAL LOW (ref 11.1–15.9)
MCH: 27.7 pg (ref 26.6–33.0)
MCHC: 32.4 g/dL (ref 31.5–35.7)
MCV: 86 fL (ref 79–97)
Platelets: 157 10*3/uL (ref 150–450)
RBC: 3.82 x10E6/uL (ref 3.77–5.28)
RDW: 13.4 % (ref 11.7–15.4)
WBC: 6.2 10*3/uL (ref 3.4–10.8)

## 2018-04-20 LAB — VITAMIN B12: Vitamin B-12: 163 pg/mL — ABNORMAL LOW (ref 232–1245)

## 2018-04-20 MED ORDER — LISINOPRIL-HYDROCHLOROTHIAZIDE 20-12.5 MG PO TABS: 2.0000 | ORAL_TABLET | Freq: Every day | ORAL | 0 refills | Status: DC

## 2018-04-24 ENCOUNTER — Other Ambulatory Visit: Payer: Self-pay | Admitting: Student in an Organized Health Care Education/Training Program

## 2018-04-24 ENCOUNTER — Telehealth: Payer: Self-pay | Admitting: *Deleted

## 2018-04-24 DIAGNOSIS — D510 Vitamin B12 deficiency anemia due to intrinsic factor deficiency: Secondary | ICD-10-CM

## 2018-04-24 MED ORDER — CYANOCOBALAMIN 1000 MCG/ML IJ SOLN
1000.0000 ug | Freq: Once | INTRAMUSCULAR | Status: AC
  Administered 2018-04-25: 1000 ug via INTRAMUSCULAR

## 2018-04-24 NOTE — Telephone Encounter (Signed)
-----   Message from Everrett Coombe, MD sent at 04/24/2018  6:47 AM EDT ----- B12 is low. Will place orders for another B12 injection.  White team please call and have patient come in for nurse visit to have the B12  injection.  - We will recheck B12 levels in one month, so she also needs a lab visit for mid-May

## 2018-04-24 NOTE — Telephone Encounter (Signed)
Pt informed and schedule for both appt. Deseree Kennon Holter, CMA

## 2018-04-25 ENCOUNTER — Other Ambulatory Visit: Payer: Self-pay

## 2018-04-25 ENCOUNTER — Ambulatory Visit (INDEPENDENT_AMBULATORY_CARE_PROVIDER_SITE_OTHER): Payer: Medicare Other | Admitting: *Deleted

## 2018-04-25 DIAGNOSIS — D510 Vitamin B12 deficiency anemia due to intrinsic factor deficiency: Secondary | ICD-10-CM

## 2018-04-25 NOTE — Progress Notes (Signed)
Pt is here for a b12 injection today.    Date of last office visit that b12 was discussed 04/25/18  Last injection was 03/20/18  Injection given in right deltoid, pt tolerated well and lab visit scheduled for next month Christen Bame, CMA

## 2018-05-23 ENCOUNTER — Other Ambulatory Visit: Payer: Medicare Other

## 2018-05-23 ENCOUNTER — Other Ambulatory Visit: Payer: Self-pay

## 2018-05-23 DIAGNOSIS — D510 Vitamin B12 deficiency anemia due to intrinsic factor deficiency: Secondary | ICD-10-CM | POA: Diagnosis not present

## 2018-05-24 ENCOUNTER — Other Ambulatory Visit: Payer: Medicare Other

## 2018-05-24 LAB — VITAMIN B12: Vitamin B-12: 256 pg/mL (ref 232–1245)

## 2018-05-24 LAB — CBC
Hematocrit: 32 % — ABNORMAL LOW (ref 34.0–46.6)
Hemoglobin: 10.3 g/dL — ABNORMAL LOW (ref 11.1–15.9)
MCH: 27.5 pg (ref 26.6–33.0)
MCHC: 32.2 g/dL (ref 31.5–35.7)
MCV: 86 fL (ref 79–97)
Platelets: 174 10*3/uL (ref 150–450)
RBC: 3.74 x10E6/uL — ABNORMAL LOW (ref 3.77–5.28)
RDW: 13.2 % (ref 11.7–15.4)
WBC: 6.3 10*3/uL (ref 3.4–10.8)

## 2018-06-19 ENCOUNTER — Telehealth: Payer: Self-pay | Admitting: *Deleted

## 2018-06-19 NOTE — Telephone Encounter (Signed)
Pt wants to know since her B12 labs are ok, should she continue to take the b12 injections?  Christen Bame, CMA

## 2018-06-21 NOTE — Telephone Encounter (Signed)
We should recheck her B12 in one month to make sure it remains normal. She will likely continue to need injections in the future.

## 2018-06-21 NOTE — Telephone Encounter (Signed)
Spoke to pt. She will call and schedule an appt in July. Ottis Stain, CMA

## 2018-07-19 ENCOUNTER — Ambulatory Visit (INDEPENDENT_AMBULATORY_CARE_PROVIDER_SITE_OTHER): Payer: Medicare Other | Admitting: Student in an Organized Health Care Education/Training Program

## 2018-07-19 ENCOUNTER — Other Ambulatory Visit: Payer: Self-pay

## 2018-07-19 ENCOUNTER — Encounter: Payer: Self-pay | Admitting: Student in an Organized Health Care Education/Training Program

## 2018-07-19 VITALS — BP 152/74 | HR 105

## 2018-07-19 DIAGNOSIS — D510 Vitamin B12 deficiency anemia due to intrinsic factor deficiency: Secondary | ICD-10-CM

## 2018-07-19 NOTE — Patient Instructions (Signed)
It was a pleasure seeing you today in our clinic.  We drew blood work at today's visit. I will call or send you a letter with these results. If you do not hear from me within the next week, please give our office a call.  Our clinic's number is [phone]. Please call with questions or concerns about what we discussed today.  Be well, Dr. Diasha Castleman   

## 2018-07-19 NOTE — Progress Notes (Signed)
   CC: B12 follow up  HPI: Jasmine Gregory is a 68 y.o. female with PMH significant for BPPV, HLD, anxiety and depression, hx of pernicious anemia on B12 injections who presents to [facility] today for follow up B12 deficiency.  Patient has hx of pernicious anemia. She has received B12 injections in the past. Last two injections were 04/25/2018, 03/20/2018. She presents for follow up today. She usually is aware when she is becoming anemic. She has not felt fatigue, palpitations, dyspnea. She has not had tongue changes or jaundice. She denies pain in her legs, though she has taken gabapentin in the past month for neuropathic pain.  Review of Symptoms:  See HPI for ROS.   CC, SH/smoking status, and VS noted.  Objective: BP (!) 152/74   Pulse (!) 105   SpO2 98%  GEN: NAD, alert, cooperative, and pleasant. EYE: no conjunctival injection, pupils equally round and reactive to light ENMT: MMM, no glossitis NECK: full ROM RESPIRATORY: clear to auscultation bilaterally with no wheezes, rhonchi or rales, good effort CV: RRR, no m/r/g, no peripheral edema GI: soft, non-tender, non-distended, no hepatosplenomegaly SKIN: warm and dry, no rashes or lesions, no jaundice NEURO: II-XII grossly intact, normal gait, peripheral sensation intact PSYCH: AAOx3, appropriate affect  Assessment and plan:  Pernicious anemia - CBC, B12 today - late entry: Hgb is at baseline, B12 is low - plan for next B12 injection as lab visit - pt to follow up in one month for monitoring and next B12 injection  Patient slightly tachycardic and hypertensive. She endorses white coat hypertension, does not like coming to the doctor. Plan to follow up BP at next OV.  Orders Placed This Encounter  Procedures  . Vitamin B12  . CBC    Everrett Coombe, MD,MS,  PGY3 07/20/2018 9:30 AM

## 2018-07-20 LAB — CBC
Hematocrit: 32.4 % — ABNORMAL LOW (ref 34.0–46.6)
Hemoglobin: 10.6 g/dL — ABNORMAL LOW (ref 11.1–15.9)
MCH: 27.3 pg (ref 26.6–33.0)
MCHC: 32.7 g/dL (ref 31.5–35.7)
MCV: 84 fL (ref 79–97)
Platelets: 203 10*3/uL (ref 150–450)
RBC: 3.88 x10E6/uL (ref 3.77–5.28)
RDW: 13.1 % (ref 11.7–15.4)
WBC: 6.5 10*3/uL (ref 3.4–10.8)

## 2018-07-20 LAB — VITAMIN B12: Vitamin B-12: 148 pg/mL — ABNORMAL LOW (ref 232–1245)

## 2018-07-20 MED ORDER — CYANOCOBALAMIN 1000 MCG/ML IJ SOLN
1000.0000 ug | Freq: Once | INTRAMUSCULAR | Status: AC
  Administered 2018-08-31: 1000 ug via INTRAMUSCULAR

## 2018-07-20 NOTE — Assessment & Plan Note (Addendum)
-   CBC, B12 today - late entry: Hgb is at baseline, B12 is low - plan for next B12 injection as lab visit - pt to follow up in one month for monitoring and next B12 injection

## 2018-07-24 ENCOUNTER — Other Ambulatory Visit: Payer: Self-pay | Admitting: Student in an Organized Health Care Education/Training Program

## 2018-07-24 DIAGNOSIS — I10 Essential (primary) hypertension: Secondary | ICD-10-CM

## 2018-07-24 NOTE — Telephone Encounter (Signed)
Please refill Mrs. Dorminey Zestoretic 20-12.5mg  2 PO tablets daily for 90 days (180 tablets)

## 2018-07-25 ENCOUNTER — Encounter: Payer: Self-pay | Admitting: Rehabilitative and Restorative Service Providers"

## 2018-07-25 ENCOUNTER — Other Ambulatory Visit: Payer: Self-pay

## 2018-07-25 ENCOUNTER — Ambulatory Visit: Payer: Medicare Other | Attending: Family Medicine | Admitting: Rehabilitative and Restorative Service Providers"

## 2018-07-25 VITALS — BP 142/96

## 2018-07-25 DIAGNOSIS — R42 Dizziness and giddiness: Secondary | ICD-10-CM | POA: Diagnosis not present

## 2018-07-25 DIAGNOSIS — R2689 Other abnormalities of gait and mobility: Secondary | ICD-10-CM | POA: Diagnosis not present

## 2018-07-25 NOTE — Patient Instructions (Signed)
Access Code: [email or web address]  URL: [email or web address]  Date: 07/25/2018  Prepared by: Rudell Cobb   Exercises Seated Gaze Stabilization with Head Rotation - 10 reps - 3 sets - 3x daily - 7x weekly Brandt-Daroff Vestibular Exercise - 5 reps - 2x daily - 7x weekly Supine Cervical Rotation AROM on Pillow - 5-10 reps - 1 sets - 2x daily - 7x weekly

## 2018-07-25 NOTE — Therapy (Signed)
[facility] [address] [place], [name], [address] Phone: [phone]   Fax:  [phone]  Physical Therapy Evaluation  Patient Details  Name: Jasmine Gregory MRN: [id number] Date of Birth: 09-10-1950 Referring Provider (PT): Dorris Singh, MD  CLINIC OPERATION CHANGES: Outpatient Neuro Rehab is open at lower capacity following universal masking, social distancing, and patient screening.  The patient's COVID risk of complications score is 3.  Encounter Date: 07/25/2018  PT End of Session - 07/25/18 0857    Visit Number  1    Number of Visits  6    Date for PT Re-Evaluation  09/08/18    Authorization Type  Medicare- 10th visit progress note    PT Start Time  0803    PT Stop Time  0845    PT Time Calculation (min)  42 min    Activity Tolerance  Patient tolerated treatment well    Behavior During Therapy  [facility] for tasks assessed/performed       Past Medical History:  Diagnosis Date  . Anemia   . Anxiety   . Arthritis   . Depression   . Diverticulosis   . Environmental and seasonal allergies   . GERD (gastroesophageal reflux disease)   . Hemorrhoids   . History of blood transfusion   . Hyperlipidemia   . Hypertension   . Hypovitaminosis D 07/20/2011   Vitamin D was 17 in May 2012. Patient has not taken any Vitamin D supplement since that time.     . Murmur, heart   . Obesity   . Vitamin B 12 deficiency 07/20/2011   Vitamin B12 was low at 169 in May 2012. No evidence of supplementation since that time.     Past Surgical History:  Procedure Laterality Date  . NO PAST SURGERIES      Vitals:   07/25/18 0850  BP: (!) 142/96     Subjective Assessment - 07/25/18 0803    Subjective  The patient woke up in early 2020 and "everything in the room was going around."  She also had unsteadiness.  This initial onset lasted all day long.  She reports her right ear hearing was less and she got it cleaned out at the  doctor's office. She reports at onset when she sat still, she didn't get dizzy, but any head movement provoked it.  At this time, she notes dizziness when moving her head (bending or looking up make it worse).    Pertinent History  HTN, diabetes, other history reviewed in epic    Patient Stated Goals  "learn something to alleviate the symptoms."    Currently in Pain?  No/denies         [facility] PT Assessment - 07/25/18 0806      Assessment   Medical Diagnosis  BPPV    Referring Provider (PT)  Dorris Singh, MD    Onset Date/Surgical Date  --   early 2020; unsure of specific date   Prior Therapy  none      Precautions   Precautions  None      Restrictions   Weight Bearing Restrictions  No      Balance Screen   Has the patient fallen in the past 6 months  No    Has the patient had a decrease in activity level because of a fear of falling?   Yes   due to covid   Is the patient reluctant to leave their home because of a fear  of falling?   Yes   due to covid     [place] Access  Level entry    [place]  None      Prior Function   Level of [place]  Retired           Vestibular Assessment - 07/25/18 0808      Vestibular Assessment   General Observation  Patient walks into clinic independently without a device.      Symptom Behavior   Subjective history of current problem  Initial onset of vertigo in early 2020/ no prior history    Type of Dizziness   Spinning    Frequency of Dizziness  regularly    Duration of Dizziness  minutes    Symptom Nature  Motion provoked    Aggravating Factors  Turning head quickly;Forward bending    Relieving Factors  Head stationary    Progression of Symptoms  Better      Oculomotor Exam   Oculomotor Alignment  Normal    Ocular ROM  WNLs    Spontaneous  Absent     Gaze-induced   Absent    Smooth Pursuits  Intact    Saccades  Intact      Vestibulo-Ocular Reflex   VOR 1 Head Only (x 1 viewing)  slow gaze x 1 adaptation x 5 reps provokes 3/10;     Comment  Head impulse test=positive bilaterally for refixation saccade (patient closes her eyes and notes discomfort); rates subjective dizziness 3/10.      Positional Testing   Dix-Hallpike  Dix-Hallpike Right;Dix-Hallpike Left    Sidelying Test  Sidelying Right;Sidelying Left    Horizontal Canal Testing  Horizontal Canal Right;Horizontal Canal Left      Dix-Hallpike Right   Dix-Hallpike Right Duration  Sensation of lightheadedeness and mild dizziness, no nystagmus viewed in room light    Dix-Hallpike Right Symptoms  No nystagmus      Dix-Hallpike Left   Dix-Hallpike Left Duration  none    Dix-Hallpike Left Symptoms  No nystagmus      Sidelying Right   Sidelying Right Duration  mild sensation of dizziness    Sidelying Right Symptoms  No nystagmus      Sidelying Left   Sidelying Left Duration  none    Sidelying Left Symptoms  No nystagmus      Horizontal Canal Right   Horizontal Canal Right Duration  none    Horizontal Canal Right Symptoms  Normal      Horizontal Canal Left   Horizontal Canal Left Duration  none    Horizontal Canal Left Symptoms  Normal          Objective measurements completed on examination: See above findings.       Vestibular Treatment/Exercise - 07/25/18 9024      Vestibular Treatment/Exercise   Vestibular Treatment Provided  Habituation;Gaze    Habituation Exercises  Laruth Bouchard Daroff;Horizontal Roll;Comment   Also performed supine horizontal head turns   Gaze Exercises  X1 Viewing Horizontal;X1 Viewing Vertical      Nestor Lewandowsky   Number of Reps   2    Symptom Description   Mild sensation of dizziness.       Horizontal Roll   Number of Reps   2  Symptom Description   Patient c/o 7/10 symptoms with horizontal rolling.  Modified to supine with head  turns x 5 reps with dizziness 3/10.      X1 Viewing Horizontal   Foot Position  seated    Comments  x 10 reps x 2 sets with dizziness 3/10.       X1 Viewing Vertical   Foot Position  seated    Comments  Does not provoke symptoms when performed x 10 reps.             PT Education - 07/25/18 0849    Education Details  HEP for gaze x 1, habituation brandt daroff, habituation horizontal head turns supine    Person(s) Educated  Patient    Methods  Explanation;Demonstration;Handout    Comprehension  Verbalized understanding;Returned demonstration       PT Short Term Goals - 07/25/18 0849      PT SHORT TERM GOAL #1   Title  The patient will return demo HEP for vestibular adaptation, habituation.    Time  3    Period  Weeks    Target Date  08/15/18        PT Long Term Goals - 07/25/18 0854      PT LONG TERM GOAL #1   Title  The patient will be independent with progression of HEP.    Time  6    Period  Weeks    Target Date  09/08/18      PT LONG TERM GOAL #2   Title  The patient will have no subjective reports of dizziness with R dix hallpike to demo reduced motion sensitivity.    Time  6    Period  Weeks    Target Date  09/08/18      PT LONG TERM GOAL #3   Title  The patient will perform all bed mobility without c/o dizziness.    Time  6    Period  Weeks    Target Date  09/08/18      PT LONG TERM GOAL #4   Title  The patient will tolerate gaze adaptation x 1 viewing x 60 seconds without increased dizziness.    Time  6    Period  Weeks    Target Date  09/08/18             Plan - 07/25/18 0857    Clinical Impression Statement  The patient is a 68 yo female presenting to OP physical therapy with 6-7 month history of room spinning vertigo.  She presents with decreased use of VOR per refixation saccade with head impulse testing, motion sensitivity with head motion, and positional sensitivity withR dix hallpike, although no nystagmus viewed in room light  today.  PT to address deficits to promote improved mobility and return to driving.    Personal Factors and Comorbidities  Comorbidity 1;Comorbidity 2;Comorbidity 3+    Comorbidities  HTN, diabetes, peripheral neuropathy    Examination-Activity Limitations  Bend;Bed Mobility;Reach Overhead    Examination-Participation Restrictions  Community Activity;Driving    Stability/Clinical Decision Making  Stable/Uncomplicated    Clinical Decision Making  Low    Rehab Potential  Good    PT Frequency  1x / week    PT Duration  6 weeks    PT Treatment/Interventions  ADLs/Self Care Home Management;Neuromuscular re-education;Gait training;Functional mobility training;Therapeutic activities;Therapeutic exercise;Balance training;Patient/family education;Canalith Repostioning;Vestibular    PT Next Visit Plan  Recheck positional testing, progress gaze x 1 viewing, check habituation HEP  and progress to tolerance.    PT Home Exercise Plan  [phone]    Consulted and Agree with Plan of Care  Patient       Patient will benefit from skilled therapeutic intervention in order to improve the following deficits and impairments:  Abnormal gait, Decreased balance, Dizziness  Visit Diagnosis: 1. Dizziness and giddiness   2. Other abnormalities of gait and mobility        Problem List Patient Active Problem List   Diagnosis Date Noted  . Pain in joint, shoulder region 02/16/2018  . Chronic pain of both knees 10/06/2016  . Hot flashes 04/04/2015  . Polyarthropathy 12/20/2011  . Cutaneous lupus erythematosus 11/11/2011  . Hypovitaminosis D 07/20/2011  . Benign paroxysmal positional vertigo 01/29/2011  . Peripheral neuropathy 06/12/2010  . Allergic rhinitis 10/27/2009  . Pernicious anemia 05/20/2009  . OBESITY 05/26/2006  . Anxiety and depression 05/26/2006  . HYPERTENSION, BENIGN ESSENTIAL 05/26/2006  . HLD (hyperlipidemia) 03/17/2006  . GASTROESOPHAGEAL REFLUX, NO ESOPHAGITIS 03/17/2006     Fletcher Rathbun, PT 07/25/2018, 11:36 AM  [facility] [address] [place], [name], [address] Phone: [phone]   Fax:  [phone]  Name: Jasmine Gregory MRN: [id number] Date of Birth: 1950-08-21

## 2018-08-01 DIAGNOSIS — M1990 Unspecified osteoarthritis, unspecified site: Secondary | ICD-10-CM | POA: Diagnosis not present

## 2018-08-01 DIAGNOSIS — R319 Hematuria, unspecified: Secondary | ICD-10-CM | POA: Diagnosis not present

## 2018-08-01 DIAGNOSIS — D649 Anemia, unspecified: Secondary | ICD-10-CM | POA: Diagnosis not present

## 2018-08-01 DIAGNOSIS — M25511 Pain in right shoulder: Secondary | ICD-10-CM | POA: Diagnosis not present

## 2018-08-01 DIAGNOSIS — L931 Subacute cutaneous lupus erythematosus: Secondary | ICD-10-CM | POA: Diagnosis not present

## 2018-08-01 DIAGNOSIS — M7989 Other specified soft tissue disorders: Secondary | ICD-10-CM | POA: Diagnosis not present

## 2018-08-01 DIAGNOSIS — M79643 Pain in unspecified hand: Secondary | ICD-10-CM | POA: Diagnosis not present

## 2018-08-01 DIAGNOSIS — M25569 Pain in unspecified knee: Secondary | ICD-10-CM | POA: Diagnosis not present

## 2018-08-01 DIAGNOSIS — L932 Other local lupus erythematosus: Secondary | ICD-10-CM | POA: Diagnosis not present

## 2018-08-01 DIAGNOSIS — M79645 Pain in left finger(s): Secondary | ICD-10-CM | POA: Diagnosis not present

## 2018-08-01 DIAGNOSIS — M150 Primary generalized (osteo)arthritis: Secondary | ICD-10-CM | POA: Diagnosis not present

## 2018-08-01 DIAGNOSIS — N390 Urinary tract infection, site not specified: Secondary | ICD-10-CM | POA: Diagnosis not present

## 2018-08-01 DIAGNOSIS — H04129 Dry eye syndrome of unspecified lacrimal gland: Secondary | ICD-10-CM | POA: Diagnosis not present

## 2018-08-17 ENCOUNTER — Other Ambulatory Visit: Payer: Self-pay

## 2018-08-17 ENCOUNTER — Encounter: Payer: Self-pay | Admitting: Physical Therapy

## 2018-08-17 ENCOUNTER — Ambulatory Visit: Payer: Medicare Other | Admitting: Physical Therapy

## 2018-08-17 DIAGNOSIS — R2689 Other abnormalities of gait and mobility: Secondary | ICD-10-CM | POA: Diagnosis not present

## 2018-08-17 DIAGNOSIS — R42 Dizziness and giddiness: Secondary | ICD-10-CM

## 2018-08-17 NOTE — Patient Instructions (Signed)
For Home Exercises 08/17/18  -For head turning lying in the bed: continue to use two pillows, focus on how tense your body feels - turn palms up, perform a few deep breaths and let your body relax before turning your head  -For head turning and lying down to your side - keep using two pillows and focus on breathing when going down to your side and back up  -For the letter on the wall exercise: keep your eyes focused on the target.  Perform half head turns side to side for 30 seconds.  Perform half head nods up and down 12 repetitions.  Gradually work up to 30 seconds.

## 2018-08-17 NOTE — Therapy (Signed)
[facility] [address] [place], [name], [address] Phone: [phone]   Fax:  [phone]  Physical Therapy Treatment  Patient Details  Name: Jasmine Gregory MRN: [id number] Date of Birth: Jun 17, 1950 Referring Provider (PT): Dorris Singh, MD   Encounter Date: 08/17/2018   CLINIC OPERATION CHANGES: [facility] is open at lower capacity following universal masking, social distancing, and patient screening.  The patient's COVID risk of complications score is 3.   PT End of Session - 08/17/18 0759    Visit Number  2    Number of Visits  6    Date for PT Re-Evaluation  09/08/18    Authorization Type  Medicare- 10th visit progress note    PT Start Time  0706    PT Stop Time  0746    PT Time Calculation (min)  40 min    Activity Tolerance  Patient tolerated treatment well    Behavior During Therapy  [facility] for tasks assessed/performed       Past Medical History:  Diagnosis Date  . Anemia   . Anxiety   . Arthritis   . Depression   . Diverticulosis   . Environmental and seasonal allergies   . GERD (gastroesophageal reflux disease)   . Hemorrhoids   . History of blood transfusion   . Hyperlipidemia   . Hypertension   . Hypovitaminosis D 07/20/2011   Vitamin D was 17 in May 2012. Patient has not taken any Vitamin D supplement since that time.     . Murmur, heart   . Obesity   . Vitamin B 12 deficiency 07/20/2011   Vitamin B12 was low at 169 in May 2012. No evidence of supplementation since that time.     Past Surgical History:  Procedure Laterality Date  . NO PAST SURGERIES      There were no vitals filed for this visit.  Subjective Assessment - 08/17/18 0707    Subjective  Pt reports dizziness is improving.  Reports doing the exercises but has a little bit of difficulty with brandt-daroff.    Pertinent History  HTN, diabetes, other history reviewed in epic    Patient Stated Goals  "learn something  to alleviate the symptoms."    Currently in Pain?  No/denies             Vestibular Assessment - 08/17/18 0709      Vestibulo-Ocular Reflex   VOR Cancellation  Comment    Comment  Cancellation - difficulty maintaining focus on thumb due to feeling "woozy"      Visual Acuity   Static  6    Dynamic  3   3 line difference, mm guarding when reading first line,dizzy     Positional Testing   Dix-Hallpike  Dix-Hallpike Right;Dix-Hallpike Left    Horizontal Canal Testing  Horizontal Canal Right;Horizontal Canal Left      Dix-Hallpike Right   Dix-Hallpike Right Duration  0    Dix-Hallpike Right Symptoms  No nystagmus      Dix-Hallpike Left   Dix-Hallpike Left Duration  feelings of "wooziness" in this position today    Dix-Hallpike Left Symptoms  Right nystagmus      Horizontal Canal Right   Horizontal Canal Right Duration  none    Horizontal Canal Right Symptoms  Normal      Horizontal Canal Left   Horizontal Canal Left Duration  none    Horizontal Canal Left Symptoms  Normal  Vestibular Treatment/Exercise - 08/17/18 0726      Vestibular Treatment/Exercise   Vestibular Treatment Provided  Habituation;Gaze    Gaze Exercises  X1 Viewing Horizontal;X1 Viewing Vertical      Nestor Lewandowsky   Number of Reps   2    Symptom Description   no dizziness when performing, mild dizziness when sitting back upright at end.  Provided cues for breathing during activity and to continue to use 2 pillows       Horizontal Roll   Number of Reps   5    Symptom Description   still performing head turns with 2 pillows.  Pt noted to be gripping side of mat and holding breath.  Discusssed mm guarding and increased tension as brain perceives a threat - practiced mm relaxation and breathing prior to turning the head with reduction in symptoms.  Will go down to 1 pillow once pt is able to perform with decreased guarding      X1 Viewing Horizontal   Foot Position  seated     Comments  10 reps with verbal cues to decrease head ROM to keep gaze stable on target.  Progressed to 30 seconds      X1 Viewing Vertical   Foot Position  seated    Reps  10    Comments  x 2 reps with verbal cues to keep eyes focused on target.  Mild symptoms, not able to progress to 30 seconds            PT Education - 08/17/18 0756    Education Details  continued education regarding vestibular hypofunction, over reliance on visual information and visual motion sensitivity.  Reviewed HEP and incorporated breathing and grounding to decrease mm tension and fear of movement.  Progressed x1 viewing    Person(s) Educated  Patient    Methods  Explanation;Demonstration;Handout    Comprehension  Verbalized understanding;Returned demonstration       PT Short Term Goals - 08/17/18 0804      PT SHORT TERM GOAL #1   Title  The patient will return demo HEP for vestibular adaptation, habituation.    Time  3    Period  Weeks    Status  Achieved    Target Date  08/15/18        PT Long Term Goals - 08/17/18 0804      PT LONG TERM GOAL #1   Title  The patient will be independent with progression of HEP.  (LTG due by 09/08/2018)    Time  6    Period  Weeks      PT LONG TERM GOAL #2   Title  The patient will have no subjective reports of dizziness with R dix hallpike to demo reduced motion sensitivity.    Time  6    Period  Weeks      PT LONG TERM GOAL #3   Title  The patient will perform all bed mobility without c/o dizziness.    Time  6    Period  Weeks      PT LONG TERM GOAL #4   Title  The patient will tolerate gaze adaptation x 1 viewing x 60 seconds without increased dizziness.    Time  6    Period  Weeks      PT LONG TERM GOAL #5   Title  Pt will demonstrate decreased sensitivity to visual stimulation and improved use of VOR as indicated by improvement in DVA to 2 line  difference    Baseline  (6, 3 - 3 line difference)    Time  6    Period  Weeks    Status  New             Plan - 08/17/18 0759    Clinical Impression Statement  Continued assessment of vestibular function with DVA and VOR cancellation with pt demonstrating 3 line difference in DVA but reporting dizziness at onset of head movement and significant dizziness when performing VOR cancellation which may indicate over reliance on visual information and visual motion sensitivity.  Reviewed HEP and provided pt with cues to attend to releasing mm tension (mindfulness/grounding) and use of breathing to dampen down sympathetic response.  Reviewed and progressed x1 viewing in sitting.  Pt tolerated well and will benefit from continued skilled PT services to progress towards LTG.    Personal Factors and Comorbidities  Comorbidity 1;Comorbidity 2;Comorbidity 3+    Comorbidities  HTN, diabetes, peripheral neuropathy    Examination-Activity Limitations  Bend;Bed Mobility;Reach Overhead    Examination-Participation Restrictions  Community Activity;Driving    Stability/Clinical Decision Making  Stable/Uncomplicated    Rehab Potential  Good    PT Frequency  1x / week    PT Duration  6 weeks    PT Treatment/Interventions  ADLs/Self Care Home Management;Neuromuscular re-education;Gait training;Functional mobility training;Therapeutic activities;Therapeutic exercise;Balance training;Patient/family education;Canalith Repostioning;Vestibular    PT Next Visit Plan  Try to perform repeated rolling and brandt daroff with one pillow - focus on breathing and reducing tension.  Progress x 1 viewing in sitting.  Add corner balance if pt able to tolerate.    PT Home Exercise Plan  [phone]    Consulted and Agree with Plan of Care  Patient       Patient will benefit from skilled therapeutic intervention in order to improve the following deficits and impairments:  Abnormal gait, Decreased balance, Dizziness  Visit Diagnosis: 1. Dizziness and giddiness   2. Other abnormalities of gait and mobility        Problem  List Patient Active Problem List   Diagnosis Date Noted  . Pain in joint, shoulder region 02/16/2018  . Chronic pain of both knees 10/06/2016  . Hot flashes 04/04/2015  . Polyarthropathy 12/20/2011  . Cutaneous lupus erythematosus 11/11/2011  . Hypovitaminosis D 07/20/2011  . Benign paroxysmal positional vertigo 01/29/2011  . Peripheral neuropathy 06/12/2010  . Allergic rhinitis 10/27/2009  . Pernicious anemia 05/20/2009  . OBESITY 05/26/2006  . Anxiety and depression 05/26/2006  . HYPERTENSION, BENIGN ESSENTIAL 05/26/2006  . HLD (hyperlipidemia) 03/17/2006  . GASTROESOPHAGEAL REFLUX, NO ESOPHAGITIS 03/17/2006   Rico Junker, PT, DPT 08/17/18    8:06 AM    [facility] [address] [place], [name], [address] Phone: [phone]   Fax:  [phone]  Name: KINZA GOUVEIA MRN: [id number] Date of Birth: [birth year]

## 2018-08-21 ENCOUNTER — Encounter: Payer: Self-pay | Admitting: Rehabilitative and Restorative Service Providers"

## 2018-08-21 ENCOUNTER — Other Ambulatory Visit: Payer: Self-pay

## 2018-08-21 ENCOUNTER — Ambulatory Visit: Payer: Medicare Other | Attending: Family Medicine | Admitting: Rehabilitative and Restorative Service Providers"

## 2018-08-21 DIAGNOSIS — R42 Dizziness and giddiness: Secondary | ICD-10-CM | POA: Diagnosis not present

## 2018-08-21 DIAGNOSIS — R2689 Other abnormalities of gait and mobility: Secondary | ICD-10-CM

## 2018-08-21 NOTE — Patient Instructions (Signed)
Access Code: [email or web address]  URL: [email or web address]  Date: 08/21/2018  Prepared by: Rudell Cobb   Program Notes  HOME WALKING: 5 minutes 2-3 times a day in the house or on level outdoor surfaces.   Exercises Brandt-Daroff Vestibular Exercise - 5 reps - 2x daily - 7x weekly Rolling right<>left sides for vestibular habituation - 5 reps - 1 sets                            - 2x daily - 7x weekly Standing Gaze Stabilization with Head Rotation - 1 reps - 1 sets - 2x daily - 7x weekly Standing Gaze Stabilization with Head Nod - 1 reps - 1 sets - 2x daily - 7x weekly Romberg Stance with Eyes Closed - 3 reps - 1 sets - 10-15 seconds hold - 2x daily - 7x weekly

## 2018-08-21 NOTE — Therapy (Signed)
[name], [name], [address] Phone: [phone]   Fax:  [phone]  Physical Therapy Treatment  Patient Details  Name: Jasmine Gregory MRN: [id number] Date of Birth: 06/25/1950 Referring Provider (PT): Dorris Singh, MD  CLINIC OPERATION CHANGES: Outpatient [facility] is open at lower capacity following universal masking, social distancing, and patient screening.  The patient's COVID risk of complications score is 3.  Encounter Date: 08/21/2018  PT End of Session - 08/21/18 1008    Visit Number  3    Number of Visits  6    Date for PT Re-Evaluation  09/08/18    Authorization Type  Medicare- 10th visit progress note    PT Start Time  0936    PT Stop Time  1015    PT Time Calculation (min)  39 min    Activity Tolerance  Patient tolerated treatment well    Behavior During Therapy  [facility] for tasks assessed/performed       Past Medical History:  Diagnosis Date  . Anemia   . Anxiety   . Arthritis   . Depression   . Diverticulosis   . Environmental and seasonal allergies   . GERD (gastroesophageal reflux disease)   . Hemorrhoids   . History of blood transfusion   . Hyperlipidemia   . Hypertension   . Hypovitaminosis D 07/20/2011   Vitamin D was 17 in May 2012. Patient has not taken any Vitamin D supplement since that time.     . Murmur, heart   . Obesity   . Vitamin B 12 deficiency 07/20/2011   Vitamin B12 was low at 169 in May 2012. No evidence of supplementation since that time.     Past Surgical History:  Procedure Laterality Date  . NO PAST SURGERIES      There were no vitals filed for this visit.  Subjective Assessment - 08/21/18 0936    Subjective  The patient still has dizziness with bending to pick up objects or look overhead to reach shelf.  She gets dizziness with all of her HEP and is trying to perform intermittently t/o the day.    Pertinent History  HTN, diabetes, other  history reviewed in epic    Patient Stated Goals  "learn something to alleviate the symptoms."    Currently in Pain?  No/denies                        Vestibular Treatment/Exercise - 08/21/18 0940      Vestibular Treatment/Exercise   Vestibular Treatment Provided  Habituation;Gaze      Nestor Lewandowsky   Number of Reps   3    Symptom Description   The patient has worse symptoms going to the R side than the L.   The patient had symptoms of being pulled down, not true spinning.  She tolerated 1 pillow well today.  Discussed doing this exercise at home.  Patient is using bed, however her feet do not touch the floor.  Therefore, will       Horizontal Roll   Number of Reps   3    Symptom Description   The patient tolerated rolling R<>L with only minimal symptoms today.  Switched to 2 pillows to begin rolling and will work to 1 pillow.      X1 Viewing Horizontal   Foot Position  seated    Comments  30 seconds with cues on speed; symtpoms provoked  are 2/10 with pressure behind R eye.      X1 Viewing Vertical   Foot Position  seated    Time  --   30 seconds   Comments  30 seconds with dizziness reported 1/10 "not bad" per report.              PT Education - 08/21/18 1145    Education Details  Updated and progressed HEP + walking program.    Person(s) Educated  Patient    Methods  Explanation;Demonstration;Handout    Comprehension  Verbalized understanding;Returned demonstration       PT Short Term Goals - 08/17/18 0804      PT SHORT TERM GOAL #1   Title  The patient will return demo HEP for vestibular adaptation, habituation.    Time  3    Period  Weeks    Status  Achieved    Target Date  08/15/18        PT Long Term Goals - 08/17/18 0804      PT LONG TERM GOAL #1   Title  The patient will be independent with progression of HEP.  (LTG due by 09/08/2018)    Time  6    Period  Weeks      PT LONG TERM GOAL #2   Title  The patient will have no  subjective reports of dizziness with R dix hallpike to demo reduced motion sensitivity.    Time  6    Period  Weeks      PT LONG TERM GOAL #3   Title  The patient will perform all bed mobility without c/o dizziness.    Time  6    Period  Weeks      PT LONG TERM GOAL #4   Title  The patient will tolerate gaze adaptation x 1 viewing x 60 seconds without increased dizziness.    Time  6    Period  Weeks      PT LONG TERM GOAL #5   Title  Pt will demonstrate decreased sensitivity to visual stimulation and improved use of VOR as indicated by improvement in DVA to 2 line difference    Baseline  (6, 3 - 3 line difference)    Time  6    Period  Weeks    Status  New            Plan - 08/21/18 1146    Clinical Impression Statement  The patient tolerated progression of HEP today shifting VOR to standing position, adding corner balance exercises and walking.  PT to continue working to [employer].    PT Treatment/Interventions  ADLs/Self Care Home Management;Neuromuscular re-education;Gait training;Functional mobility training;Therapeutic activities;Therapeutic exercise;Balance training;Patient/family education;Canalith Repostioning;Vestibular    PT Next Visit Plan  Check on HEP; progress rolling to one pillow.  Add further dynamic movement with walking, consider VOR cancellation/ visual stimulation tasks.    Consulted and Agree with Plan of Care  Patient       Patient will benefit from skilled therapeutic intervention in order to improve the following deficits and impairments:  Abnormal gait, Decreased balance, Dizziness  Visit Diagnosis: 1. Dizziness and giddiness   2. Other abnormalities of gait and mobility        Problem List Patient Active Problem List   Diagnosis Date Noted  . Pain in joint, shoulder region 02/16/2018  . Chronic pain of both knees 10/06/2016  . Hot flashes 04/04/2015  . Polyarthropathy 12/20/2011  . Cutaneous lupus erythematosus  11/11/2011  . Hypovitaminosis  D 07/20/2011  . Benign paroxysmal positional vertigo 01/29/2011  . Peripheral neuropathy 06/12/2010  . Allergic rhinitis 10/27/2009  . Pernicious anemia 05/20/2009  . OBESITY 05/26/2006  . Anxiety and depression 05/26/2006  . HYPERTENSION, BENIGN ESSENTIAL 05/26/2006  . HLD (hyperlipidemia) 03/17/2006  . GASTROESOPHAGEAL REFLUX, NO ESOPHAGITIS 03/17/2006    Lashawnna Lambrecht,PT 08/21/2018, 11:47 AM  [facility] [address] [place], [name], [address] Phone: [phone]   Fax:  [phone]  Name: Jasmine Gregory MRN: [id number] Date of Birth: 1950/03/12

## 2018-08-28 ENCOUNTER — Encounter: Payer: Self-pay | Admitting: Rehabilitative and Restorative Service Providers"

## 2018-08-28 ENCOUNTER — Ambulatory Visit: Payer: Medicare Other | Admitting: Rehabilitative and Restorative Service Providers"

## 2018-08-28 ENCOUNTER — Other Ambulatory Visit: Payer: Self-pay

## 2018-08-28 DIAGNOSIS — R42 Dizziness and giddiness: Secondary | ICD-10-CM | POA: Diagnosis not present

## 2018-08-28 DIAGNOSIS — R2689 Other abnormalities of gait and mobility: Secondary | ICD-10-CM

## 2018-08-28 NOTE — Patient Instructions (Addendum)
Getting Into / Out of Bed    Lower self to lie down on one side by raising legs and lowering head at the same time. Use arms to assist moving without twisting. Bend both knees to roll onto back if desired. To sit up, start from lying on side, and use same move-ments in reverse. Keep trunk aligned with legs.   Copyright  VHI. All rights reserved.    Access Code: [email or web address]  URL: [email or web address]  Date: 08/28/2018  Prepared by: Rudell Cobb   Program Notes  HOME WALKING: 5 minutes 2-3 times a day in the house or on level outdoor surfaces.   Exercises Brandt-Daroff Vestibular Exercise - 5 reps - 2x daily - 7x weekly Rolling right<>left sides for vestibular habituation - 5 reps - 1 sets                            - 2x daily - 7x weekly Standing Gaze Stabilization with Head Rotation - 1 reps - 1 sets - 2x daily - 7x weekly Standing Gaze Stabilization with Head Nod - 1 reps - 1 sets - 2x daily - 7x weekly Romberg Stance with Eyes Closed - 3 reps - 1 sets - 10-15 seconds hold - 2x daily - 7x weekly Standing Lumbar Spine Flexion Stretch Counter - 3 reps - 1 sets - 10 seconds hold - 2x daily - 7x weekly Standing Lumbar Extension - 3 reps - 1 sets - 10 seconds hold - 2x daily - 7x weekly

## 2018-08-28 NOTE — Therapy (Signed)
[facility] [address] [place], [name], [address] Phone: [phone]   Fax:  [phone]  Physical Therapy Treatment  Patient Details  Name: Jasmine Gregory MRN: [id number] Date of Birth: 23-Dec-1950 Referring Provider (PT): Dorris Singh, MD  CLINIC OPERATION CHANGES: Outpatient [facility] Rehab is open at lower capacity following universal masking, social distancing, and patient screening.  The patient's COVID risk of complications score is 3.  Encounter Date: 08/28/2018  PT End of Session - 08/28/18 1119    Visit Number  4    Number of Visits  6    Date for PT Re-Evaluation  09/08/18    Authorization Type  Medicare- 10th visit progress note    PT Start Time  0850    PT Stop Time  0930    PT Time Calculation (min)  40 min    Activity Tolerance  Patient tolerated treatment well    Behavior During Therapy  [facility] for tasks assessed/performed       Past Medical History:  Diagnosis Date  . Anemia   . Anxiety   . Arthritis   . Depression   . Diverticulosis   . Environmental and seasonal allergies   . GERD (gastroesophageal reflux disease)   . Hemorrhoids   . History of blood transfusion   . Hyperlipidemia   . Hypertension   . Hypovitaminosis D 07/20/2011   Vitamin D was 17 in May 2012. Patient has not taken any Vitamin D supplement since that time.     . Murmur, heart   . Obesity   . Vitamin B 12 deficiency 07/20/2011   Vitamin B12 was low at 169 in May 2012. No evidence of supplementation since that time.     Past Surgical History:  Procedure Laterality Date  . NO PAST SURGERIES      There were no vitals filed for this visit.  Subjective Assessment - 08/28/18 0852    Subjective  The patient reports that she did home exercises.  The habituation exercises made her low back hurt.  She was able to fit in walking more at [facility], but notes "I paid for it when I got back" (noting back pain).  She notes dizziness is  improving.  She notes occasional "faint" sensations of dizziness.  Patient notices dizziness 2 to 3 times/week.    Pertinent History  HTN, diabetes, other history reviewed in epic    Patient Stated Goals  "learn something to alleviate the symptoms."    Currently in Pain?  Yes   took 2 alleve   Pain Score  7     Pain Location  Back    Pain Orientation  Lower    Pain Descriptors / Indicators  Aching;Sore    Pain Type  Chronic pain    Pain Onset  1 to 4 weeks ago    Pain Frequency  Intermittent    Aggravating Factors   Back pain worsening over the past week    Pain Relieving Factors  unsure "it just hits a spot and it is there for awhile."  She notes the "arthritis travels"                       [facility] Adult PT Treatment/Exercise - 08/28/18 0912      Ambulation/Gait   Ambulation/Gait  Yes    Ambulation/Gait Assistance  6: Modified independent (Device/Increase time)    Ambulation Distance (Feet)  400 Feet    Assistive device  None    Ambulation Surface  Level;Indoor    Gait Comments  The patient had a slowed, more guarded gait pattern today due to low back pain.      Therapeutic Activites    Therapeutic Activities  Other Therapeutic Activities    Other Therapeutic Activities  discussed bed mobility and correct technique for performing sit<>sidelying exercise for dizziness to reduce rotation of lumbar spine and how to get into/out of bed without back pain  using log roll.       Neuro Re-ed    Neuro Re-ed Details   Figure 8 walking, standing horizontal head turns, standing vertical head turns.      Exercises   Exercises  Other Exercises    Other Exercises   Standing trunk flexion/trunk extension near countertop *added ot HEP. Supine marching.        Vestibular Treatment/Exercise - 08/28/18 0900      Vestibular Treatment/Exercise   Vestibular Treatment Provided  Habituation    Habituation Exercises  Laruth Bouchard Daroff;Horizontal Roll    Gaze Exercises  Eye/Head Exercise  Horizontal      Nestor Lewandowsky   Number of Reps   3    Symptom Description   Patient had a trace of dizziness with L sidelying that cleared in < 5 seconds.       Horizontal Roll   Number of Reps   1    Symptom Description   no symptoms of dizziness.       Eye/Head Exercise Horizontal   Comments  Performed VOR cancellation movng approximately 60 degree arc with visual fixation without c/o dizziness x 5 reps.             PT Education - 08/28/18 1119    Education Details  added instruction for getting into/out of bed log roll due to back pain and added lumbar stretching within pain free range.    Person(s) Educated  Patient    Methods  Explanation;Demonstration;Handout    Comprehension  Verbalized understanding;Returned demonstration       PT Short Term Goals - 08/17/18 0804      PT SHORT TERM GOAL #1   Title  The patient will return demo HEP for vestibular adaptation, habituation.    Time  3    Period  Weeks    Status  Achieved    Target Date  08/15/18        PT Long Term Goals - 08/17/18 0804      PT LONG TERM GOAL #1   Title  The patient will be independent with progression of HEP.  (LTG due by 09/08/2018)    Time  6    Period  Weeks      PT LONG TERM GOAL #2   Title  The patient will have no subjective reports of dizziness with R dix hallpike to demo reduced motion sensitivity.    Time  6    Period  Weeks      PT LONG TERM GOAL #3   Title  The patient will perform all bed mobility without c/o dizziness.    Time  6    Period  Weeks      PT LONG TERM GOAL #4   Title  The patient will tolerate gaze adaptation x 1 viewing x 60 seconds without increased dizziness.    Time  6    Period  Weeks      PT LONG TERM GOAL #5   Title  Pt will demonstrate decreased  sensitivity to visual stimulation and improved use of VOR as indicated by improvement in DVA to 2 line difference    Baseline  (6, 3 - 3 line difference)    Time  6    Period  Weeks    Status  New             Plan - 08/28/18 1122    Clinical Impression Statement  PT addressed pain to be able to increase overall mobility.  Patient's pain level reduced from 7/10 to 5/10 with mobility.  Patient feels symptoms improved with intermittent, trace sensations of dizziness.  PT to progress HEP.    Comorbidities  HTN, diabetes, peripheral neuropathy    PT Treatment/Interventions  ADLs/Self Care Home Management;Neuromuscular re-education;Gait training;Functional mobility training;Therapeutic activities;Therapeutic exercise;Balance training;Patient/family education;Canalith Repostioning;Vestibular    PT Next Visit Plan  ask about low back, add further balance activities as she is able to tolerate.    Consulted and Agree with Plan of Care  Patient       Patient will benefit from skilled therapeutic intervention in order to improve the following deficits and impairments:  Abnormal gait, Decreased balance, Dizziness  Visit Diagnosis: 1. Dizziness and giddiness   2. Other abnormalities of gait and mobility        Problem List Patient Active Problem List   Diagnosis Date Noted  . Pain in joint, shoulder region 02/16/2018  . Chronic pain of both knees 10/06/2016  . Hot flashes 04/04/2015  . Polyarthropathy 12/20/2011  . Cutaneous lupus erythematosus 11/11/2011  . Hypovitaminosis D 07/20/2011  . Benign paroxysmal positional vertigo 01/29/2011  . Peripheral neuropathy 06/12/2010  . Allergic rhinitis 10/27/2009  . Pernicious anemia 05/20/2009  . OBESITY 05/26/2006  . Anxiety and depression 05/26/2006  . HYPERTENSION, BENIGN ESSENTIAL 05/26/2006  . HLD (hyperlipidemia) 03/17/2006  . GASTROESOPHAGEAL REFLUX, NO ESOPHAGITIS 03/17/2006    Adams Hinch, PT 08/28/2018, 11:30 AM  [facility] [address] [place], [name], [address] Phone: [phone]   Fax:  [phone]  Name: Jasmine Gregory MRN: [id number] Date of Birth:  [birth year]

## 2018-08-30 DIAGNOSIS — R319 Hematuria, unspecified: Secondary | ICD-10-CM | POA: Diagnosis not present

## 2018-08-31 ENCOUNTER — Encounter: Payer: Self-pay | Admitting: Family Medicine

## 2018-08-31 ENCOUNTER — Other Ambulatory Visit: Payer: Self-pay

## 2018-08-31 ENCOUNTER — Ambulatory Visit (INDEPENDENT_AMBULATORY_CARE_PROVIDER_SITE_OTHER): Payer: Medicare Other | Admitting: Family Medicine

## 2018-08-31 DIAGNOSIS — D510 Vitamin B12 deficiency anemia due to intrinsic factor deficiency: Secondary | ICD-10-CM

## 2018-08-31 NOTE — Progress Notes (Signed)
   Subjective:    Patient ID: Jasmine Gregory, female    DOB: 28-May-1950, 68 y.o.   MRN: [id number]   CC: Vitamin b 12 shot and back pain  HPI: Ms. Jasmine Gregory is here today to get her vitamin b 12 shot for pernicios anemia. Her main complaint is constant sleepiness. She does not have any issues completing her daily tasks. She also has a concern for her low back pain that started 2 weeks ago. It radiates intermittently down either leg and her lower back feels better when leaning forward. She has tried a heating pad with temporary relief. She has not performed any heavy lift or twisting.    Smoking status reviewed  Review of Systems Per HPI, also denies recent illness, fever, headache, changes in vision, chest pain, shortness of breath, abdominal pain, N/V/D, weakness   Patient Active Problem List   Diagnosis Date Noted  . Pain in joint, shoulder region 02/16/2018  . Chronic pain of both knees 10/06/2016  . Hot flashes 04/04/2015  . Polyarthropathy 12/20/2011  . Cutaneous lupus erythematosus 11/11/2011  . Hypovitaminosis D 07/20/2011  . Benign paroxysmal positional vertigo 01/29/2011  . Peripheral neuropathy 06/12/2010  . Allergic rhinitis 10/27/2009  . Pernicious anemia 05/20/2009  . OBESITY 05/26/2006  . Anxiety and depression 05/26/2006  . HYPERTENSION, BENIGN ESSENTIAL 05/26/2006  . HLD (hyperlipidemia) 03/17/2006  . GASTROESOPHAGEAL REFLUX, NO ESOPHAGITIS 03/17/2006     Objective:  BP (!) 142/72   Pulse 86   SpO2 99%  Vitals and nursing note reviewed  General: Appears well, no acute distress. Age appropriate. Musculoskeletal: Hypertonic lumbar musculature bilaterally. PSIS tender to touch. SLR negative. Strength WNL bilaterally. Extremities: No edema or cyanosis. Skin: Warm and dry, no rashes noted Neuro: alert and oriented, no focal deficits Psych: normal affect  Assessment & Plan:    Pernicious anemia 1. Cyanocobalamin 18mcg injection. 2. Follow up in 1 week for  next injection and CBC.  Low back pain 1. Complete at home exercise 3 times a day from handout.  2. Heat before activities and ice after. 3. Ibuprofen 600mg  PRN 4. Follow up in 1 week or sooner if symptoms worsen.    Gerlene Gregory, [facility] PGY-1

## 2018-08-31 NOTE — Patient Instructions (Addendum)
It was very nice to meet you! Please follow up in 1 week for your next b12 shot as well as to follow up on improvement of back pain symptoms.    Back Exercises These exercises help to make your trunk and back strong. They also help to keep the lower back flexible. Doing these exercises can help to prevent back pain or lessen existing pain.  If you have back pain, try to do these exercises 2-3 times each day or as told by your doctor.  As you get better, do the exercises once each day. Repeat the exercises more often as told by your doctor.  To stop back pain from coming back, do the exercises once each day, or as told by your doctor. Exercises Single knee to chest Do these steps 3-5 times in a row for each leg: 1. Lie on your back on a firm bed or the floor with your legs stretched out. 2. Bring one knee to your chest. 3. Grab your knee or thigh with both hands and hold them it in place. 4. Pull on your knee until you feel a gentle stretch in your lower back or buttocks. 5. Keep doing the stretch for 10-30 seconds. 6. Slowly let go of your leg and straighten it. Pelvic tilt Do these steps 5-10 times in a row: 1. Lie on your back on a firm bed or the floor with your legs stretched out. 2. Bend your knees so they point up to the ceiling. Your feet should be flat on the floor. 3. Tighten your lower belly (abdomen) muscles to press your lower back against the floor. This will make your tailbone point up to the ceiling instead of pointing down to your feet or the floor. 4. Stay in this position for 5-10 seconds while you gently tighten your muscles and breathe evenly. Cat-cow Do these steps until your lower back bends more easily: 1. Get on your hands and knees on a firm surface. Keep your hands under your shoulders, and keep your knees under your hips. You may put padding under your knees. 2. Let your head hang down toward your chest. Tighten (contract) the muscles in your belly. Point your  tailbone toward the floor so your lower back becomes rounded like the back of a cat. 3. Stay in this position for 5 seconds. 4. Slowly lift your head. Let the muscles of your belly relax. Point your tailbone up toward the ceiling so your back forms a sagging arch like the back of a cow. 5. Stay in this position for 5 seconds.  Press-ups Do these steps 5-10 times in a row: 1. Lie on your belly (face-down) on the floor. 2. Place your hands near your head, about shoulder-width apart. 3. While you keep your back relaxed and keep your hips on the floor, slowly straighten your arms to raise the top half of your body and lift your shoulders. Do not use your back muscles. You may change where you place your hands in order to make yourself more comfortable. 4. Stay in this position for 5 seconds. 5. Slowly return to lying flat on the floor.  Bridges Do these steps 10 times in a row: 1. Lie on your back on a firm surface. 2. Bend your knees so they point up to the ceiling. Your feet should be flat on the floor. Your arms should be flat at your sides, next to your body. 3. Tighten your butt muscles and lift your butt off the  floor until your waist is almost as high as your knees. If you do not feel the muscles working in your butt and the back of your thighs, slide your feet 1-2 inches farther away from your butt. 4. Stay in this position for 3-5 seconds. 5. Slowly lower your butt to the floor, and let your butt muscles relax. If this exercise is too easy, try doing it with your arms crossed over your chest. Belly crunches Do these steps 5-10 times in a row: 1. Lie on your back on a firm bed or the floor with your legs stretched out. 2. Bend your knees so they point up to the ceiling. Your feet should be flat on the floor. 3. Cross your arms over your chest. 4. Tip your chin a little bit toward your chest but do not bend your neck. 5. Tighten your belly muscles and slowly raise your chest just enough  to lift your shoulder blades a tiny bit off of the floor. Avoid raising your body higher than that, because it can put too much stress on your low back. 6. Slowly lower your chest and your head to the floor. Back lifts Do these steps 5-10 times in a row: 1. Lie on your belly (face-down) with your arms at your sides, and rest your forehead on the floor. 2. Tighten the muscles in your legs and your butt. 3. Slowly lift your chest off of the floor while you keep your hips on the floor. Keep the back of your head in line with the curve in your back. Look at the floor while you do this. 4. Stay in this position for 3-5 seconds. 5. Slowly lower your chest and your face to the floor. Contact a doctor if:  Your back pain gets a lot worse when you do an exercise.  Your back pain does not get better 2 hours after you exercise. If you have any of these problems, stop doing the exercises. Do not do them again unless your doctor says it is okay. Get help right away if:  You have sudden, very bad back pain. If this happens, stop doing the exercises. Do not do them again unless your doctor says it is okay. This information is not intended to replace advice given to you by your health care provider. Make sure you discuss any questions you have with your health care provider. Document Released: 02/06/2010 Document Revised: 09/29/2017 Document Reviewed: 09/29/2017 Elsevier Patient Education  2020 [employer].

## 2018-08-31 NOTE — Assessment & Plan Note (Addendum)
1. Cyanocobalamin 1104mcg injection. 2. Follow up in 1 week for next injection and CBC.

## 2018-08-31 NOTE — Assessment & Plan Note (Addendum)
1. Complete at home exercise 3 times a day from handout.  2. Heat before activities and ice after. 3. Ibuprofen 600mg  PRN 4. Follow up in 1 week or sooner if symptoms worsen.

## 2018-09-04 ENCOUNTER — Ambulatory Visit: Payer: Medicare Other | Admitting: Rehabilitative and Restorative Service Providers"

## 2018-09-04 ENCOUNTER — Encounter: Payer: Self-pay | Admitting: Rehabilitative and Restorative Service Providers"

## 2018-09-04 ENCOUNTER — Other Ambulatory Visit: Payer: Self-pay

## 2018-09-04 DIAGNOSIS — R42 Dizziness and giddiness: Secondary | ICD-10-CM

## 2018-09-04 DIAGNOSIS — R2689 Other abnormalities of gait and mobility: Secondary | ICD-10-CM

## 2018-09-04 NOTE — Patient Instructions (Signed)
Access Code: [email or web address]  URL: [email or web address]  Date: 09/04/2018  Prepared by: Rudell Cobb   Program Notes  HOME WALKING: 5 minutes 2-3 times a day in the house or on level outdoor surfaces.   Exercises Brandt-Daroff Vestibular Exercise - 5 reps - 2x daily - 7x weekly Standing Gaze Stabilization with Head Rotation - 1 reps - 1 sets - 2x daily - 7x weekly Wide Stance with Eyes Closed on Foam Pad - 3 reps - 1 sets - 10-15 seconds. hold - 2x daily - 7x weekly Wide Stance with Head Rotation on Foam Pad - 10 reps - 1 sets - 2x daily - 7x weekly Standing Lumbar Spine Flexion Stretch Counter - 3 reps - 1 sets - 10 seconds hold - 2x daily - 7x weekly Standing Lumbar Extension - 3 reps - 1 sets - 10 seconds hold - 2x daily - 7x weekly

## 2018-09-04 NOTE — Therapy (Signed)
[facility] [address] [place], [name], [address] Phone: [phone]   Fax:  [phone]  Physical Therapy Treatment  Patient Details  Name: Jasmine Gregory MRN: [id number] Date of Birth: 09-28-1950 Referring Provider (PT): Dorris Singh, MD  CLINIC OPERATION CHANGES: Outpatient Neuro Rehab is open at lower capacity following universal masking, social distancing, and patient screening.  The patient's COVID risk of complications score is 3.  Encounter Date: 09/04/2018  PT End of Session - 09/04/18 0934    Visit Number  5    Number of Visits  6    Date for PT Re-Evaluation  09/08/18    Authorization Type  Medicare- 10th visit progress note    PT Start Time  0847    PT Stop Time  0930    PT Time Calculation (min)  43 min    Activity Tolerance  Patient tolerated treatment well    Behavior During Therapy  [facility] for tasks assessed/performed       Past Medical History:  Diagnosis Date  . Anemia   . Anxiety   . Arthritis   . Depression   . Diverticulosis   . Environmental and seasonal allergies   . GERD (gastroesophageal reflux disease)   . Hemorrhoids   . History of blood transfusion   . Hyperlipidemia   . Hypertension   . Hypovitaminosis D 07/20/2011   Vitamin D was 17 in May 2012. Patient has not taken any Vitamin D supplement since that time.     . Murmur, heart   . Obesity   . Vitamin B 12 deficiency 07/20/2011   Vitamin B12 was low at 169 in May 2012. No evidence of supplementation since that time.     Past Surgical History:  Procedure Laterality Date  . NO PAST SURGERIES      There were no vitals filed for this visit.  Subjective Assessment - 09/04/18 0848    Subjective  The patient reports her back pain is better, but not gone.  The patient reports no true dizziness over the weekend, but didn't do much.    Patient Stated Goals  "learn something to alleviate the symptoms."    Currently in Pain?  Yes     Pain Score  5    took allevel   Pain Location  Back    Pain Descriptors / Indicators  Aching    Pain Type  Chronic pain    Pain Onset  1 to 4 weeks ago    Pain Frequency  Intermittent    Aggravating Factors   worsening recently.    Pain Relieving Factors  alleve             Vestibular Assessment - 09/04/18 0909      Visual Acuity   Static  4   patient misses one letter in line 5 and 6, and 3 in line 7   Dynamic  4      Positional Testing   Dix-Hallpike  Dix-Hallpike Right;Dix-Hallpike Left    Horizontal Canal Testing  Horizontal Canal Right;Horizontal Canal Left      Dix-Hallpike Right   Dix-Hallpike Right Duration  c/o mild sensation of pressure in her head    Dix-Hallpike Right Symptoms  No nystagmus      Dix-Hallpike Left   Dix-Hallpike Left Duration  sensation of dizziness    Dix-Hallpike Left Symptoms  --   appeared to have a couple of beats of nystagmus that settled  Horizontal Canal Right   Horizontal Canal Right Duration  0    Horizontal Canal Right Symptoms  Normal      Horizontal Canal Left   Horizontal Canal Left Duration  0    Horizontal Canal Left Symptoms  Normal               [facility] Adult PT Treatment/Exercise - 09/04/18 0900      Ambulation/Gait   Ambulation/Gait  Yes    Ambulation/Gait Assistance  6: Modified independent (Device/Increase time)    Ambulation Distance (Feet)  500 Feet    Assistive device  None    Ambulation Surface  Level;Indoor    Gait Comments  Patient c/o back discomfort with continued ambulation.      Self-Care   Self-Care  Other Self-Care Comments    Other Self-Care Comments   Discussed eyewear due to SVA results.  Patient got new lenses that are bifocals, but was unable to tolerate them due to dizzy sensation.  PT recommended she f/u with eye doctor to discuss options.      Neuro Re-ed    Neuro Re-ed Details   Corner standing performing foam with eyes closed (began narrow and moved to wide for easier  performance); then feet together + head rotation and moved to wider stance.       Vestibular Treatment/Exercise - 09/04/18 0901      Vestibular Treatment/Exercise   Vestibular Treatment Provided  Habituation    Habituation Exercises  Laruth Bouchard Daroff;Horizontal Roll      Nestor Lewandowsky   Number of Reps   1    Symptom Description   No symptoms with sit<>sidelying at [facility] stime.      Horizontal Roll   Number of Reps   2    Symptom Description   No dizziness with rolling today.      X1 Viewing Horizontal   Foot Position  standing feet apart x 30 seconds without dizziness.    Comments  progressed with HEP to 60 seconds.              PT Short Term Goals - 08/17/18 0804      PT SHORT TERM GOAL #1   Title  The patient will return demo HEP for vestibular adaptation, habituation.    Time  3    Period  Weeks    Status  Achieved    Target Date  08/15/18        PT Long Term Goals - 09/04/18 0904      PT LONG TERM GOAL #1   Title  The patient will be independent with progression of HEP.  (LTG due by 09/08/2018)    Time  6    Period  Weeks      PT LONG TERM GOAL #2   Title  The patient will have no subjective reports of dizziness with R dix hallpike to demo reduced motion sensitivity.    Time  6    Period  Weeks      PT LONG TERM GOAL #3   Title  The patient will perform all bed mobility without c/o dizziness.    Time  6    Period  Weeks    Status  Achieved      PT LONG TERM GOAL #4   Title  The patient will tolerate gaze adaptation x 1 viewing x 60 seconds without increased dizziness.    Time  6    Period  Weeks  PT LONG TERM GOAL #5   Title  Pt will demonstrate decreased sensitivity to visual stimulation and improved use of VOR as indicated by improvement in DVA to 2 line difference    Baseline  (6, 3 - 3 line difference) at eval; today is accurate on line 4 for SVA and DVA.  *See self care for discussion on eye wear.    Time  6    Period  Weeks    Status   Achieved            Plan - 09/04/18 0929    Clinical Impression Statement  The patient met LTG for no dizziness wiht bed mobility, however with dix hallpike, she continues to c/o mild sense of vertigo.  PT treated symptomatically today with L epley's maneuver.  We progressed HEP to include more use of vestibular inputs for balance.    PT Treatment/Interventions  ADLs/Self Care Home Management;Neuromuscular re-education;Gait training;Functional mobility training;Therapeutic activities;Therapeutic exercise;Balance training;Patient/family education;Canalith Repostioning;Vestibular       Patient will benefit from skilled therapeutic intervention in order to improve the following deficits and impairments:  Abnormal gait, Decreased balance, Dizziness  Visit Diagnosis: 1. Dizziness and giddiness   2. Other abnormalities of gait and mobility        Problem List Patient Active Problem List   Diagnosis Date Noted  . Pain in joint, shoulder region 02/16/2018  . Chronic pain of both knees 10/06/2016  . Hot flashes 04/04/2015  . Polyarthropathy 12/20/2011  . Cutaneous lupus erythematosus 11/11/2011  . Hypovitaminosis D 07/20/2011  . Benign paroxysmal positional vertigo 01/29/2011  . Low back pain 09/10/2010  . Peripheral neuropathy 06/12/2010  . Allergic rhinitis 10/27/2009  . Pernicious anemia 05/20/2009  . OBESITY 05/26/2006  . Anxiety and depression 05/26/2006  . HYPERTENSION, BENIGN ESSENTIAL 05/26/2006  . HLD (hyperlipidemia) 03/17/2006  . GASTROESOPHAGEAL REFLUX, NO ESOPHAGITIS 03/17/2006    Greyson Peavy, PT 09/04/2018, 9:36 AM  [facility] [address] [place], [name], [address] Phone: [phone]   Fax:  [phone]  Name: Jasmine Gregory MRN: [id number] Date of Birth: [birth year]

## 2018-09-07 ENCOUNTER — Ambulatory Visit: Payer: Medicare Other

## 2018-09-07 ENCOUNTER — Other Ambulatory Visit: Payer: Self-pay

## 2018-09-07 DIAGNOSIS — D510 Vitamin B12 deficiency anemia due to intrinsic factor deficiency: Secondary | ICD-10-CM

## 2018-09-07 MED ORDER — CYANOCOBALAMIN 1000 MCG/ML IJ SOLN
1000.0000 ug | Freq: Once | INTRAMUSCULAR | Status: AC
  Administered 2018-09-14: 1000 ug via INTRAMUSCULAR

## 2018-09-07 NOTE — Progress Notes (Signed)
Patient presents in nurse clinic for B12 injection. Per note, looks like maybe a CBC, however no order was placed. Is patient to come back next week for 3rd injection? If she also needs a CBC, please place a future order.   B12 injection given LD, site unremarkable.

## 2018-09-11 ENCOUNTER — Other Ambulatory Visit: Payer: Self-pay

## 2018-09-11 ENCOUNTER — Ambulatory Visit: Payer: Medicare Other | Admitting: Rehabilitative and Restorative Service Providers"

## 2018-09-11 ENCOUNTER — Encounter: Payer: Self-pay | Admitting: Rehabilitative and Restorative Service Providers"

## 2018-09-11 DIAGNOSIS — R2689 Other abnormalities of gait and mobility: Secondary | ICD-10-CM | POA: Diagnosis not present

## 2018-09-11 DIAGNOSIS — R42 Dizziness and giddiness: Secondary | ICD-10-CM | POA: Diagnosis not present

## 2018-09-11 NOTE — Patient Instructions (Signed)
Access Code: [email or web address]  URL: [email or web address]  Date: 09/11/2018  Prepared by: Rudell Cobb   Program Notes  HOME WALKING: 5 minutes 2-3 times a day in the house or on level outdoor surfaces.   Exercises Brandt-Daroff Vestibular Exercise - 5 reps - 2x daily - 7x weekly Standing Gaze Stabilization with Head Rotation - 1 reps - 1 sets - 2x daily - 7x weekly Romberg Stance Eyes Closed on Foam Pad - 3 reps - 1 sets - 30 seconds hold - 2x daily - 7x weekly Standing Lumbar Spine Flexion Stretch Counter - 3 reps - 1 sets - 10 seconds hold - 2x daily - 7x weekly Standing Lumbar Extension - 3 reps - 1 sets - 10 seconds hold - 2x daily - 7x weekly

## 2018-09-11 NOTE — Therapy (Addendum)
[facility] [address] [place], [name], [address] Phone: [phone]   Fax:  [phone]  Physical Therapy Treatment and Discharge Summary  Patient Details  Name: Jasmine Gregory MRN: [id number] Date of Birth: 10/03/1950 Referring Provider (PT): Dorris Singh, MD   Encounter Date: 09/11/2018  PT End of Session - 09/11/18 1209    Visit Number  6    Number of Visits  6    Date for PT Re-Evaluation  09/08/18    Authorization Type  Medicare- 10th visit progress note    PT Start Time  0848    PT Stop Time  0915    PT Time Calculation (min)  27 min    Activity Tolerance  Patient tolerated treatment well    Behavior During Therapy  [facility] for tasks assessed/performed       Past Medical History:  Diagnosis Date  . Anemia   . Anxiety   . Arthritis   . Depression   . Diverticulosis   . Environmental and seasonal allergies   . GERD (gastroesophageal reflux disease)   . Hemorrhoids   . History of blood transfusion   . Hyperlipidemia   . Hypertension   . Hypovitaminosis D 07/20/2011   Vitamin D was 17 in May 2012. Patient has not taken any Vitamin D supplement since that time.     . Murmur, heart   . Obesity   . Vitamin B 12 deficiency 07/20/2011   Vitamin B12 was low at 169 in May 2012. No evidence of supplementation since that time.     Past Surgical History:  Procedure Laterality Date  . NO PAST SURGERIES      There were no vitals filed for this visit.  Subjective Assessment - 09/11/18 0847    Subjective  Patient's back is a lot better rated a 2/10.  Dizziness "not a lot" and she reports her "blood is low" noting she had to get a B12 shot.    Pertinent History  HTN, diabetes, other history reviewed in epic    Patient Stated Goals  "learn something to alleviate the symptoms."    Currently in Pain?  Yes    Pain Score  2     Pain Location  Back    Pain Orientation  Lower    Pain Descriptors / Indicators  Aching     Pain Type  Chronic pain    Pain Onset  1 to 4 weeks ago    Pain Frequency  Intermittent    Aggravating Factors   low grade low back pain    Pain Relieving Factors  alleve             Vestibular Assessment - 09/11/18 1209      Dix-Hallpike Right   Dix-Hallpike Right Duration  c/o pressure and lightheaded sensation, no dizziness    Dix-Hallpike Right Symptoms  No nystagmus      Dix-Hallpike Left   Dix-Hallpike Left Duration  c/o lightheadedness upon return to sitting    Dix-Hallpike Left Symptoms  No nystagmus               [facility] Adult PT Treatment/Exercise - 09/11/18 1207      Ambulation/Gait   Ambulation/Gait  Yes    Ambulation/Gait Assistance  6: Modified independent (Device/Increase time)    Ambulation Distance (Feet)  500 Feet    Assistive device  None    Ambulation Surface  Level;Indoor    Gait Comments  The patient's low  back is improved and she is able to walk without loss of balance today.      Neuro Re-ed    Neuro Re-ed Details   Reviewed gaze exercises x 1 viewing in standing, discussed use of eye glasses while performing task.  Reviewed corner balance exercises performing feet together on pillow with eyes closed.  Also performed feet together on pillow with head motion, however this is no longer challenging for the patinet (was able to d/c from HEP).               PT Short Term Goals - 08/17/18 0804      PT SHORT TERM GOAL #1   Title  The patient will return demo HEP for vestibular adaptation, habituation.    Time  3    Period  Weeks    Status  Achieved    Target Date  08/15/18        PT Long Term Goals - 09/11/18 1214      PT LONG TERM GOAL #1   Title  The patient will be independent with progression of HEP.    Status  Revised    Target Date  10/11/18       updated long term goal: PT Long Term Goals - 09/11/18 1214      PT LONG TERM GOAL #1   Title  The patient will be independent with progression of HEP.    Status   Revised    Target Date  10/11/18           Plan - 09/11/18 1210    Clinical Impression Statement  The patient has currently met LTGs for physical therapy.  She is c/o general weakness and feel this symptom is related to anemia for which she is undergoing medical treatment.  PT and patient discussed holding PT at this time to allow her to finish treatment for anemia and then schedule 1 follow up to ensure HEP is going well and her mobility continues to improve.    PT Frequency  1x / week    PT Duration  4 weeks    PT Treatment/Interventions  ADLs/Self Care Home Management;Neuromuscular re-education;Gait training;Functional mobility training;Therapeutic activities;Therapeutic exercise;Balance training;Patient/family education;Canalith Repostioning;Vestibular    PT Next Visit Plan  PT placing patient on medical hold at this time.  She would like to schedule one f/u visit once anemia is treated per her report to check on her progress with HEP.    Consulted and Agree with Plan of Care  Patient       Patient will benefit from skilled therapeutic intervention in order to improve the following deficits and impairments:  Abnormal gait, Decreased balance, Dizziness  Visit Diagnosis: Dizziness and giddiness - Plan: PT plan of care cert/re-cert  Other abnormalities of gait and mobility - Plan: PT plan of care cert/re-cert    PHYSICAL THERAPY DISCHARGE SUMMARY  Visits from Start of Care: 6  Current functional level related to goals / functional outcomes: See above   Remaining deficits: Unknown-- patient did not call to reschedule visit at this time.     Education / Equipment: Home program.  Plan: Patient agrees to discharge.  Patient goals were partially met. Patient is being discharged due to not returning since the last visit.  ?????          Thank you for the referral of this patient. Rudell Cobb, MPT  [place], [place] 09/11/2018, 12:17 PM  [facility] [address]  [place], [name], [address] Phone: [phone]   Fax:  [phone]  Name: Jasmine Gregory MRN: [id number] Date of Birth: 1950-09-08

## 2018-09-14 ENCOUNTER — Other Ambulatory Visit: Payer: Self-pay

## 2018-09-14 ENCOUNTER — Ambulatory Visit (INDEPENDENT_AMBULATORY_CARE_PROVIDER_SITE_OTHER): Payer: Medicare Other | Admitting: *Deleted

## 2018-09-14 ENCOUNTER — Other Ambulatory Visit: Payer: Self-pay | Admitting: Family Medicine

## 2018-09-14 DIAGNOSIS — D510 Vitamin B12 deficiency anemia due to intrinsic factor deficiency: Secondary | ICD-10-CM

## 2018-09-14 NOTE — Progress Notes (Signed)
Pt is here for her 3rd of 4 weekly injections of B12 and also a CBC.  Discussed with Dr. Owens Shark.  Pt will come in next week for her 4th injection, and then her next one will be in one month on 10/27/18 with Dr. Janus Molder so that a B12 can be drawn.   Pt agreeable with plan.  Injection given and tolerated well.  Christen Bame, CMA

## 2018-09-15 LAB — CBC
Hematocrit: 32.6 % — ABNORMAL LOW (ref 34.0–46.6)
Hemoglobin: 10.3 g/dL — ABNORMAL LOW (ref 11.1–15.9)
MCH: 26.8 pg (ref 26.6–33.0)
MCHC: 31.6 g/dL (ref 31.5–35.7)
MCV: 85 fL (ref 79–97)
Platelets: 167 10*3/uL (ref 150–450)
RBC: 3.85 x10E6/uL (ref 3.77–5.28)
RDW: 13.2 % (ref 11.7–15.4)
WBC: 6.1 10*3/uL (ref 3.4–10.8)

## 2018-09-21 ENCOUNTER — Ambulatory Visit: Payer: Medicare Other

## 2018-09-26 ENCOUNTER — Other Ambulatory Visit: Payer: Self-pay

## 2018-09-26 ENCOUNTER — Ambulatory Visit (INDEPENDENT_AMBULATORY_CARE_PROVIDER_SITE_OTHER): Payer: Medicare Other

## 2018-09-26 DIAGNOSIS — D510 Vitamin B12 deficiency anemia due to intrinsic factor deficiency: Secondary | ICD-10-CM

## 2018-09-26 MED ORDER — CYANOCOBALAMIN 1000 MCG/ML IJ SOLN
1000.0000 ug | Freq: Once | INTRAMUSCULAR | Status: AC
  Administered 2018-09-26: 1000 ug via INTRAMUSCULAR

## 2018-09-26 NOTE — Progress Notes (Signed)
Patient presents in nurse clinic for her 4th B12 injection. This is her last one. Patient to return to clinic for PCP visit on 10/9. Patient aware of plan. Injection given, LD. Patient tolerated well.

## 2018-10-24 ENCOUNTER — Other Ambulatory Visit: Payer: Self-pay | Admitting: Student in an Organized Health Care Education/Training Program

## 2018-10-24 ENCOUNTER — Other Ambulatory Visit: Payer: Self-pay | Admitting: Family Medicine

## 2018-10-24 DIAGNOSIS — I10 Essential (primary) hypertension: Secondary | ICD-10-CM

## 2018-10-25 ENCOUNTER — Other Ambulatory Visit: Payer: Self-pay | Admitting: Student in an Organized Health Care Education/Training Program

## 2018-10-25 ENCOUNTER — Other Ambulatory Visit: Payer: Self-pay | Admitting: Family Medicine

## 2018-10-25 DIAGNOSIS — I10 Essential (primary) hypertension: Secondary | ICD-10-CM

## 2018-10-27 ENCOUNTER — Other Ambulatory Visit: Payer: Self-pay

## 2018-10-27 ENCOUNTER — Ambulatory Visit (INDEPENDENT_AMBULATORY_CARE_PROVIDER_SITE_OTHER): Payer: Medicare Other | Admitting: Family Medicine

## 2018-10-27 ENCOUNTER — Encounter: Payer: Self-pay | Admitting: Family Medicine

## 2018-10-27 VITALS — BP 142/82 | HR 86

## 2018-10-27 DIAGNOSIS — E785 Hyperlipidemia, unspecified: Secondary | ICD-10-CM | POA: Diagnosis not present

## 2018-10-27 DIAGNOSIS — Z23 Encounter for immunization: Secondary | ICD-10-CM

## 2018-10-27 DIAGNOSIS — D510 Vitamin B12 deficiency anemia due to intrinsic factor deficiency: Secondary | ICD-10-CM | POA: Diagnosis not present

## 2018-10-27 DIAGNOSIS — M130 Polyarthritis, unspecified: Secondary | ICD-10-CM

## 2018-10-27 DIAGNOSIS — I10 Essential (primary) hypertension: Secondary | ICD-10-CM

## 2018-10-27 DIAGNOSIS — E559 Vitamin D deficiency, unspecified: Secondary | ICD-10-CM | POA: Diagnosis not present

## 2018-10-27 MED ORDER — SIMVASTATIN 40 MG PO TABS: 40.0000 mg | ORAL_TABLET | Freq: Every day | ORAL | 0 refills | Status: DC

## 2018-10-27 MED ORDER — LISINOPRIL-HYDROCHLOROTHIAZIDE 20-12.5 MG PO TABS: 2.0000 | ORAL_TABLET | Freq: Every day | ORAL | 0 refills | Status: DC

## 2018-10-27 NOTE — Assessment & Plan Note (Addendum)
Right shoulder: -Continue with tylenol PRN -Lidocaine patches OTC -Will reasses at next visit in 1 month  Left foot pain: -Continue tylenol PRN -Attemp tennis ball foot massage -Will reasses at next visit in 1 month

## 2018-10-27 NOTE — Assessment & Plan Note (Addendum)
Hx of low vitamin D. Taking vitamin intermittently -Encourage resuming vitamin D on a regular basis -Obtain vitamin d level -f/u PRN

## 2018-10-27 NOTE — Assessment & Plan Note (Addendum)
-  Continue simvastatin 40mg  QD, refilled today -Lipid panel -f/u in 1 year for repeat lipid panel or sooner if needed

## 2018-10-27 NOTE — Progress Notes (Signed)
   Subjective:    Patient ID: Jasmine Gregory, female    DOB: [birth year], 68 y.o.   MRN: [id number]   CC: Follow up for Vitamin B 12 level  HPI: Jasmine Gregory is here today to check the level of her vitamin b 12. She has history of pernicious anemia and we have been giving weekly vitamin b 12 injection. She says that her energy and back pain from her last visit has improved. Today she has some right shoulder pain that is relieved by tylenol. She also endorses left foot pain that is intermittent but stabbing in nature that is more concentrated on the lateral aspect of her foot. It is not associated with any time of the day or any specific activity. Other than those few thing she hopeful that her vitamin b 12 level has risen.   Smoking status reviewed  Review of Systems Per HPI, also denies recent illness, fever, headache, changes in vision, chest pain, shortness of breath, abdominal pain, N/V/D, weakness   Patient Active Problem List   Diagnosis Date Noted  . Pain in joint, shoulder region 02/16/2018  . Chronic pain of both knees 10/06/2016  . Hot flashes 04/04/2015  . Polyarthropathy 12/20/2011  . Cutaneous lupus erythematosus 11/11/2011  . Hypovitaminosis D 07/20/2011  . Benign paroxysmal positional vertigo 01/29/2011  . Low back pain 09/10/2010  . Peripheral neuropathy 06/12/2010  . Allergic rhinitis 10/27/2009  . Pernicious anemia 05/20/2009  . OBESITY 05/26/2006  . Anxiety and depression 05/26/2006  . HYPERTENSION, BENIGN ESSENTIAL 05/26/2006  . HLD (hyperlipidemia) 03/17/2006  . GASTROESOPHAGEAL REFLUX, NO ESOPHAGITIS 03/17/2006     Objective:  BP (!) 142/82   Pulse 86   SpO2 99%  Vitals and nursing note reviewed  General: NAD, pleasant Cardiac: RRR, normal heart sounds, no murmurs Respiratory: CTAB, normal effort Abdomen: soft, nontender, nondistended Extremities: no edema or cyanosis. WWP. Skin: warm and dry, no rashes noted Neuro: alert and oriented, no focal  deficits Psych: normal affect  Assessment & Plan:    Hypovitaminosis D Hx of low vitamin D. Taking vitamin intermittently -Encourage resuming vitamin D on a regular basis -Obtain vitamin d level -f/u PRN  Pernicious anemia Hx of pernicious anemia. Has received weekly injections for 1 month. -Vitamin b 12 level -Encourage PO vitamin b  -f/u in 1 month   HLD (hyperlipidemia) -Continue simvastatin 40mg  QD, refilled today -Lipid panel -f/u in 1 year for repeat lipid panel or sooner if needed  Polyarthropathy Right shoulder: -Continue with tylenol PRN -Lidocaine patches OTC -Will reasses at next visit in 1 month  Left foot pain: -Continue tylenol PRN -Attemp tennis ball foot massage -Will reasses at next visit in 1 month  HYPERTENSION, BENIGN ESSENTIAL BP in office today 142/82 -Continue Zestoretic 20-12.5 mg BID; refilled medication today -f/u PRN    Gerlene Fee, [place] PGY-1

## 2018-10-27 NOTE — Assessment & Plan Note (Addendum)
BP in office today 142/82 -Continue Zestoretic 20-12.5 mg BID; refilled medication today -f/u PRN

## 2018-10-27 NOTE — Patient Instructions (Signed)
It was very nice to see you today. Please enjoy the rest of your week. Today you were seen for vitamin b 12 deficiency, shoulder pain and foot pain. We retrieved vitamin b 12 levels, vitamin d levels and cholesterol levels. I have re-prescribed simvastatin for your cholesterol, and Zestoretic for your high blood pressure. Follow up in 1 month for your joint pain or sooner if needed.   Please call the clinic at [phone] if your symptoms worsen or you have any concerns. It was our pleasure to serve you.

## 2018-10-27 NOTE — Assessment & Plan Note (Addendum)
Hx of pernicious anemia. Has received weekly injections for 1 month. -Vitamin b 12 level -Encourage PO vitamin b  -f/u in 1 month

## 2018-10-28 LAB — LIPID PANEL
Chol/HDL Ratio: 3.4 ratio (ref 0.0–4.4)
Cholesterol, Total: 145 mg/dL (ref 100–199)
HDL: 43 mg/dL (ref >39)
LDL Chol Calc (NIH): 81 mg/dL (ref 0–99)
Triglycerides: 115 mg/dL (ref 0–149)
VLDL Cholesterol Cal: 21 mg/dL (ref 5–40)

## 2018-10-28 LAB — VITAMIN D 25 HYDROXY (VIT D DEFICIENCY, FRACTURES): Vit D, 25-Hydroxy: 13.6 ng/mL — ABNORMAL LOW (ref 30.0–100.0)

## 2018-10-28 LAB — VITAMIN B12: Vitamin B-12: 293 pg/mL (ref 232–1245)

## 2018-11-03 ENCOUNTER — Telehealth: Payer: Self-pay | Admitting: Family Medicine

## 2018-11-03 NOTE — Telephone Encounter (Signed)
Called Ms. Barret with results of lipid panel which was wnl. Vitamin D is low will resume taking vitamin d at home will prescribe at next visit if she need more. Vitamin B 12 levels are within normal limits so injection was successful. Should continue taking Vitamin b12 supplement orally. Will continue to stress this at out next visit.

## 2018-12-29 ENCOUNTER — Encounter: Payer: Self-pay | Admitting: Family Medicine

## 2018-12-29 ENCOUNTER — Ambulatory Visit (INDEPENDENT_AMBULATORY_CARE_PROVIDER_SITE_OTHER): Payer: Medicare Other | Admitting: Family Medicine

## 2018-12-29 ENCOUNTER — Other Ambulatory Visit: Payer: Self-pay

## 2018-12-29 VITALS — BP 130/60 | HR 90 | Wt 224.0 lb

## 2018-12-29 DIAGNOSIS — R6889 Other general symptoms and signs: Secondary | ICD-10-CM | POA: Insufficient documentation

## 2018-12-29 DIAGNOSIS — D510 Vitamin B12 deficiency anemia due to intrinsic factor deficiency: Secondary | ICD-10-CM | POA: Diagnosis not present

## 2018-12-29 DIAGNOSIS — E559 Vitamin D deficiency, unspecified: Secondary | ICD-10-CM

## 2018-12-29 MED ORDER — VITAMIN D (ERGOCALCIFEROL) 1.25 MG (50000 UNIT) PO CAPS: 50000.0000 [IU] | ORAL_CAPSULE | ORAL | 1 refills | Status: DC

## 2018-12-29 NOTE — Assessment & Plan Note (Signed)
Last result: 13.6 10/27/2018. Not currently taking supplement. Low level could like contribute to fatigue in combination with low vitamin b 12 levels. More than likely not getting adequate sun exposure d/t COVID and tendency to stay inside during this time.  -Repeat vitamin d levels -Starting 50,000 units vit D 1 tab weekly for 1 month -f/u 1 month repeat labs

## 2018-12-29 NOTE — Patient Instructions (Signed)
It was very nice to meet you today. Please enjoy the rest of your week. Today you were seen for lab work. I have prescribed vitamin d supplement. Follow up in 1 month for repeat labwork or sooner if needed.   Please call the clinic at [phone] if your symptoms worsen or you have any concerns. It was our pleasure to serve you.

## 2018-12-29 NOTE — Assessment & Plan Note (Signed)
S/p injection treatment. Currently on oral supplements. Last vitamin b 12 level 10/27/2018 293. Has peripheral neuropathy relieved by gabapentin. With fatigue. Likely due to a combination of low vitamin d and b.  -Vitamin b 12 level -continue oral supplement -f/u 1 month for repeat labs

## 2018-12-29 NOTE — Assessment & Plan Note (Signed)
Generally cold but mostly her hands. Associated fatigue and insomnia. Likely d/t combination of low vitamin levels. But possible that treatment of pernicious anemia, and low vitamin d could unmask another source of anemia or issues with the thyroid. Will obtain thyroid labs as well as CBC. Last TSH 2017 1.2. Last hgb 09/14/2018 10.3 which is patient's baseline. -TSH, T4, T3 -CBC

## 2018-12-29 NOTE — Progress Notes (Signed)
  Patient Name: Jasmine Gregory Date of Birth: 06/29/1950 Date of Visit: 12/29/18 PCP: Gerlene Fee, DO  Chief Complaint:   Subjective: Jasmine Gregory is a pleasant 68 y.o. with medical history significant for pernicious anemia presenting today for repeat labs.  Pernicious Anemia Says she is doing well and hopes that her vitamin b 12 levels continue to improve. She would be happy to space out labwork if her next b 12 is normal. She does have peripheral neuropathy that is worst in the left leg but takes her gabapentin and that helps.  Cold intolerance She is concerned about cold hands more so than her feet. She also has fatigue and insomnia. She attributes the insomnia to her occasional joint pains from arthritis.   Hypovitaminosis D Not currently on oral supplement. Has not been doing many activities due to [place].  ROS: Per HPI.   I have reviewed the patient's medical, surgical, family, and social history as appropriate.  Vitals:   12/29/18 1424  BP: 130/60  Pulse: 90  SpO2: 98%    General: Appears well, no acute distress. Age appropriate. Sitting in chair. Cardiac: RRR, normal heart sounds, no murmurs Respiratory: CTAB, normal effort Extremities: No edema or cyanosis. Skin: No rashes noted Neuro: alert and oriented, no focal deficits Psych: normal affect   Pernicious anemia S/p injection treatment. Currently on oral supplements. Last vitamin b 12 level 10/27/2018 293. Has peripheral neuropathy relieved by gabapentin. With fatigue. Likely due to a combination of low vitamin d and b.  -Vitamin b 12 level -continue oral supplement -f/u 1 month for repeat labs  Hypovitaminosis D Last result: 13.6 10/27/2018. Not currently taking supplement. Low level could like contribute to fatigue in combination with low vitamin b 12 levels. More than likely not getting adequate sun exposure d/t COVID and tendency to stay inside during this time.  -Repeat vitamin d levels -Starting  50,000 units vit D 1 tab weekly for 1 month -f/u 1 month repeat labs  Cold intolerance Generally cold but mostly her hands. Associated fatigue and insomnia. Likely d/t combination of low vitamin levels. But possible that treatment of pernicious anemia, and low vitamin d could unmask another source of anemia or issues with the thyroid. Will obtain thyroid labs as well as CBC. Last TSH 2017 1.2. Last hgb 09/14/2018 10.3 which is patient's baseline. -TSH, T4, T3 -CBC   Return to care in 1 month for repeat lab work.   Gerlene Fee, [place] PGY-1   *Patient discussed with OB Fellow Dr. Phill Myron

## 2018-12-30 ENCOUNTER — Telehealth: Payer: Self-pay | Admitting: Family Medicine

## 2018-12-30 LAB — CBC WITH DIFFERENTIAL/PLATELET
Basophils Absolute: 0 10*3/uL (ref 0.0–0.2)
Basos: 0 %
EOS (ABSOLUTE): 0.3 10*3/uL (ref 0.0–0.4)
Eos: 4 %
Hematocrit: 33.6 % — ABNORMAL LOW (ref 34.0–46.6)
Hemoglobin: 10.8 g/dL — ABNORMAL LOW (ref 11.1–15.9)
Immature Grans (Abs): 0 10*3/uL (ref 0.0–0.1)
Immature Granulocytes: 0 %
Lymphocytes Absolute: 2.2 10*3/uL (ref 0.7–3.1)
Lymphs: 35 %
MCH: 27.6 pg (ref 26.6–33.0)
MCHC: 32.1 g/dL (ref 31.5–35.7)
MCV: 86 fL (ref 79–97)
Monocytes Absolute: 0.7 10*3/uL (ref 0.1–0.9)
Monocytes: 10 %
Neutrophils Absolute: 3.1 10*3/uL (ref 1.4–7.0)
Neutrophils: 51 %
Platelets: 168 10*3/uL (ref 150–450)
RBC: 3.91 x10E6/uL (ref 3.77–5.28)
RDW: 13.1 % (ref 11.7–15.4)
WBC: 6.3 10*3/uL (ref 3.4–10.8)

## 2018-12-30 LAB — T3, FREE: T3, Free: 3.2 pg/mL (ref 2.0–4.4)

## 2018-12-30 LAB — TSH: TSH: 0.99 u[IU]/mL (ref 0.450–4.500)

## 2018-12-30 LAB — VITAMIN B12: Vitamin B-12: 198 pg/mL — ABNORMAL LOW (ref 232–1245)

## 2018-12-30 LAB — T4, FREE: Free T4: 1.16 ng/dL (ref 0.82–1.77)

## 2018-12-30 LAB — VITAMIN D 25 HYDROXY (VIT D DEFICIENCY, FRACTURES): Vit D, 25-Hydroxy: 13.3 ng/mL — ABNORMAL LOW (ref 30.0–100.0)

## 2018-12-30 NOTE — Telephone Encounter (Signed)
Called Ms. Hasty this afternoon give results of blood work. Vitamin B12 is down at 193, vitamin D is down at 13.3.  Hemoglobin is stable at 10.8.  And thyroid labs are within normal limits.  She has a follow-up appointment with me in 1 month.  She is currently taking 1000 mcg of vitamin B12 orally and I have asked her to take 2000 and so 2 tabs of the 1000 mcg of vitamin B12.  At our visit yesterday I also prescribed vitamin D at 50,000 mcg to be taken weekly for a month.  At our follow-up visit we will repeat labs.

## 2019-01-22 ENCOUNTER — Telehealth: Payer: Self-pay

## 2019-01-22 DIAGNOSIS — D510 Vitamin B12 deficiency anemia due to intrinsic factor deficiency: Secondary | ICD-10-CM

## 2019-01-22 DIAGNOSIS — E559 Vitamin D deficiency, unspecified: Secondary | ICD-10-CM

## 2019-01-22 NOTE — Telephone Encounter (Signed)
Pt has an appt with PCP on 02/23/2019. Pt would like to get her labs work drawn before then so she can go over the results on 2/5. She knows she needs her B12 levels checked. Not sure what else her PCP would like to get tested. Please call pt when the orders are in and she will come get them drawn. [phone]. Ottis Stain, CMA

## 2019-01-23 NOTE — Addendum Note (Signed)
Addended by: Caralee Ates on: 01/23/2019 08:11 AM   Modules accepted: Orders

## 2019-01-24 NOTE — Telephone Encounter (Signed)
Spoke to pt. She is going to be tested for COVID. Will come and get lab work done closer to time of appt. Ottis Stain, CMA

## 2019-01-25 ENCOUNTER — Ambulatory Visit: Payer: Medicare Other | Attending: Internal Medicine

## 2019-01-25 DIAGNOSIS — Z20822 Contact with and (suspected) exposure to covid-19: Secondary | ICD-10-CM

## 2019-01-27 ENCOUNTER — Telehealth: Payer: Self-pay | Admitting: General Practice

## 2019-01-27 LAB — NOVEL CORONAVIRUS, NAA: SARS-CoV-2, NAA: NOT DETECTED

## 2019-01-27 NOTE — Telephone Encounter (Signed)
Negative COVID results given. Patient results "NOT Detected." Caller expressed understanding. ° °

## 2019-01-30 ENCOUNTER — Other Ambulatory Visit: Payer: Medicare Other

## 2019-01-30 ENCOUNTER — Other Ambulatory Visit: Payer: Self-pay

## 2019-01-30 DIAGNOSIS — E559 Vitamin D deficiency, unspecified: Secondary | ICD-10-CM

## 2019-01-30 DIAGNOSIS — D510 Vitamin B12 deficiency anemia due to intrinsic factor deficiency: Secondary | ICD-10-CM

## 2019-01-31 LAB — VITAMIN D 25 HYDROXY (VIT D DEFICIENCY, FRACTURES): Vit D, 25-Hydroxy: 29.5 ng/mL — ABNORMAL LOW (ref 30.0–100.0)

## 2019-01-31 LAB — VITAMIN B12: Vitamin B-12: 565 pg/mL (ref 232–1245)

## 2019-02-01 MED ORDER — VITAMIN D (ERGOCALCIFEROL) 1.25 MG (50000 UNIT) PO CAPS: 50000.0000 [IU] | ORAL_CAPSULE | ORAL | 1 refills | Status: DC

## 2019-02-01 NOTE — Addendum Note (Signed)
Addended by: Caralee Ates on: 02/01/2019 03:07 PM   Modules accepted: Orders

## 2019-02-01 NOTE — Progress Notes (Addendum)
Patient called. Vitamin b 12 wnl. Vitamin D lower normal range. Continue vitamin supplement and repeat labs in 1 month. If wnl will space out labs.   Gerlene Fee, [facility] PGY-1

## 2019-02-22 ENCOUNTER — Other Ambulatory Visit: Payer: Medicare Other

## 2019-02-22 ENCOUNTER — Other Ambulatory Visit: Payer: Self-pay

## 2019-02-22 DIAGNOSIS — E559 Vitamin D deficiency, unspecified: Secondary | ICD-10-CM

## 2019-02-22 DIAGNOSIS — D510 Vitamin B12 deficiency anemia due to intrinsic factor deficiency: Secondary | ICD-10-CM

## 2019-02-23 ENCOUNTER — Ambulatory Visit: Payer: Medicare Other | Admitting: Family Medicine

## 2019-02-23 LAB — VITAMIN D 25 HYDROXY (VIT D DEFICIENCY, FRACTURES): Vit D, 25-Hydroxy: 33.6 ng/mL (ref 30.0–100.0)

## 2019-02-23 LAB — VITAMIN B12: Vitamin B-12: 631 pg/mL (ref 232–1245)

## 2019-02-27 ENCOUNTER — Ambulatory Visit: Payer: Medicare Other | Attending: Internal Medicine

## 2019-02-27 DIAGNOSIS — Z20822 Contact with and (suspected) exposure to covid-19: Secondary | ICD-10-CM

## 2019-02-27 NOTE — Addendum Note (Signed)
Addended by: Caralee Ates on: 02/27/2019 08:24 PM   Modules accepted: Orders

## 2019-02-27 NOTE — Progress Notes (Addendum)
Patient called. Results were discussed. She will return in 3 months for repeat vitamin b12 and vitamin d. Continue supplements. Future order placed starting 05/27/2019.

## 2019-02-28 ENCOUNTER — Telehealth: Payer: Self-pay | Admitting: *Deleted

## 2019-02-28 LAB — NOVEL CORONAVIRUS, NAA: SARS-CoV-2, NAA: NOT DETECTED

## 2019-02-28 NOTE — Telephone Encounter (Signed)
-----   Message from [place], DO sent at 02/27/2019  8:09 PM EST ----- Normal Vitamin B12 x2. Normal Vitamin D x1. Continue vitamin supplements and repeat labs in 3 months.

## 2019-02-28 NOTE — Telephone Encounter (Signed)
Pt informed. Ily Denno, CMA  

## 2019-04-16 ENCOUNTER — Other Ambulatory Visit: Payer: Medicare Other

## 2019-04-16 ENCOUNTER — Other Ambulatory Visit: Payer: Self-pay

## 2019-04-16 DIAGNOSIS — D510 Vitamin B12 deficiency anemia due to intrinsic factor deficiency: Secondary | ICD-10-CM

## 2019-04-16 DIAGNOSIS — E559 Vitamin D deficiency, unspecified: Secondary | ICD-10-CM

## 2019-04-17 ENCOUNTER — Other Ambulatory Visit: Payer: Self-pay

## 2019-04-17 DIAGNOSIS — E559 Vitamin D deficiency, unspecified: Secondary | ICD-10-CM

## 2019-04-17 LAB — VITAMIN D 25 HYDROXY (VIT D DEFICIENCY, FRACTURES): Vit D, 25-Hydroxy: 39.7 ng/mL (ref 30.0–100.0)

## 2019-04-17 LAB — VITAMIN B12: Vitamin B-12: 546 pg/mL (ref 232–1245)

## 2019-04-17 MED ORDER — VITAMIN D (ERGOCALCIFEROL) 1.25 MG (50000 UNIT) PO CAPS: 50000.0000 [IU] | ORAL_CAPSULE | ORAL | 1 refills | Status: DC

## 2019-07-16 ENCOUNTER — Telehealth: Payer: Self-pay | Admitting: Family Medicine

## 2019-07-16 NOTE — Telephone Encounter (Signed)
Schedule patient to come in for an office visit 7/19 @335pm . We will repeat blood work then. Patient asked to bring in all medications.

## 2019-07-16 NOTE — Telephone Encounter (Signed)
Patient is calling and would like to know if Dr. Erskine Speed- Lott could place an order for her to have lab work done to check her b12. She does not want to wait until Dr. Erskine Speed- Lotts next available appointment. If she can just have the follow up lab work placed she would like for someone to call her and let her know when this is done.   The best call back is [phone].

## 2019-07-18 ENCOUNTER — Other Ambulatory Visit: Payer: Self-pay | Admitting: Family Medicine

## 2019-07-18 DIAGNOSIS — I10 Essential (primary) hypertension: Secondary | ICD-10-CM

## 2019-07-20 ENCOUNTER — Other Ambulatory Visit: Payer: Self-pay | Admitting: Family Medicine

## 2019-07-20 DIAGNOSIS — I10 Essential (primary) hypertension: Secondary | ICD-10-CM

## 2019-08-06 ENCOUNTER — Other Ambulatory Visit: Payer: Self-pay

## 2019-08-06 ENCOUNTER — Ambulatory Visit (INDEPENDENT_AMBULATORY_CARE_PROVIDER_SITE_OTHER): Payer: Medicare Other | Admitting: Family Medicine

## 2019-08-06 VITALS — BP 135/75 | HR 93 | Ht 63.0 in | Wt 224.6 lb

## 2019-08-06 DIAGNOSIS — R5383 Other fatigue: Secondary | ICD-10-CM | POA: Diagnosis not present

## 2019-08-06 DIAGNOSIS — E559 Vitamin D deficiency, unspecified: Secondary | ICD-10-CM | POA: Diagnosis not present

## 2019-08-06 DIAGNOSIS — D510 Vitamin B12 deficiency anemia due to intrinsic factor deficiency: Secondary | ICD-10-CM

## 2019-08-06 DIAGNOSIS — M159 Polyosteoarthritis, unspecified: Secondary | ICD-10-CM | POA: Diagnosis not present

## 2019-08-06 NOTE — Progress Notes (Signed)
    SUBJECTIVE:   CHIEF COMPLAINT / HPI:   Fatigue Endorses she is sleeping a lot and always feels tired. When asked to go places she can get up and go but if given the choice she will stay inside. She does not endorse depression and voices understanding that her chronic pain could play a role. Voiced understanding that we may not be able to get to the bottom of this and would like to follow up if the blood work today is abnormal. She denies SOB and chest pain.   Multiple Joint Pain Has bilateral knee joint pain as well as right shoulder pain. She is taking tylenol and aleve ( x2 monthly). She has used voltaren gel in the past and still has some at home that she is open to trying again. She has an appointment with her doctor that she sees for her cutaneous lupus. She received knee injections pre COVID but stopped due to possible immunosuppression. She would like to try these again.   PERTINENT  PMH / PSH: DJD, HTN, Peripheral neuropathy, Cutaneous lupus erythematosus, HLD, Anxiety Depression (need new PHQ9 score not obtained during this visit)  OBJECTIVE:   BP 135/75   Pulse 93   Ht 5\' 3"  (1.6 m)   Wt 224 lb 9.6 oz (101.9 kg)   SpO2 94%   BMI 39.79 kg/m   General: Appears well, no acute distress. Age appropriate. Cardiac: RRR, normal heart sounds, no murmurs Respiratory: CTAB, normal effort Extremities: No edema or cyanosis.  ASSESSMENT/PLAN:   Osteoarthritis Multiple joints affected. Would like to resume hydrocortisone injections. I believe this is ok. Prior fear of immunosuppresion with the possibility of getting COVID. She is vaccinated x2. She will have appt. With doctor 7/22 and will speak to that provider for resuming injections. Can continue tylenol arthritis and aleve sparingly. Resume voltaren gel. Consider hydrotherapy PT in the future.   Fatigue Chronic. Non-specific symptoms. Normal TSH, vitamin b12, and vitamin d. She is continued on supplements. There may be chronic  pain association. Hgb is 11 which is stable and increased from her baseline of 10.3-10.8. Will consider iron studies in the future as iron deficiency anemia can be a source of fatigue. Will need to repeat PHQ 9 at subsequent visits for possible depression etiology. Unsure if possible underlying malignancy but least likely on my differential. Will need to explore for other symptoms and possibly repeat CMP. We will need several visits to attempt to improve this issue. Will follow up in 1 month.   Gerlene Fee, [place]

## 2019-08-06 NOTE — Patient Instructions (Signed)
It was very nice to see you today. Please enjoy the rest of your week. Today you were seen for fatigue and joint pain.  You can continue to take Tylenol arthritis as well as occasionally Aleve for joint pain.  Also try Voltaren gel.  You will follow-up with your lupus doctor on Wednesday to possibly get hydrocortisone injections.  I would like to know how things go.  I will call you if labs are normal otherwise I will communicate results via MyChart.   Please call the clinic at [phone] if your symptoms worsen or you have any concerns. It was our pleasure to serve you.

## 2019-08-07 LAB — VITAMIN D 25 HYDROXY (VIT D DEFICIENCY, FRACTURES): Vit D, 25-Hydroxy: 47.3 ng/mL (ref 30.0–100.0)

## 2019-08-07 LAB — CBC
Hematocrit: 34.4 % (ref 34.0–46.6)
Hemoglobin: 11 g/dL — ABNORMAL LOW (ref 11.1–15.9)
MCH: 27.9 pg (ref 26.6–33.0)
MCHC: 32 g/dL (ref 31.5–35.7)
MCV: 87 fL (ref 79–97)
Platelets: 196 10*3/uL (ref 150–450)
RBC: 3.94 x10E6/uL (ref 3.77–5.28)
RDW: 12.4 % (ref 11.7–15.4)
WBC: 5.2 10*3/uL (ref 3.4–10.8)

## 2019-08-07 LAB — VITAMIN B12: Vitamin B-12: 726 pg/mL (ref 232–1245)

## 2019-08-07 LAB — TSH: TSH: 0.781 u[IU]/mL (ref 0.450–4.500)

## 2019-08-08 ENCOUNTER — Other Ambulatory Visit: Payer: Self-pay | Admitting: Family Medicine

## 2019-08-08 DIAGNOSIS — E785 Hyperlipidemia, unspecified: Secondary | ICD-10-CM

## 2019-08-10 ENCOUNTER — Other Ambulatory Visit: Payer: Self-pay | Admitting: Family Medicine

## 2019-08-10 ENCOUNTER — Telehealth: Payer: Self-pay | Admitting: Family Medicine

## 2019-08-10 ENCOUNTER — Encounter: Payer: Self-pay | Admitting: Family Medicine

## 2019-08-10 DIAGNOSIS — E785 Hyperlipidemia, unspecified: Secondary | ICD-10-CM

## 2019-08-10 DIAGNOSIS — R5383 Other fatigue: Secondary | ICD-10-CM | POA: Insufficient documentation

## 2019-08-10 NOTE — Telephone Encounter (Signed)
Called to discuss results and follow up with patient. Her knees were injected Wednesday by lupus doctor and she is feeling better. Her hip and shoulder are also feeling better. She continues to be tired but feels this is due to her just waking up. She will follow up with me in 1 month for fatigue.   Juriel Cid Autry-Lott, DO 08/10/2019, 4:17 PM

## 2019-08-10 NOTE — Assessment & Plan Note (Signed)
Multiple joints affected. Would like to resume hydrocortisone injections. I believe this is ok. Prior fear of immunosuppresion with the possibility of getting COVID. She is vaccinated x2. She will have appt. With doctor 7/22 and will speak to that provider for resuming injections. Can continue tylenol arthritis and aleve sparingly. Resume voltaren gel. Consider hydrotherapy PT in the future.

## 2019-08-10 NOTE — Progress Notes (Addendum)
Additional PE Extremities: bilateral knee pain tender to palpation. No masses palpated. Negative Hawkins and Neers shoulder exam. Full ROM.

## 2019-08-10 NOTE — Assessment & Plan Note (Addendum)
Chronic. Non-specific symptoms. Normal TSH, vitamin b12, and vitamin d. She is continued on supplements. There may be chronic pain association. Hgb is 11 which is stable and increased from her baseline of 10.3-10.8. Will consider iron studies in the future as iron deficiency anemia can be a source of fatigue. Will need to repeat PHQ 9 at subsequent visits for possible depression etiology. Unsure if possible underlying malignancy but least likely on my differential. Will need to explore for other symptoms and possibly repeat CMP. We will need several visits to attempt to improve this issue. Will follow up in 1 month.

## 2019-08-21 ENCOUNTER — Other Ambulatory Visit: Payer: Self-pay | Admitting: Family Medicine

## 2019-08-21 DIAGNOSIS — E559 Vitamin D deficiency, unspecified: Secondary | ICD-10-CM

## 2019-08-24 ENCOUNTER — Telehealth: Payer: Self-pay

## 2019-08-24 NOTE — Telephone Encounter (Signed)
Patient calls nurse line in regards to Vitamin D prescription not being authorized. I am assuming because last Vitamin D check was WNL. Please advise.

## 2019-08-24 NOTE — Telephone Encounter (Signed)
Correct patient can continue with her normal otc vitamin d supplementation for maintenance. Thank you for your help with this patient.   [place], DO 08/24/2019, 11:09 AM PGY-2, [place]

## 2019-08-27 ENCOUNTER — Other Ambulatory Visit: Payer: Self-pay | Admitting: *Deleted

## 2019-08-27 DIAGNOSIS — E559 Vitamin D deficiency, unspecified: Secondary | ICD-10-CM

## 2019-09-06 NOTE — Telephone Encounter (Signed)
Patient contacted and informed of below. Patient agreeable to OTC vitamin d for maintenance.

## 2019-11-26 ENCOUNTER — Encounter: Payer: Self-pay | Admitting: Family Medicine

## 2019-11-26 ENCOUNTER — Other Ambulatory Visit: Payer: Self-pay

## 2019-11-26 ENCOUNTER — Ambulatory Visit (INDEPENDENT_AMBULATORY_CARE_PROVIDER_SITE_OTHER): Payer: Medicare Other | Admitting: Family Medicine

## 2019-11-26 VITALS — BP 138/70 | HR 78 | Wt 227.0 lb

## 2019-11-26 DIAGNOSIS — E559 Vitamin D deficiency, unspecified: Secondary | ICD-10-CM | POA: Diagnosis not present

## 2019-11-26 DIAGNOSIS — D510 Vitamin B12 deficiency anemia due to intrinsic factor deficiency: Secondary | ICD-10-CM | POA: Diagnosis not present

## 2019-11-26 DIAGNOSIS — J3489 Other specified disorders of nose and nasal sinuses: Secondary | ICD-10-CM

## 2019-11-26 DIAGNOSIS — Z23 Encounter for immunization: Secondary | ICD-10-CM | POA: Diagnosis not present

## 2019-11-26 DIAGNOSIS — Z1231 Encounter for screening mammogram for malignant neoplasm of breast: Secondary | ICD-10-CM | POA: Diagnosis not present

## 2019-11-26 DIAGNOSIS — R5383 Other fatigue: Secondary | ICD-10-CM

## 2019-11-26 NOTE — Patient Instructions (Signed)
It was wonderful to see you today.  Please bring ALL of your medications with you to every visit.   Today we talked about:  Fatigue. We discussed being more active. You wanted to repeat labs and I agreed. If anything is abnormal I will give you a call. If normal we will communicate via Mychart.   For the pressure in your face it is likely due to continue allergic rhinitis and your recent cold. Try flonase OTC along with your zyrtec.   Thank you for getting your flu shot.   Call the number on the breast center sheet to schedule your mammogram!  Please call the clinic at [phone] if your symptoms worsen or you have any concerns. It was our pleasure to serve you.  Dr. Janus Molder

## 2019-11-26 NOTE — Progress Notes (Signed)
    SUBJECTIVE:   CHIEF COMPLAINT / HPI:   Ms. Ohlrich is a 69 yo F who presents for the issues below.   Fatigue Continues to endorse continued fatigue. Would live her vitamin b 12 and vitamin d check again. She is taking her vitamins daily. Has not increased her activity level due to social fears with COVID prevalence. Will go out to stores during the day occasionally. She just feels as is she has very little energy. She goes to bed at 12 or 1 am and gets up around 8 pm. This is not an issues for her because she gets adequate sleep and has no problem with sleep. Denies recent weight loss. Has hot flashes but nothing out of her normal.   Facial pressure Recovering from a bad cold 1 week prior. Feels as if there is pressure or congestion in her nose and head. Has associated headache but denies any dizziness or vision changes. Using zyrtec that helps minimally. Denies mucus production or rhinorrhea. Open to trying flonase over the counter.   PERTINENT  PMH / PSH: Allergic rhinitis, Hot flashes, anxiety/depression, chronic knee pain, cold intolerance  OBJECTIVE:   BP 138/70   Pulse 78   Wt 227 lb (103 kg)   SpO2 94%   BMI 40.21 kg/m   General: Appears well, no acute distress. Age appropriate. Cardiac: RRR, normal heart sounds, no murmurs Respiratory: CTAB, normal effort  ASSESSMENT/PLAN:   Fatigue Chronic. No improvement form last visit. Prior hx of normal TSH. Hx of pernicious anemia and low vitamin d although last labs were wnl. She is on supplements for both. Will obtain iron studies for iron deficiency as an etiology. Obtain CMP per last visit although malignancy without redflags are low. Will send for mammogram for HM cancer screening. Encouraged patient to find activities during the day that stimulate her and will increase endorphins. Will stress PHQ 9 at follow up.    Sinus pressure Hx of allergic rhinitis. Recovering from a bad cold 1 week prior. Continues to endorse facial  pressure and occasional accompanied headache. Using daily zyrtec. Discussed the use of flonase and patient would like to get OTC.  -Start flonase -Follow up if symptoms worsen or fails to improve  Pernicious anemia - Anemia panel - Ferritin - Vitamin B12 lvl - continue OTC supplements  Hypovitaminosis D - Vitamin D, 25-hydroxy lvl - continue OTC supplements  Encounter for screening mammogram for breast cancer - MM Digital Screening; Future  Need for immunization against influenza - Flu Vaccine QUAD High Dose(Fluad)   Gerlene Fee, DO [place]

## 2019-11-28 DIAGNOSIS — J3489 Other specified disorders of nose and nasal sinuses: Secondary | ICD-10-CM | POA: Insufficient documentation

## 2019-11-28 NOTE — Assessment & Plan Note (Deleted)
Recovering from a bad cold 1 week prior. Continues to endorse facial pressure and occasional accompanied headache. Using daily zyrtec. Discussed the use of flonase and patient would like to get OTC.  -Start flonase -Follow up if symptoms worsen or fails to improve

## 2019-11-28 NOTE — Assessment & Plan Note (Addendum)
Hx of allergic rhinitis. Recovering from a bad cold 1 week prior. Continues to endorse facial pressure and occasional accompanied headache. Using daily zyrtec. Discussed the use of flonase and patient would like to get OTC.  -Start flonase -Follow up if symptoms worsen or fails to improve

## 2019-11-28 NOTE — Assessment & Plan Note (Addendum)
Chronic. No improvement form last visit. Prior hx of normal TSH. Hx of pernicious anemia and low vitamin d although last labs were wnl. She is on supplements for both. Will obtain iron studies for iron deficiency as an etiology. Obtain CMP per last visit although malignancy without redflags are low. Will send for mammogram for HM cancer screening. Encouraged patient to find activities during the day that stimulate her and will increase endorphins. Will stress PHQ 9 at follow up.

## 2019-11-30 LAB — COMPREHENSIVE METABOLIC PANEL
ALT: 38 IU/L — ABNORMAL HIGH (ref 0–32)
AST: 34 IU/L (ref 0–40)
Albumin/Globulin Ratio: 1.2 (ref 1.2–2.2)
Albumin: 4.2 g/dL (ref 3.7–4.7)
Alkaline Phosphatase: 78 IU/L (ref 44–121)
BUN/Creatinine Ratio: 13 (ref 12–28)
BUN: 15 mg/dL (ref 8–27)
Bilirubin Total: <0.2 mg/dL (ref 0.0–1.2)
CO2: 27 mmol/L (ref 20–29)
Calcium: 10 mg/dL (ref 8.7–10.3)
Chloride: 100 mmol/L (ref 96–106)
Creatinine, Ser: 1.16 mg/dL — ABNORMAL HIGH (ref 0.57–1.00)
GFR calc Af Amer: 55 mL/min/{1.73_m2} — ABNORMAL LOW (ref >59)
GFR calc non Af Amer: 47 mL/min/{1.73_m2} — ABNORMAL LOW (ref >59)
Globulin, Total: 3.5 g/dL (ref 1.5–4.5)
Glucose: 111 mg/dL — ABNORMAL HIGH (ref 65–99)
Potassium: 3.7 mmol/L (ref 3.5–5.2)
Sodium: 141 mmol/L (ref 134–144)
Total Protein: 7.7 g/dL (ref 6.0–8.5)

## 2019-11-30 LAB — ANEMIA PANEL
Ferritin: 239 ng/mL — ABNORMAL HIGH (ref 15–150)
Folate, Hemolysate: 305 ng/mL
Folate, RBC: 882 ng/mL (ref >498)
Hematocrit: 34.6 % (ref 34.0–46.6)
Iron Saturation: 20 % (ref 15–55)
Iron: 50 ug/dL (ref 27–139)
Retic Ct Pct: 1.2 % (ref 0.6–2.6)
Total Iron Binding Capacity: 244 ug/dL — ABNORMAL LOW (ref 250–450)
UIBC: 194 ug/dL (ref 118–369)
Vitamin B-12: 1130 pg/mL (ref 232–1245)

## 2019-11-30 LAB — VITAMIN D 25 HYDROXY (VIT D DEFICIENCY, FRACTURES): Vit D, 25-Hydroxy: 36.4 ng/mL (ref 30.0–100.0)

## 2019-12-11 ENCOUNTER — Telehealth: Payer: Self-pay | Admitting: Family Medicine

## 2019-12-11 NOTE — Telephone Encounter (Signed)
Called to discuss recent labwork. Patient aware via Mychart. Improved Vitamin d and b 12. Need to repeat CMP with next visit, non urgent.   Lorely Bubb Autry-Lott, DO 12/11/2019, 1:56 PM PGY-2, [place]

## 2019-12-22 ENCOUNTER — Other Ambulatory Visit: Payer: Medicare Other

## 2019-12-22 DIAGNOSIS — Z20822 Contact with and (suspected) exposure to covid-19: Secondary | ICD-10-CM

## 2019-12-24 LAB — NOVEL CORONAVIRUS, NAA: SARS-CoV-2, NAA: NOT DETECTED

## 2019-12-24 LAB — SARS-COV-2, NAA 2 DAY TAT

## 2020-01-04 DIAGNOSIS — Z20822 Contact with and (suspected) exposure to covid-19: Secondary | ICD-10-CM | POA: Diagnosis not present

## 2020-01-08 ENCOUNTER — Other Ambulatory Visit: Payer: Medicare Other

## 2020-01-08 DIAGNOSIS — Z20822 Contact with and (suspected) exposure to covid-19: Secondary | ICD-10-CM

## 2020-01-10 LAB — NOVEL CORONAVIRUS, NAA

## 2020-01-20 ENCOUNTER — Other Ambulatory Visit: Payer: Self-pay | Admitting: Family Medicine

## 2020-01-20 DIAGNOSIS — I10 Essential (primary) hypertension: Secondary | ICD-10-CM

## 2020-01-24 ENCOUNTER — Other Ambulatory Visit: Payer: Self-pay | Admitting: Family Medicine

## 2020-01-24 DIAGNOSIS — I10 Essential (primary) hypertension: Secondary | ICD-10-CM

## 2020-02-07 DIAGNOSIS — M25511 Pain in right shoulder: Secondary | ICD-10-CM | POA: Diagnosis not present

## 2020-02-07 DIAGNOSIS — D649 Anemia, unspecified: Secondary | ICD-10-CM | POA: Diagnosis not present

## 2020-02-07 DIAGNOSIS — H04129 Dry eye syndrome of unspecified lacrimal gland: Secondary | ICD-10-CM | POA: Diagnosis not present

## 2020-02-07 DIAGNOSIS — M25569 Pain in unspecified knee: Secondary | ICD-10-CM | POA: Diagnosis not present

## 2020-02-07 DIAGNOSIS — E669 Obesity, unspecified: Secondary | ICD-10-CM | POA: Diagnosis not present

## 2020-02-07 DIAGNOSIS — L932 Other local lupus erythematosus: Secondary | ICD-10-CM | POA: Diagnosis not present

## 2020-02-07 DIAGNOSIS — M3500 Sicca syndrome, unspecified: Secondary | ICD-10-CM | POA: Diagnosis not present

## 2020-02-07 DIAGNOSIS — M150 Primary generalized (osteo)arthritis: Secondary | ICD-10-CM | POA: Diagnosis not present

## 2020-02-25 ENCOUNTER — Other Ambulatory Visit: Payer: Self-pay | Admitting: Family Medicine

## 2020-02-25 DIAGNOSIS — E785 Hyperlipidemia, unspecified: Secondary | ICD-10-CM

## 2020-05-30 ENCOUNTER — Other Ambulatory Visit: Payer: Self-pay | Admitting: Family Medicine

## 2020-05-30 DIAGNOSIS — E785 Hyperlipidemia, unspecified: Secondary | ICD-10-CM

## 2020-07-22 ENCOUNTER — Other Ambulatory Visit: Payer: Self-pay | Admitting: Family Medicine

## 2020-07-22 DIAGNOSIS — I10 Essential (primary) hypertension: Secondary | ICD-10-CM

## 2020-08-06 DIAGNOSIS — E669 Obesity, unspecified: Secondary | ICD-10-CM | POA: Diagnosis not present

## 2020-08-06 DIAGNOSIS — M25562 Pain in left knee: Secondary | ICD-10-CM | POA: Diagnosis not present

## 2020-08-06 DIAGNOSIS — M150 Primary generalized (osteo)arthritis: Secondary | ICD-10-CM | POA: Diagnosis not present

## 2020-08-06 DIAGNOSIS — L932 Other local lupus erythematosus: Secondary | ICD-10-CM | POA: Diagnosis not present

## 2020-08-06 DIAGNOSIS — M3500 Sicca syndrome, unspecified: Secondary | ICD-10-CM | POA: Diagnosis not present

## 2020-08-06 DIAGNOSIS — M549 Dorsalgia, unspecified: Secondary | ICD-10-CM | POA: Diagnosis not present

## 2020-08-06 DIAGNOSIS — M545 Low back pain, unspecified: Secondary | ICD-10-CM | POA: Diagnosis not present

## 2020-08-06 DIAGNOSIS — M1990 Unspecified osteoarthritis, unspecified site: Secondary | ICD-10-CM | POA: Diagnosis not present

## 2020-08-06 DIAGNOSIS — D649 Anemia, unspecified: Secondary | ICD-10-CM | POA: Diagnosis not present

## 2020-08-20 ENCOUNTER — Telehealth: Payer: Self-pay

## 2020-08-20 NOTE — Telephone Encounter (Signed)
LVM to have pt call back to schedule AWV.   RE: confirm insurance and schedule AWV on my schedule if times are convenient for patient or other AWV schedule as template permits.   

## 2020-09-03 ENCOUNTER — Other Ambulatory Visit: Payer: Self-pay | Admitting: Family Medicine

## 2020-09-03 DIAGNOSIS — E785 Hyperlipidemia, unspecified: Secondary | ICD-10-CM

## 2020-09-08 ENCOUNTER — Telehealth: Payer: Self-pay

## 2020-09-08 NOTE — Telephone Encounter (Signed)
Placed call to patient to schedule Annual wellness visit. LVM

## 2020-09-23 ENCOUNTER — Other Ambulatory Visit: Payer: Self-pay

## 2020-09-23 ENCOUNTER — Encounter: Payer: Self-pay | Admitting: Family Medicine

## 2020-09-23 ENCOUNTER — Ambulatory Visit (INDEPENDENT_AMBULATORY_CARE_PROVIDER_SITE_OTHER): Payer: Medicare Other | Admitting: Family Medicine

## 2020-09-23 VITALS — BP 151/70 | HR 86 | Ht 63.0 in | Wt 220.2 lb

## 2020-09-23 DIAGNOSIS — D510 Vitamin B12 deficiency anemia due to intrinsic factor deficiency: Secondary | ICD-10-CM | POA: Diagnosis not present

## 2020-09-23 DIAGNOSIS — H8110 Benign paroxysmal vertigo, unspecified ear: Secondary | ICD-10-CM | POA: Diagnosis not present

## 2020-09-23 NOTE — Assessment & Plan Note (Signed)
Currently having increased fatigue, concerned that her levels may be low. Unable to collect CBC and B12 level today due to lab being full, patient agreeable to come back for future collection.  - CBC and B12 level

## 2020-09-23 NOTE — Progress Notes (Signed)
    SUBJECTIVE:   CHIEF COMPLAINT / HPI:   Vertigo For quite some time the patient has been dealing with dizziness. She has a history of vertigo in the past and previously underwent vestibular rehab therapy. She has the exercises that she previously did and has been doing them intermittently over the last 6 month.   Pernicious anemia Patient is also concerned that her blood counts may be lower than usual because she has felt more fatigue, cold, and is sleeping a lot lately. She does not feel that depression of any kind if contributing to this feeling.  PERTINENT  PMH / PSH: Reviewed  OBJECTIVE:   BP (!) 151/70   Pulse 86   Ht '5\' 3"'$  (1.6 m)   Wt 220 lb 4 oz (99.9 kg)   SpO2 100%   BMI 39.02 kg/m   General: NAD, well-appearing, well-nourished Respiratory: No respiratory distress, breathing comfortably, able to speak in full sentences Skin: warm and dry, no rashes noted on exposed skin Psych: Appropriate affect and mood  Neuro: CN II: PERRL CN III, IV,VI: EOMI CV V: Normal sensation in V1, V2, V3 CVII: Symmetric smile and brow raise CN VIII: Normal hearing CN IX,X: Symmetric palate raise  CN XI: 5/5 shoulder shrug CN XII: Symmetric tongue protrusion  UE and LE strength 5/5  ASSESSMENT/PLAN:   Pernicious anemia Currently having increased fatigue, concerned that her levels may be low. Unable to collect CBC and B12 level today due to lab being full, patient agreeable to come back for future collection.  - CBC and B12 level  Benign paroxysmal positional vertigo Patient unable to afford rehab visits due to out of pocket cost but still has the exercises. Encouraged to continue and increase the exercises for BPPV. Meclizine not preferred given patient's age. Patient is currently not driving due to the vertigo. - Vestibular rehab exercises - Return precautions given     Rise Patience, [place]

## 2020-09-23 NOTE — Patient Instructions (Signed)
I believe that you are having vertigo, I recommend continuing the exercises you had from the therapy before. The concern I have is if it is affecting your driving, if the symptoms become recurrent and severe it is always a risk to driving ability and something we may need to think about in the future.    We are also checking your labs for anemia today and will either call you or send a MyChart message regarding the results.   Benign Positional Vertigo Vertigo is the feeling that you or your surroundings are moving when they are not. Benign positional vertigo is the most common form of vertigo. This is usually a harmless condition (benign). This condition is positional. This means that symptoms are triggered by certain movements and positions. This condition can be dangerous if it occurs while you are doing something that could cause harm to yourself or others. This includes activities such as driving or operating machinery. What are the causes? The inner ear has fluid-filled canals that help your brain sense movement and balance. When the fluid moves, the brain receives messages about your body's position. With benign positional vertigo, calcium crystals in the inner ear break free and disturb the inner ear area. This causes your brain to receive confusing messages about your body's position. What increases the risk? You are more likely to develop this condition if: You are a woman. You are 20 years of age or older. You have recently had a head injury. You have an inner ear disease. What are the signs or symptoms? Symptoms of this condition usually happen when you move your head or your eyes in different directions. Symptoms may start suddenly and usually last for less than a minute. They include: Loss of balance and falling. Feeling like you are spinning or moving. Feeling like your surroundings are spinning or moving. Nausea and vomiting. Blurred vision. Dizziness. Involuntary eye  movement (nystagmus). Symptoms can be mild and cause only minor problems, or they can be severe and interfere with daily life. Episodes of benign positional vertigo may return (recur) over time. Symptoms may also improve over time. How is this diagnosed? This condition may be diagnosed based on: Your medical history. A physical exam of the head, neck, and ears. Positional tests to check for or stimulate vertigo. You may be asked to turn your head and change positions, such as going from sitting to lying down. A health care provider will watch for symptoms of vertigo. You may be referred to a health care provider who specializes in ear, nose, and throat problems (ENT or otolaryngologist) or a provider who specializes in disorders of the nervous system (neurologist). How is this treated? This condition may be treated in a session in which your health care provider moves your head in specific positions to help the displaced crystals in your inner ear move. Treatment for this condition may take several sessions. Surgery may be needed in severe cases, but this is rare. In some cases, benign positional vertigo may resolve on its own in 2-4 weeks. Follow these instructions at home: Safety Move slowly. Avoid sudden body or head movements or certain positions, as told by your health care provider. Avoid driving or operating machinery until your health care provider says it is safe. Avoid doing any tasks that would be dangerous to you or others if vertigo occurs. If you have trouble walking or keeping your balance, try using a cane for stability. If you feel dizzy or unstable, sit down right away.  Return to your normal activities as told by your health care provider. Ask your health care provider what activities are safe for you. General instructions Take over-the-counter and prescription medicines only as told by your health care provider. Drink enough fluid to keep your urine pale yellow. Keep all  follow-up visits. This is important. Contact a health care provider if: You have a fever. Your condition gets worse or you develop new symptoms. Your family or friends notice any behavioral changes. You have nausea or vomiting that gets worse. You have numbness or a prickling and tingling sensation. Get help right away if you: Have difficulty speaking or moving. Are always dizzy or faint. Develop severe headaches. Have weakness in your legs or arms. Have changes in your hearing or vision. Develop a stiff neck. Develop sensitivity to light. These symptoms may represent a serious problem that is an emergency. Do not wait to see if the symptoms will go away. Get medical help right away. Call your local emergency services (911 in the [place] Do not drive yourself to the hospital. Summary Vertigo is the feeling that you or your surroundings are moving when they are not. Benign positional vertigo is the most common form of vertigo. This condition is caused by calcium crystals in the inner ear that become displaced. This causes a disturbance in an area of the inner ear that helps your brain sense movement and balance. Symptoms include loss of balance and falling, feeling that you or your surroundings are moving, nausea and vomiting, and blurred vision. This condition can be diagnosed based on symptoms, a physical exam, and positional tests. Follow safety instructions as told by your health care provider and keep all follow-up visits. This is important. This information is not intended to replace advice given to you by your health care provider. Make sure you discuss any questions you have with your health care provider. Document Revised: 12/05/2019 Document Reviewed: 12/05/2019 Elsevier Patient Education  2022 [employer].

## 2020-09-23 NOTE — Assessment & Plan Note (Signed)
Patient unable to afford rehab visits due to out of pocket cost but still has the exercises. Encouraged to continue and increase the exercises for BPPV. Meclizine not preferred given patient's age. Patient is currently not driving due to the vertigo. - Vestibular rehab exercises - Return precautions given

## 2020-09-29 ENCOUNTER — Other Ambulatory Visit: Payer: Medicare Other

## 2020-09-29 ENCOUNTER — Other Ambulatory Visit: Payer: Self-pay

## 2020-09-29 DIAGNOSIS — D510 Vitamin B12 deficiency anemia due to intrinsic factor deficiency: Secondary | ICD-10-CM

## 2020-09-30 LAB — CBC
Hematocrit: 33.8 % — ABNORMAL LOW (ref 34.0–46.6)
Hemoglobin: 10.8 g/dL — ABNORMAL LOW (ref 11.1–15.9)
MCH: 27.6 pg (ref 26.6–33.0)
MCHC: 32 g/dL (ref 31.5–35.7)
MCV: 86 fL (ref 79–97)
Platelets: 168 10*3/uL (ref 150–450)
RBC: 3.91 x10E6/uL (ref 3.77–5.28)
RDW: 12.3 % (ref 11.7–15.4)
WBC: 6.7 10*3/uL (ref 3.4–10.8)

## 2020-09-30 LAB — VITAMIN B12: Vitamin B-12: 260 pg/mL (ref 232–1245)

## 2020-10-04 ENCOUNTER — Other Ambulatory Visit: Payer: Self-pay

## 2020-10-04 ENCOUNTER — Inpatient Hospital Stay (HOSPITAL_COMMUNITY)
Admission: EM | Admit: 2020-10-04 | Discharge: 2020-10-06 | DRG: 394 | Disposition: A | Discharge status: Home / Self Care | Payer: Medicare Other | Attending: Family Medicine | Admitting: Family Medicine

## 2020-10-04 ENCOUNTER — Emergency Department (HOSPITAL_COMMUNITY): Payer: Medicare Other

## 2020-10-04 ENCOUNTER — Encounter (HOSPITAL_COMMUNITY): Payer: Self-pay

## 2020-10-04 DIAGNOSIS — R7303 Prediabetes: Secondary | ICD-10-CM | POA: Diagnosis present

## 2020-10-04 DIAGNOSIS — E669 Obesity, unspecified: Secondary | ICD-10-CM | POA: Diagnosis present

## 2020-10-04 DIAGNOSIS — K449 Diaphragmatic hernia without obstruction or gangrene: Secondary | ICD-10-CM | POA: Diagnosis not present

## 2020-10-04 DIAGNOSIS — R109 Unspecified abdominal pain: Secondary | ICD-10-CM | POA: Diagnosis not present

## 2020-10-04 DIAGNOSIS — M25562 Pain in left knee: Secondary | ICD-10-CM | POA: Diagnosis present

## 2020-10-04 DIAGNOSIS — K559 Vascular disorder of intestine, unspecified: Secondary | ICD-10-CM | POA: Diagnosis present

## 2020-10-04 DIAGNOSIS — N179 Acute kidney failure, unspecified: Secondary | ICD-10-CM | POA: Diagnosis present

## 2020-10-04 DIAGNOSIS — K529 Noninfective gastroenteritis and colitis, unspecified: Secondary | ICD-10-CM | POA: Diagnosis not present

## 2020-10-04 DIAGNOSIS — R102 Pelvic and perineal pain: Secondary | ICD-10-CM | POA: Diagnosis not present

## 2020-10-04 DIAGNOSIS — G8929 Other chronic pain: Secondary | ICD-10-CM | POA: Diagnosis present

## 2020-10-04 DIAGNOSIS — E538 Deficiency of other specified B group vitamins: Secondary | ICD-10-CM | POA: Diagnosis present

## 2020-10-04 DIAGNOSIS — Z7982 Long term (current) use of aspirin: Secondary | ICD-10-CM

## 2020-10-04 DIAGNOSIS — Z20822 Contact with and (suspected) exposure to covid-19: Secondary | ICD-10-CM | POA: Diagnosis present

## 2020-10-04 DIAGNOSIS — E785 Hyperlipidemia, unspecified: Secondary | ICD-10-CM | POA: Diagnosis present

## 2020-10-04 DIAGNOSIS — K625 Hemorrhage of anus and rectum: Secondary | ICD-10-CM

## 2020-10-04 DIAGNOSIS — Z79899 Other long term (current) drug therapy: Secondary | ICD-10-CM | POA: Diagnosis not present

## 2020-10-04 DIAGNOSIS — K6389 Other specified diseases of intestine: Secondary | ICD-10-CM | POA: Diagnosis not present

## 2020-10-04 DIAGNOSIS — F329 Major depressive disorder, single episode, unspecified: Secondary | ICD-10-CM | POA: Diagnosis present

## 2020-10-04 DIAGNOSIS — R011 Cardiac murmur, unspecified: Secondary | ICD-10-CM | POA: Diagnosis present

## 2020-10-04 DIAGNOSIS — K219 Gastro-esophageal reflux disease without esophagitis: Secondary | ICD-10-CM | POA: Diagnosis present

## 2020-10-04 DIAGNOSIS — M25561 Pain in right knee: Secondary | ICD-10-CM | POA: Diagnosis present

## 2020-10-04 DIAGNOSIS — R1032 Left lower quadrant pain: Secondary | ICD-10-CM | POA: Diagnosis not present

## 2020-10-04 DIAGNOSIS — J302 Other seasonal allergic rhinitis: Secondary | ICD-10-CM | POA: Diagnosis present

## 2020-10-04 DIAGNOSIS — I10 Essential (primary) hypertension: Secondary | ICD-10-CM | POA: Diagnosis present

## 2020-10-04 DIAGNOSIS — F419 Anxiety disorder, unspecified: Secondary | ICD-10-CM | POA: Diagnosis present

## 2020-10-04 DIAGNOSIS — R739 Hyperglycemia, unspecified: Secondary | ICD-10-CM | POA: Diagnosis present

## 2020-10-04 DIAGNOSIS — D649 Anemia, unspecified: Secondary | ICD-10-CM | POA: Diagnosis present

## 2020-10-04 LAB — URINALYSIS, MICROSCOPIC (REFLEX): Bacteria, UA: NONE SEEN

## 2020-10-04 LAB — URINALYSIS, ROUTINE W REFLEX MICROSCOPIC
Bilirubin Urine: NEGATIVE
Glucose, UA: NEGATIVE mg/dL
Hgb urine dipstick: NEGATIVE
Ketones, ur: NEGATIVE mg/dL
Leukocytes,Ua: NEGATIVE
Nitrite: NEGATIVE
Protein, ur: 30 mg/dL — AB
Specific Gravity, Urine: 1.025 (ref 1.005–1.030)
pH: 5.5 (ref 5.0–8.0)

## 2020-10-04 LAB — COMPREHENSIVE METABOLIC PANEL
ALT: 23 U/L (ref 0–44)
AST: 26 U/L (ref 15–41)
Albumin: 3.7 g/dL (ref 3.5–5.0)
Alkaline Phosphatase: 66 U/L (ref 38–126)
Anion gap: 11 (ref 5–15)
BUN: 21 mg/dL (ref 8–23)
CO2: 26 mmol/L (ref 22–32)
Calcium: 9.8 mg/dL (ref 8.9–10.3)
Chloride: 99 mmol/L (ref 98–111)
Creatinine, Ser: 1.43 mg/dL — ABNORMAL HIGH (ref 0.44–1.00)
GFR, Estimated: 39 mL/min — ABNORMAL LOW (ref >60)
Glucose, Bld: 197 mg/dL — ABNORMAL HIGH (ref 70–99)
Potassium: 4.2 mmol/L (ref 3.5–5.1)
Sodium: 136 mmol/L (ref 135–145)
Total Bilirubin: 0.5 mg/dL (ref 0.3–1.2)
Total Protein: 7.7 g/dL (ref 6.5–8.1)

## 2020-10-04 LAB — IRON AND TIBC
Iron: 27 ug/dL — ABNORMAL LOW (ref 28–170)
Saturation Ratios: 11 % (ref 10.4–31.8)
TIBC: 252 ug/dL (ref 250–450)
UIBC: 225 ug/dL

## 2020-10-04 LAB — CBC
HCT: 36.2 % (ref 36.0–46.0)
Hemoglobin: 10.9 g/dL — ABNORMAL LOW (ref 12.0–15.0)
MCH: 27.7 pg (ref 26.0–34.0)
MCHC: 30.1 g/dL (ref 30.0–36.0)
MCV: 91.9 fL (ref 80.0–100.0)
Platelets: 213 10*3/uL (ref 150–400)
RBC: 3.94 MIL/uL (ref 3.87–5.11)
RDW: 13.2 % (ref 11.5–15.5)
WBC: 8.2 10*3/uL (ref 4.0–10.5)
nRBC: 0 % (ref 0.0–0.2)

## 2020-10-04 LAB — SARS CORONAVIRUS 2 (TAT 6-24 HRS): SARS Coronavirus 2: NEGATIVE

## 2020-10-04 LAB — LIPASE, BLOOD: Lipase: 43 U/L (ref 11–51)

## 2020-10-04 LAB — FERRITIN: Ferritin: 89 ng/mL (ref 11–307)

## 2020-10-04 MED ORDER — SIMVASTATIN 20 MG PO TABS
40.0000 mg | ORAL_TABLET | Freq: Every day | ORAL | Status: DC
  Administered 2020-10-04 – 2020-10-05 (×2): 40 mg via ORAL
  Filled 2020-10-04 (×2): qty 2

## 2020-10-04 MED ORDER — ACETAMINOPHEN 500 MG PO TABS
1000.0000 mg | ORAL_TABLET | Freq: Three times a day (TID) | ORAL | Status: DC | PRN
  Filled 2020-10-04: qty 2

## 2020-10-04 MED ORDER — METRONIDAZOLE 500 MG/100ML IV SOLN
500.0000 mg | Freq: Once | INTRAVENOUS | Status: AC
  Administered 2020-10-04: 500 mg via INTRAVENOUS
  Filled 2020-10-04: qty 100

## 2020-10-04 MED ORDER — CIPROFLOXACIN IN D5W 400 MG/200ML IV SOLN
400.0000 mg | Freq: Once | INTRAVENOUS | Status: AC
  Administered 2020-10-04: 400 mg via INTRAVENOUS
  Filled 2020-10-04: qty 200

## 2020-10-04 MED ORDER — SODIUM CHLORIDE 0.9 % IV SOLN: INTRAVENOUS | Status: DC

## 2020-10-04 MED ORDER — POLYETHYLENE GLYCOL 3350 17 G PO PACK
17.0000 g | PACK | Freq: Every day | ORAL | Status: DC
  Administered 2020-10-05 – 2020-10-06 (×2): 17 g via ORAL
  Filled 2020-10-04 (×2): qty 1

## 2020-10-04 MED ORDER — SODIUM CHLORIDE 0.9 % IV BOLUS
1000.0000 mL | Freq: Once | INTRAVENOUS | Status: AC
  Administered 2020-10-04: 1000 mL via INTRAVENOUS

## 2020-10-04 MED ORDER — POLYETHYLENE GLYCOL 3350 17 G PO PACK: 17.0000 g | PACK | Freq: Every day | ORAL | Status: DC

## 2020-10-04 NOTE — ED Notes (Signed)
Pt observed HR ranging from 42-49 bpm. EKG done and shown to Dr.Ray. Pt denies any cardiac complaints. Alert and oriented x 4.

## 2020-10-04 NOTE — ED Triage Notes (Signed)
Pt c/o lower abdominal pain, cold sweat, nausea, and bloody stools.

## 2020-10-04 NOTE — ED Notes (Signed)
I do not see a Covid test for this patient. Has it been ordered?

## 2020-10-04 NOTE — ED Provider Notes (Signed)
[facility] EMERGENCY DEPARTMENT Provider Note   CSN: [id number] Arrival date & time: 10/04/20  0125     History Chief Complaint  Patient presents with   Abdominal Pain    Jasmine Gregory is a 70 y.o. female.  HPI 70 year old female history of diverticulosis, hemorrhoids, GERD, hypertension, hyperlipidemia presents today complaining of left lower quadrant pain to groin that began last night around 6 PM.  It was severe at 10 out of 10 for several hours.  It was crampy in nature.  She has had associated nausea, vomiting, and loose stools with one bloody stool.  She has not noted any urinary tract infection symptoms.  She felt that she was hot but did not take her temperature and then had some chills afterwards.  She denies any urinary symptoms.  She has not had similar episode in the past.  She has no known diagnosis of diverticulitis, kidney stones,     Past Medical History:  Diagnosis Date   Anemia    Anxiety    Arthritis    Depression    Diverticulosis    Environmental and seasonal allergies    GERD (gastroesophageal reflux disease)    Hemorrhoids    History of blood transfusion    Hyperlipidemia    Hypertension    Hypovitaminosis D 07/20/2011   Vitamin D was 17 in May 2012. Patient has not taken any Vitamin D supplement since that time.      Murmur, heart    Obesity    Vitamin B 12 deficiency 07/20/2011   Vitamin B12 was low at 169 in May 2012. No evidence of supplementation since that time.     Patient Active Problem List   Diagnosis Date Noted   Sinus pressure 11/28/2019   Fatigue 08/10/2019   Cold intolerance 12/29/2018   Pain in joint, shoulder region 02/16/2018   Chronic pain of both knees 10/06/2016   Hot flashes 04/04/2015   Polyarthropathy 12/20/2011   Cutaneous lupus erythematosus 11/11/2011   Hypovitaminosis D 07/20/2011   Benign paroxysmal positional vertigo 01/29/2011   Peripheral neuropathy 06/12/2010   Allergic rhinitis 10/27/2009    Pernicious anemia 05/20/2009   OBESITY 05/26/2006   Anxiety and depression 05/26/2006   HYPERTENSION, BENIGN ESSENTIAL 05/26/2006   HLD (hyperlipidemia) 03/17/2006   GASTROESOPHAGEAL REFLUX, NO ESOPHAGITIS 03/17/2006    Past Surgical History:  Procedure Laterality Date   NO PAST SURGERIES       OB History   No obstetric history on file.     Family History  Problem Relation Age of Onset   Melanoma Mother    Breast cancer Maternal Aunt    Prostate cancer Maternal Uncle        x 2   Esophageal cancer Neg Hx    Pancreatic cancer Neg Hx    Colon cancer Neg Hx    Stomach cancer Neg Hx    Liver disease Neg Hx     Social History   Tobacco Use   Smoking status: Never   Smokeless tobacco: Never  Vaping Use   Vaping Use: Never used  Substance Use Topics   Alcohol use: No   Drug use: No    Home Medications Prior to Admission medications   Medication Sig Start Date End Date Taking? Authorizing Provider  acetaminophen (TYLENOL) 500 MG tablet Take 1 tablet (500 mg total) by mouth 3 (three) times daily. Patient taking differently: Take 500 mg by mouth See admin instructions. Take 1 tablet (500 mg) by  mouth every morning, may also take 1 tablet (500 mg) every 6 hours as needed for pain 10/06/16   Everrett Coombe, MD  cetirizine (ZYRTEC) 10 MG tablet Take 1 tablet (10 mg total) by mouth daily as needed. Patient taking differently: Take 10 mg by mouth daily as needed for allergies (congestion).  10/06/16   Everrett Coombe, MD  Cholecalciferol (VITAMIN D PO) Take 1 tablet by mouth daily.    [provider]  cyclobenzaprine (FLEXERIL) 10 MG tablet Take 1 tablet (10 mg total) by mouth 2 (two) times daily as needed for muscle spasms. 11/05/16   Charlann Lange, PA-C  fluticasone (FLONASE) 50 MCG/ACT nasal spray Place 2 sprays into both nostrils daily. To both nostrils Patient taking differently: Place 2 sprays into both nostrils daily as needed (sinus congestion). To both nostrils   10/06/16   Everrett Coombe, MD  gabapentin (NEURONTIN) 600 MG tablet Take 1 tablet (600 mg total) by mouth daily as needed (leg pain). 02/21/18   Everrett Coombe, MD  lisinopril-hydrochlorothiazide (ZESTORETIC) 20-12.5 MG tablet Take 2 tablets by mouth once daily 07/24/20   Autry-Lott, Naaman Plummer, DO  meclizine (ANTIVERT) 12.5 MG tablet Take 1 tablet (12.5 mg total) by mouth 3 (three) times daily as needed for dizziness. 03/14/18   Everrett Coombe, MD  PARoxetine (PAXIL) 40 MG tablet Take 1 tablet (40 mg total) by mouth daily. 02/21/18   Everrett Coombe, MD  ranitidine (ZANTAC) 150 MG tablet Take 1 tablet (150 mg total) by mouth daily. 30 minutes before dinner 12/10/15   Zehr, Janett Billow D, PA-C  simvastatin (ZOCOR) 40 MG tablet TAKE 1 TABLET BY MOUTH AT BEDTIME 09/04/20   Autry-Lott, Naaman Plummer, DO  Tetrahydrozoline HCl (VISINE OP) Place 1 drop into the right eye daily as needed (dry eyes/ irritation).    [provider]  Vitamin D, Ergocalciferol, (DRISDOL) 1.25 MG (50000 UNIT) CAPS capsule Take 1 capsule (50,000 Units total) by mouth every 7 (seven) days. 04/17/19   Autry-Lott, Naaman Plummer, DO    Allergies    Patient has no known allergies.  Review of Systems   Review of Systems  Constitutional:  Positive for chills and fever.  HENT: Negative.    Eyes: Negative.   Respiratory: Negative.    Cardiovascular: Negative.   Gastrointestinal:  Positive for abdominal pain.  Endocrine: Negative.   Genitourinary: Negative.   Musculoskeletal: Negative.   Skin: Negative.   Hematological: Negative.   Psychiatric/Behavioral: Negative.    All other systems reviewed and are negative.  Physical Exam Updated Vital Signs BP (!) 155/60   Pulse 76   Temp 99.1 F (37.3 C) (Oral)   Resp 16   SpO2 99%   Physical Exam Vitals and nursing note reviewed.  Constitutional:      Appearance: She is well-developed.  HENT:     Head: Normocephalic.     Mouth/Throat:     Mouth: Mucous membranes are moist.     Pharynx: Oropharynx is  clear.  Cardiovascular:     Rate and Rhythm: Normal rate and regular rhythm.  Abdominal:     General: Abdomen is flat. Bowel sounds are normal.     Palpations: Abdomen is soft.     Tenderness: There is abdominal tenderness in the left lower quadrant.  Skin:    General: Skin is warm and dry.     Capillary Refill: Capillary refill takes less than 2 seconds.  Neurological:     General: No focal deficit present.     Mental Status: She is  alert.  Psychiatric:        Mood and Affect: Mood normal.        Behavior: Behavior normal.    ED Results / Procedures / Treatments   Labs (all labs ordered are listed, but only abnormal results are displayed) Labs Reviewed  COMPREHENSIVE METABOLIC PANEL - Abnormal; Notable for the following components:      Result Value   Glucose, Bld 197 (*)    Creatinine, Ser 1.43 (*)    GFR, Estimated 39 (*)    All other components within normal limits  CBC - Abnormal; Notable for the following components:   Hemoglobin 10.9 (*)    All other components within normal limits  LIPASE, BLOOD  URINALYSIS, ROUTINE W REFLEX MICROSCOPIC    EKG None  Radiology CT ABDOMEN PELVIS WO CONTRAST  Result Date: 10/04/2020 CLINICAL DATA:  70 year old female with acute LEFT abdominal and pelvic pain. EXAM: CT ABDOMEN AND PELVIS WITHOUT CONTRAST TECHNIQUE: Multidetector CT imaging of the abdomen and pelvis was performed following the standard protocol without IV contrast. COMPARISON:  None. FINDINGS: Please note that parenchymal abnormalities may be missed without intravenous contrast. Lower chest: No acute abnormality Hepatobiliary: Mild hepatic steatosis noted. No other liver or gallbladder abnormality identified. No biliary dilatation. Pancreas: Unremarkable Spleen: Unremarkable Adrenals/Urinary Tract: The kidneys, adrenal glands and bladder are unremarkable except for a RIGHT renal cyst. Stomach/Bowel: Diffuse circumferential wall thickening of the descending colon is noted  with mild adjacent inflammation. Scattered diverticula noted throughout the sigmoid colon. There is no evidence of bowel obstruction, pneumoperitoneum or focal collection. The appendix is normal. A small hiatal hernia is identified. Vascular/Lymphatic: Aortic atherosclerosis. No enlarged abdominal or pelvic lymph nodes. Reproductive: Status post hysterectomy. No adnexal masses. Other: No ascites. Musculoskeletal: No acute abnormality or suspicious focal bony lesions. Degenerative disc disease and facet arthropathy throughout the lumbar spine identified. IMPRESSION: 1. Diffuse circumferential wall thickening of the descending colon with mild adjacent inflammation compatible with colitis. No evidence of bowel obstruction, pneumoperitoneum or focal collection. 2. Mild hepatic steatosis. 3. Small hiatal hernia. 4. Aortic Atherosclerosis (ICD10-I70.0). Electronically Signed   By: Margarette Canada M.D.   On: 10/04/2020 10:08    Procedures Procedures   Medications Ordered in ED Medications - No data to display  ED Course  I have reviewed the triage vital signs and the nursing notes.  Pertinent labs & imaging results that were available during my care of the patient were reviewed by me and considered in my medical decision making (see chart for details).  Clinical Course as of 10/04/20 1059  Sat Oct 04, 2020  [id number] Labs reviewed and interpreted with hyperglycemia glucose 197 but no evidence of DKA Creatinine elevated at 1.43 [DR]  0857 Creatinine(!): 1.43 [DR]  0857 Calcium: 9.8 [DR]  0857 Acute kidney injury with creatinine increased to 1.43 from last creatinine of 1.16 [DR]  0857 Hemoglobin is low at 10.9 [DR]  0903 Hemoglobin(!): 10.9 [DR]  0904 First prior hemoglobin was 10.8 several days ago and was eleven 1 year ago so this not appear to show any significant new anemia and patient does not appear to be symptomatic [DR]    Clinical Course User Index [DR] Pattricia Boss, MD   MDM  Rules/Calculators/A&P                          Patient with llq abdominal pain DDX Diverticulitis- known diverticulosis- crampy type pain and bleeding c.w. diverticulitis.  CT  pending Urinary colic-doubt based on urinalysis and ct UTI-doubt based on ua Ovarian/uterine etiology-no evidence of abnormality on ct Other intraabdominal or pelvic etiology CT c.w. colitis- plan iv fluids and abx Patient also had an episode of rectal bleeding likely secondary to the colitis Discussed with FP resident and will see for admission Final Clinical Impression(s) / ED Diagnoses Final diagnoses:  Abdominal pain, unspecified abdominal location  Colitis  AKI (acute kidney injury) ([place])  Rectal bleeding    Rx / DC Orders ED Discharge Orders     None        Pattricia Boss, MD 10/04/20 1135

## 2020-10-04 NOTE — H&P (Addendum)
[facility] Admission History and Physical Service Pager: [phone]  Patient name: Jasmine Gregory Medical record number: [id number] Date of birth: 06/13/1950 Age: 70 y.o. Gender: female  Primary Care Provider: Gerlene Fee, DO Consultants: none Code Status: Full code  Preferred Emergency Contact: Sharion Dove [phone]  Chief Complaint: Abdominal pain, with nausea, vomiting and bloody stools  Assessment and Plan: Jasmine Gregory is a 70 y.o. female presenting with left lower quadrant, associated with nausea, vomiting, loose stools - one bloody. PMH is significant for diverticulosis, hemorrhoids, GERD, hypertension, hyperlipidemia, MDD/GAD, lupus.  Abdominal pain concerning for colitis Patient presents with lower left quadrant pain associated with nausea vomiting and loose stools 1 bloody.  Labs are remarkable for an elevated creatinine at 1.43 elevated blood glucose of 197, and unremarkable urinalysis, with an unremarkable iron, ferritin and TIBC demonstrating no concern for iron deficiency anemia.  CT abdomen significant for diffuse circumferential wall thickening of the descending colon with mild adjacent inflammation, compatible with colitis.  She has no evidence of bowel obstruction pneumoperitoneum or focal collection on CT imaging.  Physical exam was notable for mild lower left quadrant abdominal tenderness, and increases intermittently.  Differential diagnosis includes colitis given CT imaging exam and history. Etiology of colitis undetermined at this time, but less likely to be ischemic colitis given mild pain on physical exam and CBC unremarkable for anemia.  White blood cell count unremarkable but infectious colitis still on differential. Differential also includes diverticulitis, renal stones or ovarian torsion, all less likely given CT imaging, and characteristics of pain. Patient also has a history of hemorrhoids, and may be the etiology of  bleeding. Patient has not had recent antibiotic exposure, low concern for c. Diff. Patient could have IBS as these changes seem to be intermittently chronic, concerning for constipation given likely straining. Admit for IV hydration and antibiotics.  -Admit to [facility], Attending Dr. Andria Frames -Iron studies -GI panel -FOBT -UA -Ucx -CBC -CMP -Lipase -Ciprofloxacin 400 mg x1 -Metronidazole 500 mg q 12 -narrow antibiotics as able -miralax daily -1L NaCl bolus -Clear liquid diet, advance as tolerated -Enteric precautions -PT/OT  AKI Patient reports not having p.o. intake for 1 day, creatinine on admission 1.43, baseline creatinine 1.01-1.16 per chart review.  Patient received 1 L normal saline bolus on admission. -mIVF 125 mL/hr -NS bolus  Hypertension BP on admission 171/70 but seem to improve. Home medications include lisinopril-HCTZ. -hold home med given AKI -monitor BP -restart home antihypertensive when able   HLD -Lipid Panel -continue simvastatin 40 mg  Elevated Glucose Patient blood glucose 197 on admission -Get A1C  SLE No rashes or new joint pain noted. See's rheumotologist outpatient every 6 months.  Anxiety/Depression Patient has history of anxiety and depression, not on any medications. She expresses interest in starting anxiety medication  Benign paroxysmal positional vertigo Patient with chronic history of vertigo, recently seen in clinic. Not on medication -continue Vestibular rehab exercises   FEN/GI: Clears diet, advance as tolerated  Prophylaxis: SCDs  Disposition: Admit to [facility], Attending Dr. Andria Frames  History of Present Illness:  Jasmine Gregory is a 70 y.o. female presenting with abdominal pain, nausea, vomiting, diarrhea and bloody stools x1. She is endorsing pain within the groin / lower left abdominal area, that starting yesterday evening. It was not initiated by trauma or injury. She has also been having nausea that occurs intermittently, and  diarrhea. She denies fever, but got very hot while using the restroom and started to feel lightheaded  and diaphoretic. During this episode she had fans on and drank water until she felt better which lasted about 5-10 minutes. Her last BM was yesterday, good form and consistency but later had runny stools twice yesterday. Her pain improved after a bowel movement, transitioning from being steadily consistent to being intermittent. After bowel movement, patient noticed blood in underwear and when she wiped, but no blood on the stool or in the toilet bowl. Blood was bright red, and she noticed a good amount of blood. She reports a similar bleeding episode years ago but only once. She reports having a BM daily to every other day, but sometimes strains, about 1-2 times a week. Stool form seems to be of pebble consistency from time to time. She was told that she has hemorrhoids about a year ago. Has never seen a GI specialist outpatient. She denies any new fatigue or weakness. Patient also complains of emesis x3 which was her food that she ate earlier. She denies dysuria, had no sick contacts and denies any recent illness, although she does appreciate a dry cough. Patient tried cigarettes as a teen but has gone over 50 years without smoking. She denies any alcohol or illicit drug use. Patient has occasional dizziness which is attributed to her chronic vertigo. She denies vision changes, dyspnea, chest pain, or palpitations. Patient denies abdominal surgeries, but had hysterectomy about 40 years ago. She also denies a family history of UC or Crohn's disease.   Review Of Systems: Per HPI with the following additions:   Review of Systems  Constitutional:  Positive for diaphoresis. Negative for appetite change, chills and fever.  HENT:  Positive for sinus pressure. Negative for congestion.   Respiratory:  Positive for cough.   Cardiovascular:  Negative for chest pain.  Gastrointestinal:  Positive for abdominal pain,  anal bleeding, blood in stool, constipation, diarrhea, nausea and vomiting. Negative for abdominal distention.  Genitourinary:  Negative for difficulty urinating, dysuria, flank pain and hematuria.  Musculoskeletal:  Positive for arthralgias. Negative for myalgias.  Skin:  Positive for rash.  Neurological:  Positive for dizziness.    Patient Active Problem List   Diagnosis Date Noted   Sinus pressure 11/28/2019   Fatigue 08/10/2019   Cold intolerance 12/29/2018   Pain in joint, shoulder region 02/16/2018   Chronic pain of both knees 10/06/2016   Hot flashes 04/04/2015   Polyarthropathy 12/20/2011   Cutaneous lupus erythematosus 11/11/2011   Hypovitaminosis D 07/20/2011   Benign paroxysmal positional vertigo 01/29/2011   Peripheral neuropathy 06/12/2010   Allergic rhinitis 10/27/2009   Pernicious anemia 05/20/2009   OBESITY 05/26/2006   Anxiety and depression 05/26/2006   HYPERTENSION, BENIGN ESSENTIAL 05/26/2006   HLD (hyperlipidemia) 03/17/2006   GASTROESOPHAGEAL REFLUX, NO ESOPHAGITIS 03/17/2006    Past Medical History: Past Medical History:  Diagnosis Date   Anemia    Anxiety    Arthritis    Depression    Diverticulosis    Environmental and seasonal allergies    GERD (gastroesophageal reflux disease)    Hemorrhoids    History of blood transfusion    Hyperlipidemia    Hypertension    Hypovitaminosis D 07/20/2011   Vitamin D was 17 in May 2012. Patient has not taken any Vitamin D supplement since that time.      Murmur, heart    Obesity    Vitamin B 12 deficiency 07/20/2011   Vitamin B12 was low at 169 in May 2012. No evidence of supplementation since that time.  Past Surgical History: Past Surgical History:  Procedure Laterality Date   NO PAST SURGERIES      Social History: Social History   Tobacco Use   Smoking status: Never   Smokeless tobacco: Never  Vaping Use   Vaping Use: Never used  Substance Use Topics   Alcohol use: No   Drug use: No    Additional social history: None  Please also refer to relevant sections of EMR.  Family History: Family History  Problem Relation Age of Onset   Melanoma Mother    Breast cancer Maternal Aunt    Prostate cancer Maternal Uncle        x 2   Esophageal cancer Neg Hx    Pancreatic cancer Neg Hx    Colon cancer Neg Hx    Stomach cancer Neg Hx    Liver disease Neg Hx      Allergies and Medications: No Known Allergies No current facility-administered medications on file prior to encounter.   Current Outpatient Medications on File Prior to Encounter  Medication Sig Dispense Refill   acetaminophen (TYLENOL) 500 MG tablet Take 1 tablet (500 mg total) by mouth 3 (three) times daily. (Patient taking differently: Take 500 mg by mouth See admin instructions. Take 1 tablet (500 mg) by mouth every morning, may also take 1 tablet (500 mg) every 6 hours as needed for pain) 90 tablet 0   aspirin EC 81 MG tablet Take 81 mg by mouth daily. Swallow whole.     cetirizine (ZYRTEC) 10 MG tablet Take 1 tablet (10 mg total) by mouth daily as needed. (Patient taking differently: Take 10 mg by mouth daily as needed for allergies (congestion).) 30 tablet 0   Chlorphen-Pseudoephed-APAP (CORICIDIN D PO) Take 1 tablet by mouth every other day as needed (cold symptoms).     Cholecalciferol (VITAMIN D PO) Take 1 tablet by mouth daily.     Cyanocobalamin (VITAMIN B-12 PO) Take 1 tablet by mouth daily.     ferrous sulfate 325 (65 FE) MG tablet Take 325 mg by mouth daily with breakfast.     gabapentin (NEURONTIN) 600 MG tablet Take 1 tablet (600 mg total) by mouth daily as needed (leg pain). 30 tablet 0   lisinopril-hydrochlorothiazide (ZESTORETIC) 20-12.5 MG tablet Take 2 tablets by mouth once daily 180 tablet 0   naproxen sodium (ALEVE) 220 MG tablet Take 220 mg by mouth 2 (two) times daily as needed (pain).     simvastatin (ZOCOR) 40 MG tablet TAKE 1 TABLET BY MOUTH AT BEDTIME (Patient taking differently: Take 40  mg by mouth at bedtime.) 90 tablet 0   Tetrahydrozoline HCl (VISINE OP) Place 1 drop into the right eye daily as needed (dry eyes/ irritation).      Objective: BP (!) 147/55   Pulse (!) 52   Temp 98.6 F (37 C) (Oral)   Resp 17   SpO2 100%  Exam: General: Non toxic, polite affect, comfortable with few episodes of mild-moderate pain Eyes: Clear conjunctiva, pupils reactive to light ENTM: Dry mucous membranes Neck: Soft, non-tender, no lymphadenopathy Cardiovascular: RRR, NRMG Respiratory: CTABL, no wheezes or stridor Gastrointestinal: Abdomen soft, non-distended, mild tenderness to palpation in lower left quadrant, near groin,  MSK: Pulses intact in all extremities, no edema Derm: hyper pigmented lacy rash located on lower back, with dry skin   Labs and Imaging: CBC BMET  Recent Labs  Lab 10/04/20 0212  WBC 8.2  HGB 10.9*  HCT 36.2  PLT 213  Recent Labs  Lab 10/04/20 0212  NA 136  K 4.2  CL 99  CO2 26  BUN 21  CREATININE 1.43*  GLUCOSE 197*  CALCIUM 9.8       Holley Bouche, MD 10/04/2020, 11:27 AM PGY-1, [place] Intern pager: [phone], text pages welcome  I was personally present and performed or re-performed the history, physical exam and medical decision making activities of this service and have verified that the service and findings are accurately documented in the resident's note.  Donney Dice, DO                  10/04/2020, 7:47 PM  PGY-2, [place]

## 2020-10-04 NOTE — Progress Notes (Signed)
I saw and examined Jasmine Gregory.  I discussed with the full resident team.  We have agreed on a plan.  I will cosign the H&PE when available.  Briefly, Jasmine Gregory has ~18 hours of left lower quadrant abd pain accompaied by stool x 4, initially formed then diarrhea.  Last stool had rectal bleeding.  Up to date on colon cancer scree with hx of adenomatous polyp and diverticulosis.  Issues: Abd pain.  Colitis by CT scan.  Etiology of colitis?  Infectious versus ischemic.  Doubt ulcerative colitis.  No recent antibiotics so post antibiotic C diff highly unlikely.   Rectal bleed, either secondary to colitis or from hemorrhoid/fissure from irritation of diarrhea/frequent stools. AKI, mild.  Presumed due to poor po intake and increased losses.  Hold HCTZ and ACE.  \ See resident not for full description of problems.

## 2020-10-05 LAB — COMPREHENSIVE METABOLIC PANEL
ALT: 18 U/L (ref 0–44)
AST: 18 U/L (ref 15–41)
Albumin: 2.7 g/dL — ABNORMAL LOW (ref 3.5–5.0)
Alkaline Phosphatase: 49 U/L (ref 38–126)
Anion gap: 7 (ref 5–15)
BUN: 10 mg/dL (ref 8–23)
CO2: 27 mmol/L (ref 22–32)
Calcium: 8.8 mg/dL — ABNORMAL LOW (ref 8.9–10.3)
Chloride: 105 mmol/L (ref 98–111)
Creatinine, Ser: 1.13 mg/dL — ABNORMAL HIGH (ref 0.44–1.00)
GFR, Estimated: 52 mL/min — ABNORMAL LOW (ref >60)
Glucose, Bld: 95 mg/dL (ref 70–99)
Potassium: 3.6 mmol/L (ref 3.5–5.1)
Sodium: 139 mmol/L (ref 135–145)
Total Bilirubin: 0.5 mg/dL (ref 0.3–1.2)
Total Protein: 6 g/dL — ABNORMAL LOW (ref 6.5–8.1)

## 2020-10-05 LAB — LIPID PANEL
Cholesterol: 118 mg/dL (ref 0–200)
HDL: 44 mg/dL (ref >40)
LDL Cholesterol: 64 mg/dL (ref 0–99)
Total CHOL/HDL Ratio: 2.7 RATIO
Triglycerides: 49 mg/dL (ref <150)
VLDL: 10 mg/dL (ref 0–40)

## 2020-10-05 LAB — CBC
HCT: 29.7 % — ABNORMAL LOW (ref 36.0–46.0)
Hemoglobin: 9.3 g/dL — ABNORMAL LOW (ref 12.0–15.0)
MCH: 27.9 pg (ref 26.0–34.0)
MCHC: 31.3 g/dL (ref 30.0–36.0)
MCV: 89.2 fL (ref 80.0–100.0)
Platelets: 213 10*3/uL (ref 150–400)
RBC: 3.33 MIL/uL — ABNORMAL LOW (ref 3.87–5.11)
RDW: 13.6 % (ref 11.5–15.5)
WBC: 9 10*3/uL (ref 4.0–10.5)
nRBC: 0 % (ref 0.0–0.2)

## 2020-10-05 LAB — HEMOGLOBIN A1C
Hgb A1c MFr Bld: 5.9 % — ABNORMAL HIGH (ref 4.8–5.6)
Mean Plasma Glucose: 122.63 mg/dL

## 2020-10-05 MED ORDER — CIPROFLOXACIN HCL 500 MG PO TABS
500.0000 mg | ORAL_TABLET | Freq: Two times a day (BID) | ORAL | Status: DC
  Administered 2020-10-05 – 2020-10-06 (×3): 500 mg via ORAL
  Filled 2020-10-05 (×3): qty 1

## 2020-10-05 MED ORDER — METRONIDAZOLE 500 MG PO TABS
500.0000 mg | ORAL_TABLET | Freq: Two times a day (BID) | ORAL | Status: DC
  Administered 2020-10-05 – 2020-10-06 (×3): 500 mg via ORAL
  Filled 2020-10-05 (×3): qty 1

## 2020-10-05 NOTE — Evaluation (Signed)
Occupational Therapy Evaluation and Discharge from OT Patient Details Name: Jasmine Gregory MRN: [id number] DOB: Mar 04, 1950 Today's Date: 10/05/2020   History of Present Illness Pt is a 70 y.o. female presenting with left lower quadrant, associated with nausea, vomiting, loose stools - one bloody. Work up revealed colitis of unknown etiology.  PMH is significant for diverticulosis, hemorrhoids, GERD, hypertension, hyperlipidemia, MDD/GAD, lupus.   Clinical Impression   At baseline Pt is overall mod I for ADL and mobility, family drives her - niece helps her get her back when bathing. Today Pt is independent in bed mobility, in room mobility, transfers, UB and LB ADL - provided with long handle sponge to increase independence in UB bathing. Education complete and Pt with no further questions for PT. OT will sign off at this time.       Recommendations for follow up therapy are one component of a multi-disciplinary discharge planning process, led by the attending physician.  Recommendations may be updated based on patient status, additional functional criteria and insurance authorization.   Follow Up Recommendations  No OT follow up    Equipment Recommendations  None recommended by OT    Recommendations for Other Services       Precautions / Restrictions Precautions Precautions: None Precaution Comments: enteric      Mobility Bed Mobility Overal bed mobility: Independent                  Transfers Overall transfer level: Independent Equipment used: None                  Balance Overall balance assessment: No apparent balance deficits (not formally assessed)                                         ADL either performed or assessed with clinical judgement   ADL Overall ADL's : Modified independent                                       General ADL Comments: provided with long handle sponge to assist with reaching back during  bathing - even though at baseline she does get occasional assist from niece. Pt verbalized and demonstrated how to use AE     Vision Patient Visual Report: No change from baseline       Perception     Praxis      Pertinent Vitals/Pain Pain Assessment: No/denies pain     Hand Dominance Right   Extremity/Trunk Assessment Upper Extremity Assessment Upper Extremity Assessment: Overall WFL for tasks assessed   Lower Extremity Assessment Lower Extremity Assessment: Defer to PT evaluation   Cervical / Trunk Assessment Cervical / Trunk Assessment: Normal   Communication Communication Communication: No difficulties   Cognition Arousal/Alertness: Awake/alert Behavior During Therapy: WFL for tasks assessed/performed Overall Cognitive Status: Within Functional Limits for tasks assessed                                     General Comments  Pt very pleasant    Exercises     Shoulder Instructions      Home Living Family/patient expects to be discharged to:: Private residence Living Arrangements: Alone Available Help at Discharge: Family;Available PRN/intermittently Type  of Home: Apartment Home Access: Stairs to enter [employer] of Steps: 1 Entrance Stairs-Rails: None Home Layout: One level     Bathroom Shower/Tub: Teacher, early years/pre: Standard Bathroom Accessibility: Yes How Accessible: Accessible via walker Home Equipment: None          Prior Functioning/Environment Level of Independence: Independent        Comments: neice drives her as she has some vertigo and helps get her back during bath        OT Problem List: Decreased activity tolerance      OT Treatment/Interventions:      OT Goals(Current goals can be found in the care plan section) Acute Rehab OT Goals Patient Stated Goal: get home OT Goal Formulation: With patient Time For Goal Achievement: 10/12/20 Potential to Achieve Goals: Good  OT Frequency:      Barriers to D/C:            Co-evaluation              AM-PAC OT "6 Clicks" Daily Activity     Outcome Measure Help from another person eating meals?: None Help from another person taking care of personal grooming?: None Help from another person toileting, which includes using toliet, bedpan, or urinal?: None Help from another person bathing (including washing, rinsing, drying)?: A Little Help from another person to put on and taking off regular upper body clothing?: None Help from another person to put on and taking off regular lower body clothing?: None 6 Click Score: 23   End of Session Nurse Communication: Mobility status  Activity Tolerance: Patient tolerated treatment well Patient left: in bed;with call bell/phone within reach  OT Visit Diagnosis: Other abnormalities of gait and mobility (R26.89)                Time: [id number] OT Time Calculation (min): 9 min Charges:  OT General Charges $OT Visit: 1 Visit OT Evaluation $OT Eval Low Complexity: [place] OTR/L Acute Rehabilitation Services Pager: [phone] Office: [place] 10/05/2020, 4:32 PM

## 2020-10-05 NOTE — Hospital Course (Addendum)
Colitis, likely ischemic in origin Patient presented with LLQ pain and bloody stool. CT A/P notable for "diffuse circumferential wall thickening of the descending colon with mild adjacent inflammation, compatible with colitis." Her hemoglobin was 10.9 on admission. She was started on ciprofloxacin and metronidazole given concern for infectious colitis. GI PCR negative for pathogens. Given this, her ABX were discontinued. On the day of discharge the patient denied ABD pain and her ABD was not tender to palpation. She reported teaspoon sized amount of red blood mixed with her stools. Her hemoglobin was stable at 9.1 on discharge. She was discharged with outpatient GI follow up.   AKI The patient had an AKI to 1.43 on admission. Her baseline was 1.01-1.16. On day of discahrge her cr was 1.13 (within her baseline).   Hypertension Her home medications of lisinopril/HCTZ were held due to some soft blood pressures. She was instructed to resume these medications after discharge.   Hyperlipidemia Total cholesterol was appropriate 118, HDL appropriate 44, LDL appropriate at 64. Continued home simvastatin 40 mg.   Prediabetes Patient had elevated blood glucose to 197 on admission. Hemoglobin A1c showed prediabetes at 5.9%.  Outpatient GI follow up for colonoscopy. Had adenomatous polyps requires 5 year screenings. (2023 due).  Counseling regarding nutrition and exercise given prediabetes.

## 2020-10-05 NOTE — Evaluation (Signed)
Physical Therapy Evaluation Patient Details Name: Jasmine Gregory MRN: [id number] DOB: September 23, 1950 Today's Date: 10/05/2020  History of Present Illness  Pt is a 70 y.o. female presenting with left lower quadrant, associated with nausea, vomiting, loose stools - one bloody. Work up revealed colitis of unknown etiology.  PMH is significant for diverticulosis, hemorrhoids, GERD, hypertension, hyperlipidemia, MDD/GAD, lupus.  Clinical Impression  Patient is independent with all mobility.  Reports her niece drives for her due to some vertigo (has seen PT in past for this).  No further PT needs identified and will sign off.        Recommendations for follow up therapy are one component of a multi-disciplinary discharge planning process, led by the attending physician.  Recommendations may be updated based on patient status, additional functional criteria and insurance authorization.  Follow Up Recommendations No PT follow up    Equipment Recommendations  None recommended by PT    Recommendations for Other Services       Precautions / Restrictions Precautions Precautions: None      Mobility  Bed Mobility Overal bed mobility: Independent                  Transfers Overall transfer level: Independent Equipment used: None                Ambulation/Gait Ambulation/Gait assistance: Independent Gait Distance (Feet): 150 Feet Assistive device: None Gait Pattern/deviations: WFL(Within Functional Limits) Gait velocity: steady   General Gait Details: able to take variations in speed and mild perturbations without loss of balance  Stairs            Wheelchair Mobility    Modified Rankin (Stroke Patients Only)       Balance Overall balance assessment: No apparent balance deficits (not formally assessed)                                           Pertinent Vitals/Pain Pain Assessment: No/denies pain    Home Living Family/patient expects to  be discharged to:: Private residence Living Arrangements: Alone Available Help at Discharge: Family;Available PRN/intermittently Type of Home: Apartment Home Access: Stairs to enter Entrance Stairs-Rails: None Entrance Stairs-Number of Steps: 1 Home Layout: One level Home Equipment: None      Prior Function Level of Independence: Independent         Comments: neice drives her as she has some vertigo and helps get her back during bath     Hand Dominance        Extremity/Trunk Assessment   Upper Extremity Assessment Upper Extremity Assessment: Overall WFL for tasks assessed    Lower Extremity Assessment Lower Extremity Assessment: Overall WFL for tasks assessed    Cervical / Trunk Assessment Cervical / Trunk Assessment: Normal  Communication   Communication: No difficulties  Cognition Arousal/Alertness: Awake/alert Behavior During Therapy: WFL for tasks assessed/performed Overall Cognitive Status: Within Functional Limits for tasks assessed                                        General Comments      Exercises     Assessment/Plan    PT Assessment Patent does not need any further PT services  PT Problem List         PT Treatment Interventions  PT Goals (Current goals can be found in the Care Plan section)  Acute Rehab PT Goals Patient Stated Goal: get home PT Goal Formulation: All assessment and education complete, DC therapy    Frequency     Barriers to discharge        Co-evaluation               AM-PAC PT "6 Clicks" Mobility  Outcome Measure Help needed turning from your back to your side while in a flat bed without using bedrails?: None Help needed moving from lying on your back to sitting on the side of a flat bed without using bedrails?: None Help needed moving to and from a bed to a chair (including a wheelchair)?: None Help needed standing up from a chair using your arms (e.g., wheelchair or bedside chair)?:  None Help needed to walk in hospital room?: None Help needed climbing 3-5 steps with a railing? : A Little 6 Click Score: 23    End of Session   Activity Tolerance: Patient tolerated treatment well Patient left: in bed;with call bell/phone within reach   PT Visit Diagnosis: Muscle weakness (generalized) (M62.81)    Time: [id number] PT Time Calculation (min) (ACUTE ONLY): 8 min   Charges:   PT Evaluation $PT Eval Low Complexity: 1 Low          10/05/2020 Margie, PT Acute Rehabilitation Services Pager:  [phone] Office:  [place] 10/05/2020, 1:57 PM

## 2020-10-05 NOTE — Progress Notes (Signed)
Family Medicine Teaching Service Daily Progress Note Intern Pager: [phone]  Patient name: Jasmine Gregory Medical record number: [id number] Date of birth: 21-Apr-1950 Age: 70 y.o. Gender: female  Primary Care Provider: Gerlene Fee, DO Consultants: None Code Status: Full  Pt Overview and Major Events to Date:  9/17: Admitted for colitis  Assessment and Plan: Jasmine Gregory is a 70 year old woman presenting with left lower quadrant pain consistent with colitis.  Past medical history significant for diverticulosis, hemorrhoids, GERD, hypertension, hyperlipidemia, MDD/GAD, lupus.  Colitis Reports that she is feeling much better today after IVF, abx, and clear liquid diet.  Colitis was identified via CT imaging, however unclear exact etiology.  Given improvement of pain suspect ischemic colitis is unlikely, possibly infectious.  DDx still includes diverticulitis.  As she is improving today will increase her to regular diet and continue to monitor.  If she tolerates food well today, plan for discharge tomorrow.   -ADAT to full liquids -CMP -Continue cipro, flagyl PO x 7d -Enteric precautions  Anemia Patient had 1 bowel movement that had some streaks of bright red blood mixed in with bowel movement.  Both nurse and patient remarked this was was a small amount.  Her hemoglobin went from 10.9 admission to 9.3 today.  However patient states that she feels much better.  Patient had not been unable to take p.o. for at least a day prior to this suspect that the difference between 10.9 and 9.3 and hemoglobin is likely dilutional given the fact that patient feels much better and work-up for anemia is unlikely to be iron deficiency.  Blood in stool with small amount mixed in as described or on toilet paper as previously described, most likely indication is hemorrhoids which patient has.  Patient's last colonoscopy was February 2018, which showed an adenomatous polyp that was removed.  Dr. Ardis Hughs  recommended that she have a colonoscopy in 5 years.  Will recommend patient follow-up with GI for routine screening colonoscopy in 2023. -A.m. CBC -Follow-up with GI for routine screening colonoscopy  AKI- resolved Patient was unable to tolerate anything by mouth for 1 day and creatinine on admission was a elevated to 1.43.  Baseline is 1.01-1.16.  Today serum creatinine is much improved at 1.13. -AM BMP  Hypertension Blood pressure has been stable, mostly mildly hypertensive, most recently 116/54.  Home hypertensive of lisinopril HCTZ was held for AKI.  Given mildly soft pressures today will hold medication and hopefully start tomorrow. -Continue holding home lisinopril-HCTZ  Hyperlipidemia Total cholesterol was appropriate 118, HDL appropriate 44, LDL appropriate at 64.   -Continue home simvastatin 40 mg.   Prediabetes Patient had elevated blood glucose to 197 on admission, it has been propria since then at 95.  Hemoglobin A1c showed prediabetes at 5.9%. -Follow-up with PCP regarding prediabetes  The rest of patient's problems are chronic and stable, see H&P for more details  FEN/GI: Full liquid diet PPx: SCDs Dispo:Home tomorrow. Barriers include tolerate regular diet.   Subjective:  Is feeling much improved.  She tolerated her full liquid diet well yesterday and has diminished/no pain in her left lower quadrant.  Nursing did report that they found some blood streaked in her stool, small amount.  Objective: Temp:  [98 F (36.7 C)-99.3 F (37.4 C)] 98.1 F (36.7 C) (09/18 0834) Pulse Rate:  [51-85] 63 (09/18 0834) Resp:  [15-29] 16 (09/18 0834) BP: (116-160)/(51-86) 116/54 (09/18 0834) SpO2:  [96 %-100 %] 100 % (09/18 0834) Physical Exam: General: Age-appropriate, AAW, resting comfortably  in bed, NAD, alert and at baseline Cardiovascular: RRR, M/R/G, 2+ radial pulses Respiratory: CTAB, no increased work of breathing Abdomen: Soft, NT, ND, normal bowel sounds Extremities:  2+ DP, warm, dry, skin intact, no edema  Laboratory: Recent Labs  Lab 09/29/20 0929 10/04/20 0212 10/05/20 0416  WBC 6.7 8.2 9.0  HGB 10.8* 10.9* 9.3*  HCT 33.8* 36.2 29.7*  PLT 168 213 213   Recent Labs  Lab 10/04/20 0212 10/05/20 0416  NA 136 139  K 4.2 3.6  CL 99 105  CO2 26 27  BUN 21 10  CREATININE 1.43* 1.13*  CALCIUM 9.8 8.8*  PROT 7.7 6.0*  BILITOT 0.5 0.5  ALKPHOS 66 49  ALT 23 18  AST 26 18  GLUCOSE 197* 95   Imaging/Diagnostic Tests: No results found.  Gladys Damme, MD 10/05/2020, 11:10 AM PGY-3, [employer] Intern pager: [phone], text pages welcome

## 2020-10-05 NOTE — Progress Notes (Signed)
FPTS Interim Progress Note  S:Patient c/o LLQ abdominal pain. Rounded with primary RN.  Pt had episode of blood streaked stool. No blood with wiping.  Appreciated nightly round.  O: BP 119/66 (BP Location: Left Arm)   Pulse (!) 56   Temp 99.1 F (37.3 C) (Oral)   Resp 16   SpO2 99%     A/P: LLQ Abdominal Pain  Colitis:   GIPP collected. Start Tylenol PRN  See H&P for plan.    Lyndee Hensen, DO PGY-3, [place] Intern pager [phone]

## 2020-10-05 NOTE — Progress Notes (Signed)
FPTS Brief Progress Note  S: Sitting up in bed. RN at bedside.    O: BP (!) 148/58 (BP Location: Left Arm)   Pulse 78   Temp 98.9 F (37.2 C) (Oral)   Resp 16   SpO2 100%   General: Appears well, no acute distress. Age appropriate. Respiratory: normal effort   A/P: 1. Abdominal pain, unspecified abdominal location  2. Colitis  3. AKI (acute kidney injury) ([place])  4. Rectal bleeding  - Orders reviewed. Labs for AM ordered, which was adjusted as needed.   Gerlene Fee, DO 10/05/2020, 10:35 PM PGY-3,  Family Medicine Night Resident  Please page [phone] with questions.

## 2020-10-06 LAB — GASTROINTESTINAL PANEL BY PCR, STOOL (REPLACES STOOL CULTURE)

## 2020-10-06 LAB — CBC
HCT: 29.2 % — ABNORMAL LOW (ref 36.0–46.0)
Hemoglobin: 9.1 g/dL — ABNORMAL LOW (ref 12.0–15.0)
MCH: 28.2 pg (ref 26.0–34.0)
MCHC: 31.2 g/dL (ref 30.0–36.0)
MCV: 90.4 fL (ref 80.0–100.0)
Platelets: 195 10*3/uL (ref 150–400)
RBC: 3.23 MIL/uL — ABNORMAL LOW (ref 3.87–5.11)
RDW: 13.3 % (ref 11.5–15.5)
WBC: 9 10*3/uL (ref 4.0–10.5)
nRBC: 0 % (ref 0.0–0.2)

## 2020-10-06 NOTE — Discharge Instructions (Addendum)
It was a pleasure taking care of you while you were in the hospital. You were treated for inflammation of the colon. The blood in your stool is most likely from hemorrhoids.  If you have trouble keeping down food or drink for a day or severe pain, please return to the emergency department to be reevaluated.  It is very important that you follow up with your GI doc for a colonoscopy.

## 2020-10-06 NOTE — Discharge Summary (Addendum)
[facility] Discharge Summary  Patient name: Jasmine Gregory Medical record number: [id number] Date of birth: March 07, 1950 Age: 70 y.o. Gender: female Date of Admission: 10/04/2020  Date of Discharge: 10/06/2020 Admitting Physician: Donney Dice, DO  Primary Care Provider: Gerlene Fee, DO Consultants: None  Indication for Hospitalization: LLQ pain, suspected colitis  Discharge Diagnoses/Problem List:  Colitis, likely ischemic AKI (resolved) Hypertension Hyperlipidemia Prediabetes  Disposition: home  Discharge Condition: stable, improved  Discharge Exam:  Physical Exam Vitals reviewed.  Cardiovascular:     Rate and Rhythm: Normal rate and regular rhythm.     Heart sounds: No murmur heard. Pulmonary:     Effort: Pulmonary effort is normal.     Breath sounds: Normal breath sounds.  Abdominal:     General: Bowel sounds are normal.     Palpations: Abdomen is soft.     Tenderness: There is no abdominal tenderness.  Neurological:     Mental Status: She is alert.    Brief Hospital Course:  Jasmine Gregory is a 70 year old woman presenting with left lower quadrant pain consistent with colitis.  Past medical history significant for diverticulosis, hemorrhoids, GERD, hypertension, hyperlipidemia, MDD/GAD, lupus.  Colitis, likely ischemic in origin Patient presented with LLQ pain and bloody stool. CT A/P notable for "diffuse circumferential wall thickening of the descending colon with mild adjacent inflammation, compatible with colitis." Her hemoglobin was 10.9 on admission. She was started on ciprofloxacin and metronidazole given concern for infectious colitis. GI PCR negative for pathogens. Given this, her ABX were discontinued. On the day of discharge the patient denied ABD pain and her ABD was not tender to palpation. Patient's hemoglobin was stable at 9.1 on discharge. She was instructed to follow up with GI outpatient at discharge.   AKI The  patient had an AKI to 1.43 on admission. Her baseline was 1.01-1.16. AKI resolved on day of discahrge her as her Cr was 1.13 (within her baseline).   Hypertension Her home medications of lisinopril/HCTZ were held due to some soft blood pressures. She was instructed to resume these medications after discharge.   Hyperlipidemia Total cholesterol was appropriate 118, HDL appropriate 44, LDL appropriate at 64. Continued home simvastatin 40 mg.   Prediabetes Patient had elevated blood glucose to 197 on admission. Hemoglobin A1c showed prediabetes at 5.9%.  Issues for follow up:  Outpatient GI follow up for colonoscopy. Had adenomatous polyps requires 5 year screenings. (2023 due).  Counseling regarding nutrition and exercise given prediabetes.  Consider repeat BMP to ensure AKI remains resolved.    Significant Procedures: none  Significant Labs and Imaging:  Recent Labs  Lab 10/04/20 0212 10/05/20 0416 10/06/20 0131  WBC 8.2 9.0 9.0  HGB 10.9* 9.3* 9.1*  HCT 36.2 29.7* 29.2*  PLT 213 213 195   Recent Labs  Lab 10/04/20 0212 10/05/20 0416  NA 136 139  K 4.2 3.6  CL 99 105  CO2 26 27  GLUCOSE 197* 95  BUN 21 10  CREATININE 1.43* 1.13*  CALCIUM 9.8 8.8*  ALKPHOS 66 49  AST 26 18  ALT 23 18  ALBUMIN 3.7 2.7*     Results/Tests Pending at Time of Discharge: None  Discharge Medications:  Allergies as of 10/06/2020   No Known Allergies      Medication List     STOP taking these medications    CORICIDIN D PO   VISINE OP       TAKE these medications    acetaminophen 500 MG tablet  Commonly known as: TYLENOL Take 1 tablet (500 mg total) by mouth 3 (three) times daily. What changed:  when to take this additional instructions   aspirin EC 81 MG tablet Take 81 mg by mouth daily. Swallow whole.   cetirizine 10 MG tablet Commonly known as: ZYRTEC Take 1 tablet (10 mg total) by mouth daily as needed. What changed: reasons to take this   ferrous sulfate  325 (65 FE) MG tablet Take 325 mg by mouth daily with breakfast.   gabapentin 600 MG tablet Commonly known as: NEURONTIN Take 1 tablet (600 mg total) by mouth daily as needed (leg pain).   lisinopril-hydrochlorothiazide 20-12.5 MG tablet Commonly known as: ZESTORETIC Take 2 tablets by mouth once daily   naproxen sodium 220 MG tablet Commonly known as: ALEVE Take 220 mg by mouth 2 (two) times daily as needed (pain).   simvastatin 40 MG tablet Commonly known as: ZOCOR TAKE 1 TABLET BY MOUTH AT BEDTIME   VITAMIN B-12 PO Take 1 tablet by mouth daily.   VITAMIN D PO Take 1 tablet by mouth daily.        Discharge Instructions: Please refer to Patient Instructions section of EMR for full details.  Patient was counseled important signs and symptoms that should prompt return to medical care, changes in medications, dietary instructions, activity restrictions, and follow up appointments.   Follow-Up Appointments:  Follow-up Information     Gifford Shave, MD. Go on 10/14/2020.   Specialty: Family Medicine Why: Please arrive 15 mins early for a 2:50 PM appt Contact information: [id number] [place] [address] [phone]         Milus Banister, MD. Call.   Specialty: Gastroenterology Contact information: [address] [name] [address] [phone]                 Nick Gabrielle PGY-1, Psychiatry [facility] Family Medicine   I was personally present and performed or re-performed the history, physical exam and medical decision making activities of this service and have verified that the service and findings are accurately documented in the resident's note. My edits are noted within the note. Please also see attending's attestation.   Donney Dice, DO                  10/06/2020, 8:01 PM  PGY-2, [place]

## 2020-10-06 NOTE — Progress Notes (Signed)
Discharge instructions explained and given to patient. IV removed. Patient dressed herself. Patient will be wheeled down to meet her ride home.

## 2020-10-06 NOTE — Plan of Care (Signed)

## 2020-10-14 ENCOUNTER — Other Ambulatory Visit: Payer: Self-pay

## 2020-10-14 ENCOUNTER — Encounter: Payer: Self-pay | Admitting: Family Medicine

## 2020-10-14 ENCOUNTER — Ambulatory Visit (INDEPENDENT_AMBULATORY_CARE_PROVIDER_SITE_OTHER): Payer: Medicare Other | Admitting: Family Medicine

## 2020-10-14 VITALS — BP 138/69 | HR 97 | Wt 219.4 lb

## 2020-10-14 DIAGNOSIS — N179 Acute kidney failure, unspecified: Secondary | ICD-10-CM | POA: Diagnosis not present

## 2020-10-14 DIAGNOSIS — I10 Essential (primary) hypertension: Secondary | ICD-10-CM

## 2020-10-14 NOTE — Progress Notes (Signed)
    SUBJECTIVE:   CHIEF COMPLAINT / HPI:   Hospital f/u Patient was recently admitted to the hospital for colitis.  Also noted to have AKI.  Has been doing very well since discharge and has no concerns at this time.  She has spoken with GI and is going to have an appointment on the 25th to determine if she needs an early colonoscopy.  We discussed her dietary concerns because she is prediabetes and she loves to drink sweet drinks such as juices, Pepsi's, Kool-Aid.  OBJECTIVE:   BP 138/69   Pulse 97   Wt 219 lb 6.4 oz (99.5 kg)   SpO2 98%   BMI 38.86 kg/m   General: Well-appearing, no acute distress Cardiac: Regular rate and rhythm, no murmurs appreciated Respiratory: Normal work of breathing, lungs clear to auscultation Abdomen: Soft, nontender, positive bowel sounds  ASSESSMENT/PLAN:   AKI (acute kidney injury) ([place]) Patient had AKI while hospitalized.  Repeat BMP collected today to make sure that it is continuing to improve.  Discussed avoiding nephrotoxic agents.    Gifford Shave, MD [place]

## 2020-10-14 NOTE — Patient Instructions (Signed)
It was great seeing you today.  I am glad you are feeling better from your hospital stay.  I need to repeat some lab work today.  Regarding your prediabetes I recommend decreasing your sugar intake, especially with your fluids.  Regarding the medication concerns you can continue to use Visine as needed.  I recommend cough medications without Sudafed.  If you have any questions or concerns please call the clinic.  I hope you have a wonderful afternoon!

## 2020-10-15 LAB — BASIC METABOLIC PANEL
BUN/Creatinine Ratio: 13 (ref 12–28)
BUN: 15 mg/dL (ref 8–27)
CO2: 28 mmol/L (ref 20–29)
Calcium: 10.3 mg/dL (ref 8.7–10.3)
Chloride: 99 mmol/L (ref 96–106)
Creatinine, Ser: 1.17 mg/dL — ABNORMAL HIGH (ref 0.57–1.00)
Glucose: 152 mg/dL — ABNORMAL HIGH (ref 70–99)
Potassium: 5.3 mmol/L — ABNORMAL HIGH (ref 3.5–5.2)
Sodium: 142 mmol/L (ref 134–144)
eGFR: 50 mL/min/{1.73_m2} — ABNORMAL LOW (ref >59)

## 2020-10-15 NOTE — Assessment & Plan Note (Signed)
Patient had AKI while hospitalized.  Repeat BMP collected today to make sure that it is continuing to improve.  Discussed avoiding nephrotoxic agents.

## 2020-10-22 ENCOUNTER — Other Ambulatory Visit: Payer: Self-pay | Admitting: Family Medicine

## 2020-10-22 DIAGNOSIS — I10 Essential (primary) hypertension: Secondary | ICD-10-CM

## 2020-11-11 ENCOUNTER — Encounter: Payer: Self-pay | Admitting: Gastroenterology

## 2020-11-11 ENCOUNTER — Ambulatory Visit (INDEPENDENT_AMBULATORY_CARE_PROVIDER_SITE_OTHER): Payer: Medicare Other | Admitting: Gastroenterology

## 2020-11-11 DIAGNOSIS — Z860100 Personal history of colon polyps, unspecified: Secondary | ICD-10-CM

## 2020-11-11 DIAGNOSIS — R935 Abnormal findings on diagnostic imaging of other abdominal regions, including retroperitoneum: Secondary | ICD-10-CM

## 2020-11-11 MED ORDER — SUTAB 1479-225-188 MG PO TABS: 24.0000 | ORAL_TABLET | ORAL | 0 refills | Status: DC

## 2020-11-11 NOTE — Patient Instructions (Signed)
If you are age 72 or older, your body mass index should be between 23-30. Your Body mass index is 38.68 kg/m. If this is out of the aforementioned range listed, please consider follow up with your Primary Care Provider.  The [facility] providers would like to encourage you to use [facility] to communicate with providers for non-urgent requests or questions.  Due to long hold times on the telephone, sending your provider a message by [facility] may be a faster and more efficient way to get a response.  Please allow 48 business hours for a response.  Please remember that this is for non-urgent requests.  _______________________________________________________  Dennis Bast have been scheduled for a colonoscopy. Please follow written instructions given to you at your visit today.  Please pick up your prep supplies at the pharmacy within the next 1-3 days. If you use inhalers (even only as needed), please bring them with you on the day of your procedure.  Due to recent changes in healthcare laws, you may see the results of your imaging and laboratory studies on MyChart before your provider has had a chance to review them.  We understand that in some cases there may be results that are confusing or concerning to you. Not all laboratory results come back in the same time frame and the provider may be waiting for multiple results in order to interpret others.  Please give [place] 48 hours in order for your provider to thoroughly review all the results before contacting the office for clarification of your results.   Thank you for entrusting me with your care and choosing [facility].  Dr Ardis Hughs

## 2020-11-11 NOTE — Progress Notes (Signed)
Review of pertinent gastrointestinal problems: 1.  History of precancerous colon polyps.  Colonoscopy February 2018 for routine risk screening found a single subcentimeter sessile serrated polyp.  Her colon was also quite tortuous in the sigmoid which related to diverticular related changes.  Required pediatric colonoscope to complete.   HPI: This is a very pleasant 70 year old woman whom I last saw almost 5 years ago at the time of colonoscopy.  See the results summarized above  She was recently hospitalized with left lower quadrant pain, bloody diarrhea.  Initially felt possibly to have infectious colitis however GI pathogen panel was negative, antibiotics were stopped.  Blood work September 2022 hemoglobin 9.1, MCV 90.4, creatinine 1.1, LFTs normal.  Iron studies showed slightly low total iron at 27, ferritin was normal.  CT scan abdomen pelvis without IV contrast September 2022, indication acute left abdominal and pelvic pain" findings diffuse circumferential wall thickening of the descending colon with mild adjacent inflammation compatible with colitis.  No evidence of bowel obstruction or pneumoperitoneum or focal collection".  GI pathogen panel September 2022 was negative  She was hospitalized for 2 nights last month.  Admitted with left lower quadrant pain and bloody stools.  CT scan suggested a circumferential wall thickening of the descending colon.  There is also suspicious for ischemic or possibly infectious colitis.  She spent 2 nights in the hospital.  Since then she has recovered quite well and is completely back to normal.  Overall her weight is stable.   Review of systems: Pertinent positive and negative review of systems were noted in the above HPI section. All other review negative.   Past Medical History:  Diagnosis Date   Anemia    Anxiety    Arthritis    Depression    Diverticulosis    Environmental and seasonal allergies    GERD (gastroesophageal reflux disease)     Hemorrhoids    History of blood transfusion    Hyperlipidemia    Hypertension    Hypovitaminosis D 07/20/2011   Vitamin D was 17 in May 2012. Patient has not taken any Vitamin D supplement since that time.      Murmur, heart    Obesity    Vitamin B 12 deficiency 07/20/2011   Vitamin B12 was low at 169 in May 2012. No evidence of supplementation since that time.     Past Surgical History:  Procedure Laterality Date   NO PAST SURGERIES      Current Outpatient Medications  Medication Sig Dispense Refill   acetaminophen (TYLENOL) 500 MG tablet Take 1 tablet (500 mg total) by mouth 3 (three) times daily. (Patient taking differently: Take 500 mg by mouth See admin instructions. Take 1 tablet (500 mg) by mouth every morning, may also take 1 tablet (500 mg) every 6 hours as needed for pain) 90 tablet 0   aspirin EC 81 MG tablet Take 81 mg by mouth daily. Swallow whole.     cetirizine (ZYRTEC) 10 MG tablet Take 1 tablet (10 mg total) by mouth daily as needed. (Patient taking differently: Take 10 mg by mouth daily as needed for allergies (congestion).) 30 tablet 0   Cholecalciferol (VITAMIN D PO) Take 1 tablet by mouth daily.     Cyanocobalamin (VITAMIN B-12 PO) Take 1 tablet by mouth daily.     ferrous sulfate 325 (65 FE) MG tablet Take 325 mg by mouth daily with breakfast.     gabapentin (NEURONTIN) 600 MG tablet Take 1 tablet (600 mg total) by  mouth daily as needed (leg pain). 30 tablet 0   lisinopril-hydrochlorothiazide (ZESTORETIC) 20-12.5 MG tablet Take 2 tablets by mouth once daily 180 tablet 0   naproxen sodium (ALEVE) 220 MG tablet Take 220 mg by mouth 2 (two) times daily as needed (pain).     simvastatin (ZOCOR) 40 MG tablet TAKE 1 TABLET BY MOUTH AT BEDTIME (Patient taking differently: Take 40 mg by mouth at bedtime.) 90 tablet 0   No current facility-administered medications for this visit.    Allergies as of 11/11/2020   (No Known Allergies)    Family History  Problem  Relation Age of Onset   Melanoma Mother    Breast cancer Maternal Aunt    Prostate cancer Maternal Uncle        x 2   Esophageal cancer Neg Hx    Pancreatic cancer Neg Hx    Colon cancer Neg Hx    Stomach cancer Neg Hx    Liver disease Neg Hx     Social History   Socioeconomic History   Marital status: Single    Spouse name: Not on file   Number of children: 2   Years of education: Not on file   Highest education level: Not on file  Occupational History   Not on file  Tobacco Use   Smoking status: Never   Smokeless tobacco: Never  Vaping Use   Vaping Use: Never used  Substance and Sexual Activity   Alcohol use: No   Drug use: No   Sexual activity: Not on file  Other Topics Concern   Not on file  Social History Narrative   Not on file   Social Determinants of Health   Financial Resource Strain: Not on file  Food Insecurity: Not on file  Transportation Needs: Not on file  Physical Activity: Not on file  Stress: Not on file  Social Connections: Not on file  Intimate Partner Violence: Not on file     Physical Exam: BP 120/60   Pulse 76   Ht 5\' 3"  (1.6 m)   Wt 218 lb 6 oz (99.1 kg)   BMI 38.68 kg/m  Constitutional: generally well-appearing Psychiatric: alert and oriented x3 Eyes: extraocular movements intact Mouth: oral pharynx moist, no lesions Neck: supple no lymphadenopathy Cardiovascular: heart regular rate and rhythm Lungs: clear to auscultation bilaterally Abdomen: soft, nontender, nondistended, no obvious ascites, no peritoneal signs, normal bowel sounds Extremities: no lower extremity edema bilaterally Skin: no lesions on visible extremities   Assessment and plan: 70 y.o. female with recent bloody diarrhea, left lower quadrant pain, possibly ischemic colitis, possibly infectious colitis  Seems most likely she had episode of fairly mild left-sided ischemic colitis.  Unclear what caused this.  She had no preceding constipation, no significant  exertion, no dehydration, no new medicine changes.  Fortunately she is feeling completely back to normal now.  She was due for surveillance colonoscopy in about 3 to 4 months and I recommended we do this appropriately for her to make sure we are not missing anything more serious such as a neoplasm.  Given the fact that she had bloody diarrhea and her blood counts at discharge with a hemoglobin of 9 and I am getting a CBC today to see if her counts have stabilized.  Reviewing her previous record it looks like she will need a pediatric colonoscope.  She requested Sutab as her prep and I think it is reasonable.  Please see the "Patient Instructions" section for addition details about  the plan.   Owens Loffler, MD [facility] 11/11/2020, 3:16 PM  Cc: Gerlene Fee, DO  Total time on date of encounter was 45 minutes (this included time spent preparing to see the patient reviewing records; obtaining and/or reviewing separately obtained history; performing a medically appropriate exam and/or evaluation; counseling and educating the patient and family if present; ordering medications, tests or procedures if applicable; and documenting clinical information in the health record).

## 2020-12-22 ENCOUNTER — Other Ambulatory Visit: Payer: Self-pay

## 2020-12-22 ENCOUNTER — Ambulatory Visit (AMBULATORY_SURGERY_CENTER): Payer: Medicare Other | Admitting: Gastroenterology

## 2020-12-22 ENCOUNTER — Encounter: Payer: Self-pay | Admitting: Gastroenterology

## 2020-12-22 VITALS — BP 109/61 | HR 71 | Temp 98.6°F | Resp 16 | Ht 63.0 in | Wt 218.0 lb

## 2020-12-22 DIAGNOSIS — K5730 Diverticulosis of large intestine without perforation or abscess without bleeding: Secondary | ICD-10-CM

## 2020-12-22 DIAGNOSIS — D124 Benign neoplasm of descending colon: Secondary | ICD-10-CM

## 2020-12-22 DIAGNOSIS — Z86010 Personal history of colonic polyps: Secondary | ICD-10-CM | POA: Diagnosis not present

## 2020-12-22 DIAGNOSIS — R933 Abnormal findings on diagnostic imaging of other parts of digestive tract: Secondary | ICD-10-CM | POA: Diagnosis not present

## 2020-12-22 DIAGNOSIS — K635 Polyp of colon: Secondary | ICD-10-CM

## 2020-12-22 MED ORDER — SODIUM CHLORIDE 0.9 % IV SOLN: 500.0000 mL | Freq: Once | INTRAVENOUS | Status: DC

## 2020-12-22 NOTE — Patient Instructions (Signed)
1 polyp removed- await pathology  Please read over handouts about polyps, diverticulosis and high fiber diets  Continue your normal medications   YOU HAD AN ENDOSCOPIC PROCEDURE TODAY AT [facility]:   Refer to the procedure report that was given to you for any specific questions about what was found during the examination.  If the procedure report does not answer your questions, please call your gastroenterologist to clarify.  If you requested that your care partner not be given the details of your procedure findings, then the procedure report has been included in a sealed envelope for you to review at your convenience later.  YOU SHOULD EXPECT: Some feelings of bloating in the abdomen. Passage of more gas than usual.  Walking can help get rid of the air that was put into your GI tract during the procedure and reduce the bloating. If you had a lower endoscopy (such as a colonoscopy or flexible sigmoidoscopy) you may notice spotting of blood in your stool or on the toilet paper. If you underwent a bowel prep for your procedure, you may not have a normal bowel movement for a few days.  Please Note:  You might notice some irritation and congestion in your nose or some drainage.  This is from the oxygen used during your procedure.  There is no need for concern and it should clear up in a day or so.  SYMPTOMS TO REPORT IMMEDIATELY:  Following lower endoscopy (colonoscopy or flexible sigmoidoscopy):  Excessive amounts of blood in the stool  Significant tenderness or worsening of abdominal pains  Swelling of the abdomen that is new, acute  Fever of 100F or higher  For urgent or emergent issues, a gastroenterologist can be reached at any hour by calling [phone]. Do not use MyChart messaging for urgent concerns.    DIET:  We do recommend a small meal at first, but then you may proceed to your regular diet.  Drink plenty of fluids but you should avoid alcoholic beverages for  24 hours.  ACTIVITY:  You should plan to take it easy for the rest of today and you should NOT DRIVE or use heavy machinery until tomorrow (because of the sedation medicines used during the test).    FOLLOW UP: Our staff will call the number listed on your records 48-72 hours following your procedure to check on you and address any questions or concerns that you may have regarding the information given to you following your procedure. If we do not reach you, we will leave a message.  We will attempt to reach you two times.  During this call, we will ask if you have developed any symptoms of COVID 19. If you develop any symptoms (ie: fever, flu-like symptoms, shortness of breath, cough etc.) before then, please call [phone].  If you test positive for Covid 19 in the 2 weeks post procedure, please call and report this information to [place].    If any biopsies were taken you will be contacted by phone or by letter within the next 1-3 weeks.  Please call us at [phone] if you have not heard about the biopsies in 3 weeks.    SIGNATURES/CONFIDENTIALITY: You and/or your care partner have signed paperwork which will be entered into your electronic medical record.  These signatures attest to the fact that that the information above on your After Visit Summary has been reviewed and is understood.  Full responsibility of the confidentiality of this discharge information lies with  you and/or your care-partner.

## 2020-12-22 NOTE — Progress Notes (Signed)
Called to room to assist during endoscopic procedure.  Patient ID and intended procedure confirmed with present staff. Received instructions for my participation in the procedure from the performing physician.  

## 2020-12-22 NOTE — Progress Notes (Signed)
CNW - VS   

## 2020-12-22 NOTE — Op Note (Signed)
[facility] Patient Name: Jasmine Gregory Procedure Date: 12/22/2020 2:45 PM MRN: [id number] Endoscopist: Milus Banister , MD Age: 70 Referring MD:  Date of Birth: 06/02/1950 Gender: Female Account #: [id number] Procedure:                Colonoscopy Indications:              High risk colon cancer surveillance: Personal                            history of colonic polyps; colonoscopy 02/2016                            single subCM SSP; recent abnormal CT of the colon                            (thickened sigmoid) Medicines:                Monitored Anesthesia Care Procedure:                Pre-Anesthesia Assessment:                           - Prior to the procedure, a History and Physical                            was performed, and patient medications and                            allergies were reviewed. The patient's tolerance of                            previous anesthesia was also reviewed. The risks                            and benefits of the procedure and the sedation                            options and risks were discussed with the patient.                            All questions were answered, and informed consent                            was obtained. Prior Anticoagulants: The patient has                            taken no previous anticoagulant or antiplatelet                            agents. ASA Grade Assessment: II - A patient with                            mild systemic disease. After reviewing the risks  and benefits, the patient was deemed in                            satisfactory condition to undergo the procedure.                           After obtaining informed consent, the colonoscope                            was passed under direct vision. Throughout the                            procedure, the patient's blood pressure, pulse, and                            oxygen saturations were monitored continuously.  The                            PCF-HQ190L Colonoscope was introduced through the                            anus and advanced to the the cecum, identified by                            appendiceal orifice and ileocecal valve. The                            colonoscopy was performed without difficulty. The                            patient tolerated the procedure well. The quality                            of the bowel preparation was good. The ileocecal                            valve, appendiceal orifice, and rectum were                            photographed. Scope In: 2:52:07 PM Scope Out: 3:05:05 PM Scope Withdrawal Time: 0 hours 8 minutes 32 seconds  Total Procedure Duration: 0 hours 12 minutes 58 seconds  Findings:                 A 3 mm polyp was found in the descending colon. The                            polyp was sessile. The polyp was removed with a                            cold snare. Resection and retrieval were complete.                           Multiple small and large-mouthed diverticula were  found in the left colon. This was associated with                            significant edema, tortutousity and luminal                            narrowing.                           The exam was otherwise without abnormality on                            direct views (small rectal vault precluded                            retroflex view of the anus). Complications:            No immediate complications. Estimated blood loss:                            None. Estimated Blood Loss:     Estimated blood loss: none. Impression:               - One 3 mm polyp in the descending colon, removed                            with a cold snare. Resected and retrieved.                           - Diverticulosis in the left colon.                           - The examination was otherwise normal on direct                            views. Recommendation:            - Patient has a contact number available for                            emergencies. The signs and symptoms of potential                            delayed complications were discussed with the                            patient. Return to normal activities tomorrow.                            Written discharge instructions were provided to the                            patient.                           - Resume previous diet.                           -  Continue present medications.                           - Await pathology results. Milus Banister, MD 12/22/2020 3:08:59 PM This report has been signed electronically.

## 2020-12-22 NOTE — Progress Notes (Signed)
Colonoscopy February 2018 for routine risk screening found a single subcentimeter sessile serrated polyp.  Her colon was also quite tortuous in the sigmoid which related to diverticular related changes.  Required pediatric colonoscope to complete.   HPI: This is a woman with h/o polyps, recent abn CT   ROS: complete GI ROS as described in HPI, all other review negative.  Constitutional:  No unintentional weight loss   Past Medical History:  Diagnosis Date   Allergy    Anemia    Anxiety    Arthritis    Depression    Diverticulosis    Environmental and seasonal allergies    GERD (gastroesophageal reflux disease)    Hemorrhoids    History of blood transfusion    Hyperlipidemia    Hypertension    Hypovitaminosis D 07/20/2011   Vitamin D was 17 in May 2012. Patient has not taken any Vitamin D supplement since that time.      Murmur, heart    Obesity    Vertigo    Vitamin B 12 deficiency 07/20/2011   Vitamin B12 was low at 169 in May 2012. No evidence of supplementation since that time.     Past Surgical History:  Procedure Laterality Date   ABDOMINAL HYSTERECTOMY      Current Outpatient Medications  Medication Sig Dispense Refill   lisinopril-hydrochlorothiazide (ZESTORETIC) 20-12.5 MG tablet Take 2 tablets by mouth once daily 180 tablet 0   acetaminophen (TYLENOL) 500 MG tablet Take 1 tablet (500 mg total) by mouth 3 (three) times daily. (Patient taking differently: Take 500 mg by mouth See admin instructions. Take 1 tablet (500 mg) by mouth every morning, may also take 1 tablet (500 mg) every 6 hours as needed for pain) 90 tablet 0   aspirin EC 81 MG tablet Take 81 mg by mouth daily. Swallow whole.     cetirizine (ZYRTEC) 10 MG tablet Take 1 tablet (10 mg total) by mouth daily as needed. (Patient taking differently: Take 10 mg by mouth daily as needed for allergies (congestion).) 30 tablet 0   Cholecalciferol (VITAMIN D PO) Take 1 tablet by mouth daily.     Cyanocobalamin  (VITAMIN B-12 PO) Take 1 tablet by mouth daily.     ferrous sulfate 325 (65 FE) MG tablet Take 325 mg by mouth daily with breakfast.     gabapentin (NEURONTIN) 600 MG tablet Take 1 tablet (600 mg total) by mouth daily as needed (leg pain). 30 tablet 0   naproxen sodium (ALEVE) 220 MG tablet Take 220 mg by mouth 2 (two) times daily as needed (pain).     simvastatin (ZOCOR) 40 MG tablet TAKE 1 TABLET BY MOUTH AT BEDTIME (Patient taking differently: Take 40 mg by mouth at bedtime.) 90 tablet 0   Current Facility-Administered Medications  Medication Dose Route Frequency Provider Last Rate Last Admin   0.9 %  sodium chloride infusion  500 mL Intravenous Once Milus Banister, MD        Allergies as of 12/22/2020   (No Known Allergies)    Family History  Problem Relation Age of Onset   Melanoma Mother    Breast cancer Maternal Aunt    Colon cancer Maternal Uncle    Prostate cancer Maternal Uncle        x 2   Esophageal cancer Neg Hx    Pancreatic cancer Neg Hx    Stomach cancer Neg Hx    Liver disease Neg Hx    Rectal cancer Neg  Hx     Social History   Socioeconomic History   Marital status: Single    Spouse name: Not on file   Number of children: 2   Years of education: Not on file   Highest education level: Not on file  Occupational History   Not on file  Tobacco Use   Smoking status: Never   Smokeless tobacco: Never  Vaping Use   Vaping Use: Never used  Substance and Sexual Activity   Alcohol use: No   Drug use: No   Sexual activity: Not Currently    Birth control/protection: Surgical  Other Topics Concern   Not on file  Social History Narrative   Not on file   Social Determinants of Health   Financial Resource Strain: Not on file  Food Insecurity: Not on file  Transportation Needs: Not on file  Physical Activity: Not on file  Stress: Not on file  Social Connections: Not on file  Intimate Partner Violence: Not on file     Physical Exam: BP 123/86    Pulse (!) 112   Temp 98.6 F (37 C)   Ht 5\' 3"  (1.6 m)   Wt 218 lb (98.9 kg)   SpO2 100%   BMI 38.62 kg/m  Constitutional: generally well-appearing Psychiatric: alert and oriented x3 Lungs: CTA bilaterally Heart: no MCR  Assessment and plan: 70 y.o. female with h/o polyps, recent abnormal CT  Colonoscoyp today  Care is appropriate for the ambulatory setting.  Owens Loffler, MD [facility] 12/22/2020, 2:40 PM

## 2020-12-22 NOTE — Progress Notes (Signed)
PT taken to PACU. Monitors in place. VSS. Report given to RN. 

## 2020-12-24 ENCOUNTER — Telehealth: Payer: Self-pay | Admitting: *Deleted

## 2020-12-24 NOTE — Telephone Encounter (Signed)
  Follow up Call-  Call back number 12/22/2020  Post procedure Call Back phone  # [phone] cell  Permission to leave phone message Yes  Some recent data might be hidden     Patient questions:  Do you have a fever, pain , or abdominal swelling? No. Pain Score  0 *  Have you tolerated food without any problems? Yes.    Have you been able to return to your normal activities? Yes.    Do you have any questions about your discharge instructions: Diet   No. Medications  No. Follow up visit  No.  Do you have questions or concerns about your Care? No.  Actions: * If pain score is 4 or above: No action needed, pain <4.

## 2020-12-26 ENCOUNTER — Encounter: Payer: Self-pay | Admitting: Gastroenterology

## 2020-12-27 ENCOUNTER — Other Ambulatory Visit: Payer: Self-pay | Admitting: Family Medicine

## 2020-12-27 DIAGNOSIS — E785 Hyperlipidemia, unspecified: Secondary | ICD-10-CM

## 2021-01-05 DIAGNOSIS — Z20822 Contact with and (suspected) exposure to covid-19: Secondary | ICD-10-CM | POA: Diagnosis not present

## 2021-01-06 DIAGNOSIS — Z20822 Contact with and (suspected) exposure to covid-19: Secondary | ICD-10-CM | POA: Diagnosis not present

## 2021-01-06 DIAGNOSIS — Z03818 Encounter for observation for suspected exposure to other biological agents ruled out: Secondary | ICD-10-CM | POA: Diagnosis not present

## 2021-01-06 DIAGNOSIS — I10 Essential (primary) hypertension: Secondary | ICD-10-CM | POA: Diagnosis not present

## 2021-01-18 DIAGNOSIS — M109 Gout, unspecified: Secondary | ICD-10-CM

## 2021-01-18 HISTORY — DX: Gout, unspecified: M10.9

## 2021-01-23 ENCOUNTER — Other Ambulatory Visit: Payer: Self-pay | Admitting: Family Medicine

## 2021-01-23 DIAGNOSIS — I10 Essential (primary) hypertension: Secondary | ICD-10-CM

## 2021-02-06 DIAGNOSIS — M3500 Sicca syndrome, unspecified: Secondary | ICD-10-CM | POA: Diagnosis not present

## 2021-02-06 DIAGNOSIS — E669 Obesity, unspecified: Secondary | ICD-10-CM | POA: Diagnosis not present

## 2021-02-06 DIAGNOSIS — M25562 Pain in left knee: Secondary | ICD-10-CM | POA: Diagnosis not present

## 2021-02-06 DIAGNOSIS — M1990 Unspecified osteoarthritis, unspecified site: Secondary | ICD-10-CM | POA: Diagnosis not present

## 2021-02-06 DIAGNOSIS — D649 Anemia, unspecified: Secondary | ICD-10-CM | POA: Diagnosis not present

## 2021-02-06 DIAGNOSIS — L932 Other local lupus erythematosus: Secondary | ICD-10-CM | POA: Diagnosis not present

## 2021-02-06 DIAGNOSIS — E790 Hyperuricemia without signs of inflammatory arthritis and tophaceous disease: Secondary | ICD-10-CM | POA: Diagnosis not present

## 2021-03-11 ENCOUNTER — Telehealth: Payer: Self-pay

## 2021-03-11 NOTE — Telephone Encounter (Signed)
Patient contacted to schedule mammogram.  ? ?RE: Mobile Mammo event located at: ? ?[facility]  [facility]  ?      ?[address]    ? [place] [address]    ? ?Date: April 7th  ? ? ?

## 2021-03-18 ENCOUNTER — Ambulatory Visit (INDEPENDENT_AMBULATORY_CARE_PROVIDER_SITE_OTHER): Payer: Medicare Other | Admitting: Family Medicine

## 2021-03-18 ENCOUNTER — Ambulatory Visit (HOSPITAL_COMMUNITY)
Admission: RE | Admit: 2021-03-18 | Discharge: 2021-03-18 | Disposition: A | Discharge status: Home / Self Care | Payer: Medicare Other | Source: Ambulatory Visit | Attending: Family Medicine | Admitting: Family Medicine

## 2021-03-18 ENCOUNTER — Other Ambulatory Visit: Payer: Self-pay

## 2021-03-18 ENCOUNTER — Telehealth: Payer: Self-pay

## 2021-03-18 VITALS — BP 155/65 | HR 71 | Ht 63.0 in | Wt 213.2 lb

## 2021-03-18 DIAGNOSIS — I700 Atherosclerosis of aorta: Secondary | ICD-10-CM | POA: Diagnosis not present

## 2021-03-18 DIAGNOSIS — R1031 Right lower quadrant pain: Secondary | ICD-10-CM

## 2021-03-18 DIAGNOSIS — R109 Unspecified abdominal pain: Secondary | ICD-10-CM | POA: Diagnosis not present

## 2021-03-18 NOTE — Progress Notes (Signed)
? ? ? ?  SUBJECTIVE:  ? ?CHIEF COMPLAINT / HPI:  ? ?Jasmine Gregory is a 71 y.o. female presents for abdominal pain ? ?RLQ ?RLQ pain started on 11pm last night. Onset was sudden. Does not radiate. Does not know how to describe it. Intermittent pain lasts 5-10 mins. No exacerbating factors. Associated symptoms include diarrhea x1 and rectal bleeding x 4. Rectal bleeding is medium red color.she also endorses urinary frequency. Denies dysuria, urgency, hematuria and melena. Pt is not on anticoagulant, excess NSAIDs. No recent abx. No hx of renal colic. For the last week she has also been dizzy and nauseous. Pt reports very similar episode in LLQ in Sept 2022 and found to be colitis. Colonoscopy in Dec 23: polyp in descending colon and diverticula. Pain this time is not as bad as the previous episode..  ? ?[place] Office Visit from 03/18/2021 in [facility]  ?PHQ-9 Total Score 8  ? ?  ?  ? ?PERTINENT  PMH / PSH: HTN, rhinitis, GERD ? ?OBJECTIVE:  ? ?BP (!) 155/65   Pulse 71   Ht 5\' 3"  (1.6 m)   Wt 213 lb 3.2 oz (96.7 kg)   SpO2 100%   BMI 37.77 kg/m?   ? ?General: Alert, no acute distress, non-toxic appearing ?Cardio: Normal S1 and S2, RRR, no r/m/g ?Pulm: CTAB, normal work of breathing ?Abdomen: Bowel sounds normal. Abdomen soft, RLQ tenderness on palpation. No guarding. ?Extremities: No peripheral edema.  ?Neuro: Cranial nerves grossly intact  ? ?ASSESSMENT/PLAN:  ? ?RLQ abdominal pain ?RLQ pain, differentials including ischemic colitis, appendicitis and IBD. C.difficile less likely given no recent course of abx. Pt is significantly tender in RLQ but no guarding. She is non-toxic appearing and normal vitals which is reassuring. Pt does have hx of colitis last year, but the pain was LLQ at this time and was more severe. She was admitted to hospital for 2 days. The cause was unclear and she was recommended to have colonoscopy which showed a sessile polyp and multiple diverticula. Obtained  CT abdomen pelvis WO contrast, obtained CBC, CMP today. I will call pt with the results. Recommended f/u in 2 days with Dr Janus Molder. Strict ER precautions given to pt who expressed understanding. ?  ? ?Lattie Haw, MD PGY-3 ?[place]   ?

## 2021-03-18 NOTE — Telephone Encounter (Signed)
Patient calls nurse line with complaints of abdominal pain, nausea, frequent bowel movements and blood in stool. Patient reports that last night she was having frequent bowel movements that eventually transitioned to diarrhea. With last BM, she noticed approx 1 teaspoon to 1 tablespoon of medium red blood.  ? ?Reports slight lightheadedness and dizziness for the past week. Denies chest pain or SHOB.  ? ?Patient reports that she was hospitalized for colitis in September 2022 and symptoms are similar to when she had this diagnosis.  ? ?Precepted with Dr. Erin Hearing. Advised evaluation and that patient can be seen in [facility] clinic today. Scheduled patient for this AM at 1130. Patient will have family member drive her to this appointment.  ? ?Talbot Grumbling, RN ? ?

## 2021-03-18 NOTE — Patient Instructions (Signed)
Thank you for coming to see me today. It was a pleasure. You may have inflammation of the bowel again. Lets do a CT scan and labs. I will call you with the results if anything is abnormal. ? ?Dont take ibuprofen or aspirin. Take tylenol for the pain  ? ?Please follow-up with PCP in 2 days. If symptoms are worsening ie dizziness, worse blood loss or belly pains, go to the ED ? ?If you have any questions or concerns, please do not hesitate to call the office at [phone]. ? ?Best wishes,  ? ?Dr Posey Pronto   ?

## 2021-03-18 NOTE — Telephone Encounter (Signed)
Patient calls nurse line reporting she has been "waiting all day" for someone to tell her when to go for imaging.  ? ?Per chart review a STAT CT was ordered this morning, however has not been scheduled.  ? ?CT scheduled for this evening. Patient to arrive at [facility] at 430p.  ? ?Patent aware.  ?

## 2021-03-19 ENCOUNTER — Telehealth: Payer: Self-pay | Admitting: Family Medicine

## 2021-03-19 ENCOUNTER — Telehealth: Payer: Self-pay

## 2021-03-19 ENCOUNTER — Telehealth: Payer: Self-pay | Admitting: Gastroenterology

## 2021-03-19 DIAGNOSIS — R1031 Right lower quadrant pain: Secondary | ICD-10-CM | POA: Insufficient documentation

## 2021-03-19 LAB — COMPREHENSIVE METABOLIC PANEL
ALT: 13 IU/L (ref 0–32)
AST: 21 IU/L (ref 0–40)
Albumin/Globulin Ratio: 1.3 (ref 1.2–2.2)
Albumin: 4.4 g/dL (ref 3.7–4.7)
Alkaline Phosphatase: 78 IU/L (ref 44–121)
BUN/Creatinine Ratio: 14 (ref 12–28)
BUN: 14 mg/dL (ref 8–27)
Bilirubin Total: 0.2 mg/dL (ref 0.0–1.2)
CO2: 23 mmol/L (ref 20–29)
Calcium: 10.1 mg/dL (ref 8.7–10.3)
Chloride: 97 mmol/L (ref 96–106)
Creatinine, Ser: 0.99 mg/dL (ref 0.57–1.00)
Globulin, Total: 3.5 g/dL (ref 1.5–4.5)
Glucose: 135 mg/dL — ABNORMAL HIGH (ref 70–99)
Potassium: 3.8 mmol/L (ref 3.5–5.2)
Sodium: 140 mmol/L (ref 134–144)
Total Protein: 7.9 g/dL (ref 6.0–8.5)
eGFR: 61 mL/min/{1.73_m2} (ref >59)

## 2021-03-19 LAB — CBC
Hematocrit: 34.2 % (ref 34.0–46.6)
Hemoglobin: 10.7 g/dL — ABNORMAL LOW (ref 11.1–15.9)
MCH: 27 pg (ref 26.6–33.0)
MCHC: 31.3 g/dL — ABNORMAL LOW (ref 31.5–35.7)
MCV: 86 fL (ref 79–97)
Platelets: 200 10*3/uL (ref 150–450)
RBC: 3.97 x10E6/uL (ref 3.77–5.28)
RDW: 12.5 % (ref 11.7–15.4)
WBC: 6.8 10*3/uL (ref 3.4–10.8)

## 2021-03-19 NOTE — Telephone Encounter (Signed)
The pt states she had a follow up with her PCP  and told them she had some off/on BRBPR and occasional abd pain.  She was told to call our office and make an appt.  She states she has no bleeding or pain at this time.  She has been scheduled for appt to see Dr Ardis Hughs on 4/4 as follow up.  She will call back if she begins to have constant bleeding or worse abd pain.  ?

## 2021-03-19 NOTE — Telephone Encounter (Signed)
Patient called and stated that she is having abd pain and rectal bleeding. Seeking advice as to what she needs to do. Please advise.  ?

## 2021-03-19 NOTE — Telephone Encounter (Signed)
Called patient regarding recent CT abdomen pelvis and lab results and to follow-up on her symptoms.  She reports that she still having bloody diarrhea and does have abdominal pain but her symptoms are overall better compared to when she saw me in clinic.  I explained CT findings showing colitis of broad etiology such as infection, inflammation or ischemia.  Precepted patient with Dr. Erin Hearing who recommended watching and waiting approach given that previously patient had similar episode which resolved without full course of antibiotics when she was in hospital last year.  Also would like to avoid fluoroquinolones in elderly if possible.  Explained this to the patient, she is happy with the plan.  Recommended that she keeps to follow-up with Dr. Janus Molder tomorrow and also scheduled to follow-up with the GI doctor as she had a colonoscopy.  I will also message the GI doctor regarding the CT abdomen pelvis results for further advice.  Strict ER precautions given to patient. ?

## 2021-03-19 NOTE — Assessment & Plan Note (Addendum)
RLQ pain, differentials including ischemic colitis, appendicitis and IBD. C.difficile less likely given no recent course of abx. Pt is significantly tender in RLQ but no guarding. She is non-toxic appearing and normal vitals which is reassuring. Pt does have hx of colitis last year, but the pain was LLQ at this time and was more severe. She was admitted to hospital for 2 days. The cause was unclear and she was recommended to have colonoscopy which showed a sessile polyp and multiple diverticula. Obtained CT abdomen pelvis WO contrast, obtained CBC, CMP today. I will call pt with the results. Recommended f/u in 2 days with Dr Janus Molder. Strict ER precautions given to pt who expressed understanding. ?

## 2021-03-19 NOTE — Telephone Encounter (Signed)
Patient calls nurse line requesting results from CT scan.  ? ?Will forward to provider who saw patient. ?

## 2021-03-20 ENCOUNTER — Other Ambulatory Visit: Payer: Self-pay

## 2021-03-20 ENCOUNTER — Encounter: Payer: Self-pay | Admitting: Family Medicine

## 2021-03-20 ENCOUNTER — Ambulatory Visit (INDEPENDENT_AMBULATORY_CARE_PROVIDER_SITE_OTHER): Payer: Medicare Other | Admitting: Family Medicine

## 2021-03-20 VITALS — BP 134/64 | HR 81 | Wt 209.4 lb

## 2021-03-20 DIAGNOSIS — Z23 Encounter for immunization: Secondary | ICD-10-CM

## 2021-03-20 DIAGNOSIS — Z8719 Personal history of other diseases of the digestive system: Secondary | ICD-10-CM

## 2021-03-20 DIAGNOSIS — G629 Polyneuropathy, unspecified: Secondary | ICD-10-CM | POA: Diagnosis not present

## 2021-03-20 DIAGNOSIS — R1030 Lower abdominal pain, unspecified: Secondary | ICD-10-CM | POA: Diagnosis not present

## 2021-03-20 LAB — POCT UA - MICROSCOPIC ONLY

## 2021-03-20 LAB — POCT HEMOGLOBIN: Hemoglobin: 10.6 g/dL — AB (ref 11–14.6)

## 2021-03-20 MED ORDER — GABAPENTIN 600 MG PO TABS: 600.0000 mg | ORAL_TABLET | Freq: Every day | ORAL | 0 refills | Status: AC | PRN

## 2021-03-20 MED ORDER — SHINGRIX 50 MCG/0.5ML IM SUSR: 0.5000 mL | Freq: Once | INTRAMUSCULAR | 0 refills | Status: AC

## 2021-03-20 NOTE — Patient Instructions (Addendum)
It was wonderful to see you today. ? ?Please bring ALL of your medications with you to every visit.  ? ?Today we talked about: ? ?Return precautions for continued abdominal pain.  ?Follow up with gastroenterologist 04/25/21 ?Take script to pharmacy for shingles vaccine.  ?Schedule your mammogram ? ?Please be sure to schedule follow up at the front  desk before you leave today.  ? ?If you haven't already, sign up for My Chart to have easy access to your labs results, and communication with your primary care physician. ? ?Please call the clinic at [phone] if your symptoms worsen or you have any concerns. It was our pleasure to serve you. ? ?Dr. Janus Molder ? ?

## 2021-03-20 NOTE — Progress Notes (Signed)
? ? ?  SUBJECTIVE:  ? ?CHIEF COMPLAINT / HPI:  ? ?Follow up, blood in stool/abdominal pain ?Has not had any blood in stool since yesterday. Lower abdominal pain is improving. She has not taken anything for pain. She has follow up with GI 04/25/21. She denies fever, nausea/vomiting, diarrhea.  ? ?HM ?Scheduling mammogram ?Desires Flu vaccine ?Desires shingles vaccine ?Declines COVID booster- she is scared to get it at this time. She would like to do more reading/research on it  ? ?PERTINENT  PMH / PSH: HTN, Pernicious anemia ? ?OBJECTIVE:  ? ?BP 134/64   Pulse 81   Wt 209 lb 6.4 oz (95 kg)   SpO2 96%   BMI 37.09 kg/m?   ?Physical Exam ?Vitals reviewed.  ?Constitutional:   ?   General: She is not in acute distress. ?   Appearance: She is not ill-appearing, toxic-appearing or diaphoretic.  ?HENT:  ?   Head: Normocephalic.  ?Cardiovascular:  ?   Rate and Rhythm: Normal rate and regular rhythm.  ?   Heart sounds: Normal heart sounds.  ?Pulmonary:  ?   Effort: Pulmonary effort is normal. No respiratory distress.  ?   Breath sounds: No wheezing.  ?Abdominal:  ?   General: Bowel sounds are normal. There is no distension.  ?   Palpations: Abdomen is soft. There is no mass.  ?   Tenderness: There is abdominal tenderness. There is no right CVA tenderness, left CVA tenderness, guarding or rebound.  ?   Hernia: No hernia is present.  ?   Comments: Lower abdomen and suprapubic tenderness  ?Neurological:  ?   Mental Status: She is alert and oriented to person, place, and time.  ?Psychiatric:     ?   Mood and Affect: Mood normal.     ?   Behavior: Behavior normal.  ? ? ? ?ASSESSMENT/PLAN:  ? ?History of GI bleed  Lower abdominal pain ?Follow up visit. Hx of RLQ abdominal pain with diarrhea, urinary frequency, and rectal bleeding. Reviewed note from  3/1. CT abd/pelvis reviewed and remarkable for colitis. UTD on colon cancer screening. Today abdominal pain has improved but seems to be more suprapubic and continue to have urinary  frequency. No fever or CVA tenderness at this time. Will repeat hgb (likely stable if no continued bleeding) and obtain UA + culture (concern for UTI). Return precautions given if symptoms fail to improve or worsen. Will treat for UTI if culture positive.  ? ?Need for immunization against influenza ?- Flu Vaccine QUAD High Dose(Fluad) ? ?Marlen Koman Autry-Lott, DO ?[place]  ?

## 2021-04-09 ENCOUNTER — Other Ambulatory Visit: Payer: Self-pay | Admitting: Family Medicine

## 2021-04-09 DIAGNOSIS — E785 Hyperlipidemia, unspecified: Secondary | ICD-10-CM

## 2021-04-21 ENCOUNTER — Ambulatory Visit (INDEPENDENT_AMBULATORY_CARE_PROVIDER_SITE_OTHER): Payer: Medicare Other | Admitting: Gastroenterology

## 2021-04-21 ENCOUNTER — Encounter: Payer: Self-pay | Admitting: Gastroenterology

## 2021-04-21 VITALS — BP 110/68 | HR 76 | Ht 63.0 in | Wt 208.0 lb

## 2021-04-21 DIAGNOSIS — K5790 Diverticulosis of intestine, part unspecified, without perforation or abscess without bleeding: Secondary | ICD-10-CM

## 2021-04-21 DIAGNOSIS — K529 Noninfective gastroenteritis and colitis, unspecified: Secondary | ICD-10-CM

## 2021-04-21 MED ORDER — MESALAMINE 1.2 G PO TBEC: 4.8000 g | DELAYED_RELEASE_TABLET | Freq: Every day | ORAL | 11 refills | Status: DC

## 2021-04-21 NOTE — Patient Instructions (Signed)
If you are age 73 or older, your body mass index should be between 23-30. Your Body mass index is 36.85 kg/m?Marland Kitchen If this is out of the aforementioned range listed, please consider follow up with your Primary Care Provider. ?________________________________________________________ ? ?The  GI providers would like to encourage you to use [facility] to communicate with providers for non-urgent requests or questions.  Due to long hold times on the telephone, sending your provider a message by [facility] may be a faster and more efficient way to get a response.  Please allow 48 business hours for a response.  Please remember that this is for non-urgent requests.  ?_______________________________________________________ ? ?We have sent the following medications to your pharmacy for you to pick up at your convenience: ? ?START: Lialda 1.2 grams take 4 tablets daily. ? ?You follow up in our office in 4 months (August 2023).  We will contact you to schedule this appointment. ? ?Thank you for entrusting me with your care and choosing [facility]. ? ?Dr Ardis Hughs ? ?

## 2021-04-21 NOTE — Progress Notes (Signed)
Review of pertinent gastrointestinal problems: ?1.  History of precancerous colon polyps.  Colonoscopy February 2018 for routine risk screening found a single subcentimeter sessile serrated polyp.  Her colon was also quite tortuous in the sigmoid which related to diverticular related changes.  Required pediatric colonoscope to complete.  Colonoscopy 12/2020 found a single subcentimeter hyperplastic polyp, also significant left-sided diverticular disease with edema, luminal narrowing, tortuosity. ?2.  Likely mild ischemic colitis September 2022, 2-day hospital admission, presented with left-sided abdominal pain, bloody diarrhea. CT scan abdomen pelvis without IV contrast September 2022, indication acute left abdominal and pelvic pain" findings diffuse circumferential wall thickening of the descending colon with mild adjacent inflammation compatible with colitis.  No evidence of bowel obstruction or pneumoperitoneum or focal collection".  Symptoms resolved fairly quickly. ? ?HPI: ?This is a very pleasant 71 year old woman whom I last saw the time of a colonoscopy about 4 months ago.  She the results summarized above.  She had another "event".  She presents with lower abdominal pain, predominantly on the right side and also bloody diarrhea.  This event lasted for 4 or 5 days.  By the time she saw her primary care physician about a week later her symptoms had completely resolved.  This is the second such event that she has had. ? ?Overall her weight has been stable.  No fevers or chills.  Between these events she has a bowel movement every day or every other day without much fanfair. ? ? ?ROS: complete GI ROS as described in HPI, all other review negative. ? ?Constitutional:  No unintentional weight loss ? ? ?Past Medical History:  ?Diagnosis Date  ? Allergy   ? Anemia   ? Anxiety   ? Arthritis   ? Depression   ? Diverticulosis   ? Environmental and seasonal allergies   ? GERD (gastroesophageal reflux disease)   ?  Hemorrhoids   ? History of blood transfusion   ? Hyperlipidemia   ? Hypertension   ? Hypovitaminosis D 07/20/2011  ? Vitamin D was 17 in May 2012. Patient has not taken any Vitamin D supplement since that time.     ? Murmur, heart   ? Obesity   ? Vertigo   ? Vitamin B 12 deficiency 07/20/2011  ? Vitamin B12 was low at 169 in May 2012. No evidence of supplementation since that time.   ? ? ?Past Surgical History:  ?Procedure Laterality Date  ? ABDOMINAL HYSTERECTOMY    ? ? ?Current Outpatient Medications  ?Medication Instructions  ? acetaminophen (TYLENOL) 500 mg, Oral, 3 times daily  ? aspirin EC 81 mg, Oral, Daily, Swallow whole.  ? cetirizine (ZYRTEC) 10 mg, Oral, Daily PRN  ? Cholecalciferol (VITAMIN D PO) 1 tablet, Oral, Daily  ? Cyanocobalamin (VITAMIN B-12 PO) 1 tablet, Oral, Daily  ? ferrous sulfate 325 mg, Oral, Daily with breakfast  ? gabapentin (NEURONTIN) 600 mg, Oral, Daily PRN  ? lisinopril-hydrochlorothiazide (ZESTORETIC) 20-12.5 MG tablet Take 2 tablets by mouth once daily  ? naproxen sodium (ALEVE) 220 mg, Oral, 2 times daily PRN  ? simvastatin (ZOCOR) 40 MG tablet TAKE 1 TABLET BY MOUTH AT BEDTIME  ? ? ?Allergies as of 04/21/2021  ? (No Known Allergies)  ? ? ?Family History  ?Problem Relation Age of Onset  ? Melanoma Mother   ? Breast cancer Maternal Aunt   ? Colon cancer Maternal Uncle   ? Prostate cancer Maternal Uncle   ?     x 2  ? Esophageal cancer  Neg Hx   ? Pancreatic cancer Neg Hx   ? Stomach cancer Neg Hx   ? Liver disease Neg Hx   ? Rectal cancer Neg Hx   ? ? ?Social History  ? ?Socioeconomic History  ? Marital status: Single  ?  Spouse name: Not on file  ? Number of children: 2  ? Years of education: Not on file  ? Highest education level: Not on file  ?Occupational History  ? Not on file  ?Tobacco Use  ? Smoking status: Never  ? Smokeless tobacco: Never  ?Vaping Use  ? Vaping Use: Never used  ?Substance and Sexual Activity  ? Alcohol use: No  ? Drug use: No  ? Sexual activity: Not  Currently  ?  Birth control/protection: Surgical  ?Other Topics Concern  ? Not on file  ?Social History Narrative  ? Not on file  ? ?Social Determinants of Health  ? ?Financial Resource Strain: Not on file  ?Food Insecurity: Not on file  ?Transportation Needs: Not on file  ?Physical Activity: Not on file  ?Stress: Not on file  ?Social Connections: Not on file  ?Intimate Partner Violence: Not on file  ? ? ? ?Physical Exam: ?BP 110/68   Pulse 76   Ht '5\' 3"'$  (1.6 m)   Wt 208 lb (94.3 kg)   BMI 36.85 kg/m?  ?Constitutional: generally well-appearing ?Psychiatric: alert and oriented x3 ?Abdomen: soft, nontender, nondistended, no obvious ascites, no peritoneal signs, normal bowel sounds ?No peripheral edema noted in lower extremities ? ?Assessment and plan: ?71 y.o. female with intermittent lower abdominal pains bloody diarrhea ? ?I think it is possible that she is having diverticulosis associated problems.  Colonoscopy 3 to 4 months ago showed significant diverticular disease in the left side causing edema, luminal narrowing and tortuosity.  She certainly might have diverticulosis associated colitis and I recommended we try her on mesalamine orally.  I am prescribing Lialda 4 pills once daily, she will return here to the office in 3 to 4 months and knows to call here sooner if she is having further difficulties. ? ?Please see the "Patient Instructions" section for addition details about the plan. ? ?Owens Loffler, MD ?[facility] ?04/21/2021, 10:08 AM ? ? ?Total time on date of encounter was 30 minutes (this included time spent preparing to see the patient reviewing records; obtaining and/or reviewing separately obtained history; performing a medically appropriate exam and/or evaluation; counseling and educating the patient and family if present; ordering medications, tests or procedures if applicable; and documenting clinical information in the health record). ? ? ? ? ? ? ?

## 2021-04-23 NOTE — Telephone Encounter (Signed)
Final attempt to schedule for mobile mammo ? ?

## 2021-04-25 ENCOUNTER — Other Ambulatory Visit: Payer: Self-pay | Admitting: Family Medicine

## 2021-04-25 DIAGNOSIS — I10 Essential (primary) hypertension: Secondary | ICD-10-CM

## 2021-06-05 ENCOUNTER — Encounter: Payer: Self-pay | Admitting: Family Medicine

## 2021-06-05 ENCOUNTER — Ambulatory Visit (INDEPENDENT_AMBULATORY_CARE_PROVIDER_SITE_OTHER): Payer: Medicare Other | Admitting: Family Medicine

## 2021-06-05 ENCOUNTER — Ambulatory Visit (HOSPITAL_COMMUNITY)
Admission: RE | Admit: 2021-06-05 | Discharge: 2021-06-05 | Disposition: A | Discharge status: Home / Self Care | Payer: Medicare Other | Source: Ambulatory Visit | Attending: Family Medicine | Admitting: Family Medicine

## 2021-06-05 VITALS — BP 124/72 | HR 88 | Ht 63.0 in | Wt 205.0 lb

## 2021-06-05 DIAGNOSIS — M79675 Pain in left toe(s): Secondary | ICD-10-CM

## 2021-06-05 NOTE — Progress Notes (Signed)
    SUBJECTIVE:   CHIEF COMPLAINT / HPI:   Swollen toe 2 weeks of left big toe pain and swelling. Denies noticing an insect bite or walking outside without shoes. She also denies a hx of gout. Does have a history of arthritis. She has not had fever or any other sick symptoms. Pain and swelling is localized to the toe joint.   PERTINENT  PMH / PSH: HTN, Polyarthropathy  OBJECTIVE:   BP 124/72   Pulse 88   Ht '5\' 3"'$  (1.6 m)   Wt 205 lb (93 kg)   SpO2 98%   BMI 36.31 kg/m   General: Appears well, no acute distress. Age appropriate. Respiratory: normal effort Extremities: Left 1st metatarsal is edematous and hyperpigmented with warmth. It is quite tender to palpation with decreased ROM of motion.  Skin: Warm and dry, no rashes noted Neuro: alert and oriented Psych: normal affect  ASSESSMENT/PLAN:   Great toe pain, left 2 weeks of pain and swelling of left 1st metatarsal. No prior hx of gout and has hx of OA. Concern for both at this time. No systemic symptons currently. Work up below and follow up/treatment pending results.  - DG Foot Complete Left; Future - Uric Acid - CBC  Gerlene Fee, DO [place]

## 2021-06-05 NOTE — Patient Instructions (Signed)
-   Today we saw you for left toe pain.  I am sending you for an x-ray.  When this is completed and results are in I will notify you.  In the meantime you can use ibuprofen and Tylenol for pain. - I will notify you when the labs are available as well. -Your blood pressure is well controlled continue current medications.

## 2021-06-06 LAB — CBC
Hematocrit: 32.6 % — ABNORMAL LOW (ref 34.0–46.6)
Hemoglobin: 10.6 g/dL — ABNORMAL LOW (ref 11.1–15.9)
MCH: 28.3 pg (ref 26.6–33.0)
MCHC: 32.5 g/dL (ref 31.5–35.7)
MCV: 87 fL (ref 79–97)
Platelets: 245 10*3/uL (ref 150–450)
RBC: 3.75 x10E6/uL — ABNORMAL LOW (ref 3.77–5.28)
RDW: 12.5 % (ref 11.7–15.4)
WBC: 5.8 10*3/uL (ref 3.4–10.8)

## 2021-06-06 LAB — URIC ACID: Uric Acid: 8.5 mg/dL — ABNORMAL HIGH (ref 3.1–7.9)

## 2021-06-08 ENCOUNTER — Telehealth: Payer: Self-pay

## 2021-06-08 DIAGNOSIS — M109 Gout, unspecified: Secondary | ICD-10-CM

## 2021-06-08 MED ORDER — COLCHICINE 0.6 MG PO TABS: ORAL_TABLET | ORAL | 0 refills | Status: DC

## 2021-06-08 NOTE — Telephone Encounter (Signed)
Patient calls nurse line requesting recent imaging results and plan.   Will forward to PCP.

## 2021-06-08 NOTE — Telephone Encounter (Signed)
Called to discuss results with patient.  Will treat for gout flare.  Instructed to follow-up in 1 to 2 weeks for repeat uric acid level and to discuss prophylactic treatment if needed.  She voiced understanding.  Gerlene Fee, DO 06/08/2021, 5:26 PM PGY-3, [place]

## 2021-06-23 ENCOUNTER — Encounter: Payer: Self-pay | Admitting: *Deleted

## 2021-06-26 ENCOUNTER — Ambulatory Visit (INDEPENDENT_AMBULATORY_CARE_PROVIDER_SITE_OTHER): Payer: Medicare Other | Admitting: Family Medicine

## 2021-06-26 ENCOUNTER — Encounter: Payer: Self-pay | Admitting: Family Medicine

## 2021-06-26 VITALS — BP 142/64 | HR 79 | Ht 63.0 in | Wt 204.8 lb

## 2021-06-26 DIAGNOSIS — F32A Depression, unspecified: Secondary | ICD-10-CM | POA: Diagnosis not present

## 2021-06-26 DIAGNOSIS — Z8739 Personal history of other diseases of the musculoskeletal system and connective tissue: Secondary | ICD-10-CM | POA: Diagnosis not present

## 2021-06-26 DIAGNOSIS — F419 Anxiety disorder, unspecified: Secondary | ICD-10-CM

## 2021-06-26 DIAGNOSIS — Z0000 Encounter for general adult medical examination without abnormal findings: Secondary | ICD-10-CM

## 2021-06-26 NOTE — Patient Instructions (Signed)
It was wonderful to see you today.  Please bring ALL of your medications with you to every visit.   Today we talked about:  -I will notify you when labs available -Consider joining activity groups -Schedule your mammogram   Please call the clinic at [phone] if your symptoms worsen or you have any concerns. It was our pleasure to serve you.  Dr. Janus Molder

## 2021-06-26 NOTE — Progress Notes (Unsigned)
    SUBJECTIVE:   CHIEF COMPLAINT / HPI:   Follow up, toe pain Completed treatment for gout. Toe pain has improved. Denies fever or new joint pain.   Low mood, hx of anxiety and depression States she is sleeping a lot and does not desire to do any activities. She has not attempted to join any groups or gyms. She has sought therapy in the past and does not desire therapy or medication at this time.   HM Needs to schedule mammogram.   PERTINENT  PMH / PSH: As above  OBJECTIVE:   BP (!) 142/64   Pulse 79   Ht '5\' 3"'$  (1.6 m)   Wt 204 lb 12.8 oz (92.9 kg)   SpO2 100%   BMI 36.28 kg/m   Physical Exam Vitals reviewed.  Constitutional:      General: She is not in acute distress.    Appearance: She is not ill-appearing, toxic-appearing or diaphoretic.  Pulmonary:     Effort: Pulmonary effort is normal.  Musculoskeletal:     Right foot: No swelling, deformity, tenderness or bony tenderness. Normal pulse.     Left foot: No swelling, deformity, tenderness or bony tenderness. Normal pulse.  Neurological:     Mental Status: She is alert and oriented to person, place, and time.  Psychiatric:        Mood and Affect: Mood normal.        Behavior: Behavior normal.    ASSESSMENT/PLAN:   1. History of gout Completed treatment. No hx of prior gout, question whether true gout. Prophylaxis likely not needed.  - Uric Acid  2. Anxiety and depression Does not desire therapy or medication. Discussed joining senior groups. Encouraged follow up or mychart message to discuss further when desired.   3. Healthcare maintenance Plans to schedule mammogram. Office number offered, patient states she has it.   Jasmine Gregory, [place]

## 2021-06-27 LAB — URIC ACID: Uric Acid: 7.8 mg/dL (ref 3.1–7.9)

## 2021-07-01 ENCOUNTER — Telehealth: Payer: Self-pay | Admitting: Family Medicine

## 2021-07-01 NOTE — Telephone Encounter (Signed)
Discussed allopurinol with patient and she has decided not to start at this time.  Discussed we will need to start prophylactic treatment if a second attack occurs.  She voiced understanding.

## 2021-07-09 ENCOUNTER — Other Ambulatory Visit: Payer: Self-pay | Admitting: Family Medicine

## 2021-07-09 DIAGNOSIS — E785 Hyperlipidemia, unspecified: Secondary | ICD-10-CM

## 2021-07-27 ENCOUNTER — Other Ambulatory Visit: Payer: Self-pay | Admitting: Family Medicine

## 2021-07-27 DIAGNOSIS — I10 Essential (primary) hypertension: Secondary | ICD-10-CM

## 2021-08-11 DIAGNOSIS — E669 Obesity, unspecified: Secondary | ICD-10-CM | POA: Diagnosis not present

## 2021-08-11 DIAGNOSIS — M109 Gout, unspecified: Secondary | ICD-10-CM | POA: Diagnosis not present

## 2021-08-11 DIAGNOSIS — L932 Other local lupus erythematosus: Secondary | ICD-10-CM | POA: Diagnosis not present

## 2021-08-11 DIAGNOSIS — M3500 Sicca syndrome, unspecified: Secondary | ICD-10-CM | POA: Diagnosis not present

## 2021-08-11 DIAGNOSIS — M25562 Pain in left knee: Secondary | ICD-10-CM | POA: Diagnosis not present

## 2021-08-11 DIAGNOSIS — M1990 Unspecified osteoarthritis, unspecified site: Secondary | ICD-10-CM | POA: Diagnosis not present

## 2021-08-11 DIAGNOSIS — D649 Anemia, unspecified: Secondary | ICD-10-CM | POA: Diagnosis not present

## 2021-08-31 ENCOUNTER — Telehealth: Payer: Self-pay

## 2021-08-31 NOTE — Telephone Encounter (Signed)
Patient LVM on nurse line requesting medication for gout.   Called patient to gather more information. She did not answer, LVM for patient to return call to office to discuss in more detail.   Talbot Grumbling, RN

## 2021-09-01 NOTE — Telephone Encounter (Signed)
Patient returns calls to nurse line.   Patient reports left hand pointer finger is swollen and warm. Patient reports hx of gout and was told to call if another flare occurs.   Colchicine was given to patient back in ~ June.   We have no apts at this time this week.   Will forward to PCP.   Precautions discussed.

## 2021-09-02 ENCOUNTER — Ambulatory Visit (INDEPENDENT_AMBULATORY_CARE_PROVIDER_SITE_OTHER): Payer: Medicare Other | Admitting: Family Medicine

## 2021-09-02 VITALS — BP 125/63 | HR 79 | Ht 63.0 in | Wt 199.8 lb

## 2021-09-02 DIAGNOSIS — I10 Essential (primary) hypertension: Secondary | ICD-10-CM | POA: Diagnosis not present

## 2021-09-02 DIAGNOSIS — M7989 Other specified soft tissue disorders: Secondary | ICD-10-CM

## 2021-09-02 MED ORDER — COLCHICINE 0.6 MG PO TABS: 0.6000 mg | ORAL_TABLET | Freq: Two times a day (BID) | ORAL | 0 refills | Status: DC

## 2021-09-02 MED ORDER — LOSARTAN POTASSIUM 50 MG PO TABS: 50.0000 mg | ORAL_TABLET | Freq: Every day | ORAL | 3 refills | Status: DC

## 2021-09-02 NOTE — Patient Instructions (Signed)
It was great seeing you today!  Today we discussed your finger swelling, I think this is due to gout. Please take colchicine 0.6 mg twice daily for 7 days. You may also take ibuprofen for the pain.   Please stop taking the lisinopril-hydrochlorothiazide. This may be causing your gout. Instead please take losartan 50 mg daily. This can help both your blood pressure and gout. If you feel dizzy or weak then please call us.   Please follow up at your next scheduled appointment in 2 weeks, if anything arises between now and then, please don't hesitate to contact our office.   Thank you for allowing [place] to be a part of your medical care!  Thank you, Dr. Larae Grooms

## 2021-09-02 NOTE — Progress Notes (Unsigned)
    SUBJECTIVE:   CHIEF COMPLAINT / HPI:   Patient presents with swelling and aching of her left index finger that started 4 days ago. Endorses associated pain as well. She has a history of gout, denies any daily maintenance medications. About 2 months ago she had a similar swelling in her left big toe. Denies any fever, chills, vomiting, cough, congestion or other symptoms. Denies any trauma.   PERTINENT  PMH / PSH:   History of hypertension, takes lisinopril-HCTZ. She has been taking this medication for years but shares that she had her first gout flare-up about a little over 2 months ago. Denies any symptoms. BP has been controlled at home.   OBJECTIVE:   BP 125/63   Pulse 79   Ht '5\' 3"'$  (1.6 m)   Wt 199 lb 12.8 oz (90.6 kg)   SpO2 100%   BMI 35.39 kg/m   General: Patient well-appearing, in no acute distress. CV: RRR, no murmurs or gallops auscultated Resp: CTAB, no wheezing, rales or rhonchi noted Ext: moderate edema without erythema of left index metacarpal with limited active ROM, full active ROM of remainder of left metacarpals, right metacarpals and wrists bilaterally    ASSESSMENT/PLAN:   Finger swelling -possibly due to gout as patient is on HCTZ, pending uric acid. Other differentials include cellulitis and trauma although very low concern. Reassuringly no systemic symptoms to indicate infectious etiology. No indication at this time given lack of trauma -colchicine 0.6 mg bid for 7 days prescribed and instructed to take ibuprofen for the pain as appropriate -hypertension regimen adjusted to discontinue likely inciting agent and started losartan which can aid in improving gout, as below -plan to follow up in 2 weeks   HYPERTENSION, BENIGN ESSENTIAL -BP 125/63, well-controlled -discontinued lisinopril-HCTZ given gout flares, started losartan 50 mg -strict return precautions discussed -follow up in 2 weeks for BP check    -PHQ-9 score of 2 with negative question 9  reviewed.   Donney Dice, [place]

## 2021-09-03 DIAGNOSIS — M7989 Other specified soft tissue disorders: Secondary | ICD-10-CM

## 2021-09-03 HISTORY — DX: Other specified soft tissue disorders: M79.89

## 2021-09-03 LAB — URIC ACID: Uric Acid: 7.5 mg/dL (ref 3.1–7.9)

## 2021-09-03 NOTE — Assessment & Plan Note (Addendum)
-  possibly due to gout as patient is on HCTZ, pending uric acid. Other differentials include cellulitis and trauma although very low concern. Reassuringly no systemic symptoms to indicate infectious etiology. No indication at this time given lack of trauma -colchicine 0.6 mg bid for 7 days prescribed and instructed to take ibuprofen for the pain as appropriate -hypertension regimen adjusted to discontinue likely inciting agent and started losartan which can aid in improving gout, as below -plan to follow up in 2 weeks

## 2021-09-03 NOTE — Assessment & Plan Note (Signed)
-  BP 125/63, well-controlled -discontinued lisinopril-HCTZ given gout flares, started losartan 50 mg -strict return precautions discussed -follow up in 2 weeks for BP check

## 2021-09-07 ENCOUNTER — Other Ambulatory Visit: Payer: Self-pay | Admitting: Family Medicine

## 2021-09-07 ENCOUNTER — Telehealth: Payer: Self-pay | Admitting: *Deleted

## 2021-09-07 DIAGNOSIS — I10 Essential (primary) hypertension: Secondary | ICD-10-CM

## 2021-09-07 MED ORDER — LOSARTAN POTASSIUM 50 MG PO TABS: 50.0000 mg | ORAL_TABLET | Freq: Every day | ORAL | 3 refills | Status: DC

## 2021-09-07 NOTE — Telephone Encounter (Signed)
Patient left a message on referral line stating that she has been taking colchine 2x a day and it has been causing diarrhea.  She has started taking the medication once a day and this has helped ease that issue.  She would like to discuss the bp medication (pharmacy said it wasn't received) but she said that she has so much of the previous medication she wants to discuss taking it again.  Will forward to Dr. Larae Grooms who prescribed these medications for her.  Adysen Raphael,CMA

## 2021-09-14 ENCOUNTER — Ambulatory Visit: Payer: Medicare Other | Admitting: Gastroenterology

## 2021-09-14 ENCOUNTER — Ambulatory Visit (INDEPENDENT_AMBULATORY_CARE_PROVIDER_SITE_OTHER): Payer: Medicare Other | Admitting: Family Medicine

## 2021-09-14 VITALS — BP 149/75 | HR 68 | Ht 63.0 in | Wt 203.8 lb

## 2021-09-14 DIAGNOSIS — M7989 Other specified soft tissue disorders: Secondary | ICD-10-CM

## 2021-09-14 MED ORDER — COLCHICINE 0.6 MG PO TABS: 0.6000 mg | ORAL_TABLET | Freq: Every day | ORAL | 0 refills | Status: DC

## 2021-09-14 NOTE — Progress Notes (Signed)
    SUBJECTIVE:   CHIEF COMPLAINT / HPI:   Left index finger pain/swelling - Diagnosed as gout previously, has had gout in the great toe before - Did not tolerate twice daily dosing of colchicine so is now taking once daily - Has had minimal improvement in symptoms, has taken for <1 week - Has discomfort and swelling now also present in the ring finger laterally - Denies any fevers or systemic symptoms  - Does have some myalgias present  PERTINENT  PMH / PSH: Reviewed  OBJECTIVE:   BP (!) 149/75   Pulse 68   Ht '5\' 3"'$  (1.6 m)   Wt 203 lb 12.8 oz (92.4 kg)   SpO2 100%   BMI 36.10 kg/m   General: NAD, well-appearing, well-nourished Respiratory: No respiratory distress, breathing comfortably, able to speak in full sentences Skin: warm and dry, no rashes noted on exposed skin Psych: Appropriate affect and mood Left hand: left index finger swollen at the PIP, inability to flex the finger. Mild swelling of the lateral aspect of the left pinky finger. Both tender to palpation.   ASSESSMENT/PLAN:   Finger swelling New swelling also present on lateral pinky finger.  Still possibly gout, but odd presentation and with new swelling occurring.  May need to consider other rheumatologic evaluation or infectious process if having new joints infected. - Colchicine course extended to once daily for 30 days - Strict instructions to follow-up if new or joint swelling or if worsening or fevers - Follow-up in 1 week - At follow-up still having symptoms, can consider steroids - Given myalgias symptoms, recommended that patient pause simvastatin while taking colchicine     Rise Patience, DO [place]

## 2021-09-14 NOTE — Patient Instructions (Addendum)
It is possible that the swelling is still related to gout.  It is odd that you are having another site of swelling.  We are going to go ahead and extend your course of taking the colchicine medication for 30 days total.  I want you to follow-up in 1 week.  If you have any new joints that are swelling, fevers, malaise/fatigue or chills then I want you to come in sooner.  If no improvement by the next visit, we may need to consider another medication such as steroids.  Your blood pressure was still little bit elevated in the office today.  I want you to check your blood pressure at least once per day at home and keep a log of the numbers.  Bring them to your next visit.   If you are still having soreness in your joints or muscles you can pause your simvistatin while you are taking the colchicine.

## 2021-09-14 NOTE — Assessment & Plan Note (Addendum)
New swelling also present on lateral pinky finger.  Still possibly gout, but odd presentation and with new swelling occurring.  May need to consider other rheumatologic evaluation or infectious process if having new joints infected. - Colchicine course extended to once daily for 30 days - Strict instructions to follow-up if new or joint swelling or if worsening or fevers - Follow-up in 1 week - At follow-up still having symptoms, can consider steroids - Given myalgias symptoms, recommended that patient pause simvastatin while taking colchicine

## 2021-09-23 ENCOUNTER — Ambulatory Visit (INDEPENDENT_AMBULATORY_CARE_PROVIDER_SITE_OTHER): Payer: Medicare Other | Admitting: Family Medicine

## 2021-09-23 ENCOUNTER — Encounter: Payer: Self-pay | Admitting: Family Medicine

## 2021-09-23 VITALS — BP 168/67 | HR 81 | Ht 63.0 in | Wt 203.0 lb

## 2021-09-23 DIAGNOSIS — M7989 Other specified soft tissue disorders: Secondary | ICD-10-CM | POA: Diagnosis not present

## 2021-09-23 DIAGNOSIS — I10 Essential (primary) hypertension: Secondary | ICD-10-CM

## 2021-09-23 MED ORDER — LOSARTAN POTASSIUM 100 MG PO TABS: 100.0000 mg | ORAL_TABLET | Freq: Every day | ORAL | 3 refills | Status: DC

## 2021-09-23 NOTE — Progress Notes (Signed)
    SUBJECTIVE:   CHIEF COMPLAINT / HPI:   Patient presents for a follow up. Had gout in her finger and instructed to extend duration of colchicine and now feels better. She has noticed improved range of motion of all her fingers on her left hand along with resolved pain. Just experiencing some soreness but the swelling has improved.   History of hypertension, previously on HCTZ. Due to gout flair, HCTZ was discontinued and losartan was started. She has been recording her BP at home, ranging systolic 836-629 and diastolic 47-65Y. Denies chest pain, dyspnea, leg swelling and vision changes.   OBJECTIVE:   BP (!) 168/67   Pulse 81   Ht '5\' 3"'$  (1.6 m)   Wt 203 lb (92.1 kg)   SpO2 100%   BMI 35.96 kg/m   General: Patient well-appearing, in no acute distress. CV: RRR, no murmurs or gallops auscultated Resp: CTAB, no wheezing, rales or rhonchi noted MSK: edema of left index finger but improved since my prior visit with her, presence of active ROM with minimal limitations of flexion of left index finger, no erythema noted  Ext: no LE edema noted bilaterally   ASSESSMENT/PLAN:   HYPERTENSION, BENIGN ESSENTIAL -BP 183/64, on repeat 168/67 which is not at goal of <140/90. BP seems consistently not at goal during this clinic visit, the last visit and at home  -increased losartan to 100 mg daily -pending BMP to monitor electrolytes and renal function  -instructed to continue to maintain a BP log and bring to next visit -strict ED precautions discussed  -follow up in 2 weeks for BP check, plan to obtain another BMP at that time to further monitor potassium and renal function with recent increase in losartan dose   Finger swelling -improving -continue colchicine  -continue to hold statin, consider restarting at next visit   -PHQ-9 score of 0 reviewed.     Donney Dice, [place]

## 2021-09-23 NOTE — Assessment & Plan Note (Addendum)
-  BP 183/64, on repeat 168/67 which is not at goal of <140/90. BP seems consistently not at goal during this clinic visit, the last visit and at home  -increased losartan to 100 mg daily -pending BMP to monitor electrolytes and renal function  -instructed to continue to maintain a BP log and bring to next visit -strict ED precautions discussed  -follow up in 2 weeks for BP check, plan to obtain another BMP at that time to further monitor potassium and renal function with recent increase in losartan dose

## 2021-09-23 NOTE — Assessment & Plan Note (Signed)
-  improving -continue colchicine  -continue to hold statin, consider restarting at next visit

## 2021-09-23 NOTE — Patient Instructions (Addendum)
It was great seeing you today!  Today we discussed your gout and blood pressure. Your gout seems to be improved, continue taking the colchicine for a total of 30 days.  Your blood pressure is still very high, this places you at increased risk of a stroke. I increased your losartan dose to 100 mg. Please take this daily. We will also get blood work to check your kidney function and potassium level. Please record your blood pressures and bring to your next visit.   If you experience vision changes, chest pain shortness of breath or weakness and a blood pressure with the top number above 160 then please go to the emergency department.   Please follow up at your next scheduled appointment in 2 weeks, if anything arises between now and then, please don't hesitate to contact our office.   Thank you for allowing [place] to be a part of your medical care!  Thank you, Dr. Larae Grooms

## 2021-09-24 LAB — BASIC METABOLIC PANEL
BUN/Creatinine Ratio: 12 (ref 12–28)
BUN: 12 mg/dL (ref 8–27)
CO2: 24 mmol/L (ref 20–29)
Calcium: 9.6 mg/dL (ref 8.7–10.3)
Chloride: 104 mmol/L (ref 96–106)
Creatinine, Ser: 0.97 mg/dL (ref 0.57–1.00)
Glucose: 125 mg/dL — ABNORMAL HIGH (ref 70–99)
Potassium: 3.8 mmol/L (ref 3.5–5.2)
Sodium: 142 mmol/L (ref 134–144)
eGFR: 62 mL/min/{1.73_m2} (ref >59)

## 2021-10-06 NOTE — Progress Notes (Unsigned)
10/07/2021 Jasmine Gregory [id number] February 25, 1950  Referring provider: Lowry Ram, MD Primary GI doctor:  Dr. Loletha Gregory (Dr. Ardis Gregory)  ASSESSMENT AND PLAN:   Assessment: 71 y.o. female here for assessment of the following: 1. Colitis   2. Gastroesophageal reflux disease without esophagitis   09/2020 CT abdomen pelvis without IV contrast for acute left abdominal and pelvic pain, diffuse wall thickening descending colon with mild adjacent inflammation compatible with colitis, likely mild ischemic colitis 12/2020 colonoscopy single subcentimeter hyperplastic polyp significant left-sided diverticular disease with edema, luminal narrowing and tortuosity Patient has no symptoms at this time, she is unable to tolerate mesalamine due to severe HA, added this to intolerance.   Plan: Patient with history of lkely segmental colitis associated with diverticulitis with associated intermittent hematochezia, lower abdominal cramping. -Unable to tolerate mesalamine due to headache.  - if symptoms return can consider budesonide or oral prednisone versus  repeat antibiotic for 1 month with long-term Cipro. - Further recommendations per Dr. Loletha Gregory.  -Ultimately if patient continues to have refractory SCAD will refer to general surgery  Patient instructed to call our office if any change in symptoms.  Follow up 6 months  History of Present Illness:  71 y.o. female  with a past medical history of hypertension, hyperlipidemia, B12 deficiency, GERD, hemorrhoids, history of diverticulosis and others listed below, returns to clinic today for evaluation of colitis.  02/2016 colonoscopy routine screening single subcentimeter sessile serrated polyp quite tortuous colon diverticulosis, required pediatric scope to complete 09/2020 CT abdomen pelvis without IV contrast for acute left abdominal and pelvic pain, diffuse wall thickening descending colon with mild adjacent inflammation compatible with colitis, likely  mild ischemic colitis 12/2020 colonoscopy single subcentimeter hyperplastic polyp significant left-sided diverticular disease with edema, luminal narrowing and tortuosity 04/21/2021 office visit Dr. Ardis Gregory with abdominal pain and bloody diarrhea, thought potentially be due to scad, did trial of oral mesalamine  Patient could not tolerate the mesalamine, took it for 1 week, stopped and then retried a week later, and the headaches returned due to headache and difficulty swallowing the pill.   She has been battling with gout, has been started on colchicine twice a day. Since starting this medication has had BM 2-3 x a day, formed or loose stools, denies hematochezia or AB pain.  No fever, chills but she can get very hot with movement, no SOB or discomfort with movement. She has vertigo with movement.   She  reports that she has never smoked. She has never used smokeless tobacco. She reports that she does not drink alcohol and does not use drugs. Her family history includes Breast cancer in her maternal aunt; Colon cancer in her maternal uncle; Melanoma in her mother; Prostate cancer in her maternal uncle.   Current Medications:    Current Outpatient Medications (Cardiovascular):    losartan (COZAAR) 100 MG tablet, Take 1 tablet (100 mg total) by mouth at bedtime.   simvastatin (ZOCOR) 40 MG tablet, TAKE 1 TABLET BY MOUTH AT BEDTIME  Current Outpatient Medications (Respiratory):    cetirizine (ZYRTEC) 10 MG tablet, Take 1 tablet (10 mg total) by mouth daily as needed. (Patient taking differently: Take 10 mg by mouth daily as needed for allergies (congestion).)  Current Outpatient Medications (Analgesics):    acetaminophen (TYLENOL) 500 MG tablet, Take 1 tablet (500 mg total) by mouth 3 (three) times daily. (Patient taking differently: Take 500 mg by mouth See admin instructions. Take 1 tablet (500 mg) by mouth every morning, may also  take 1 tablet (500 mg) every 6 hours as needed for pain)    aspirin EC 81 MG tablet, Take 81 mg by mouth daily. Swallow whole.   colchicine 0.6 MG tablet, Take 1 tablet (0.6 mg total) by mouth daily.   naproxen sodium (ALEVE) 220 MG tablet, Take 220 mg by mouth 2 (two) times daily as needed (pain).  Current Outpatient Medications (Hematological):    Cyanocobalamin (VITAMIN B-12 PO), Take 1 tablet by mouth daily.   ferrous sulfate 325 (65 FE) MG tablet, Take 325 mg by mouth daily with breakfast.  Current Outpatient Medications (Other):    Cholecalciferol (VITAMIN D PO), Take 1 tablet by mouth daily.   gabapentin (NEURONTIN) 600 MG tablet, Take 1 tablet (600 mg total) by mouth daily as needed (leg pain).  Surgical History:  She  has a past surgical history that includes Abdominal hysterectomy.  Current Medications, Allergies, Past Medical History, Past Surgical History, Family History and Social History were reviewed in [employer] record.  Physical Exam: BP (!) 152/86   Pulse 76   Ht '5\' 3"'$  (1.6 m)   Wt 200 lb 9.6 oz (91 kg)   SpO2 97%   BMI 35.53 kg/m  General:   Pleasant, well developed female in no acute distress Heart : Regular rate and rhythm; no murmurs Pulm: Clear anteriorly; no wheezing Abdomen:  Soft, Obese AB, Active bowel sounds. No tenderness . , No organomegaly appreciated. Rectal: Not evaluated Extremities:  without  edema. Neurologic:  Alert and  oriented x4;  No focal deficits.  Psych:  Cooperative. Normal mood and affect.   Vladimir Crofts, PA-C 10/07/21

## 2021-10-07 ENCOUNTER — Ambulatory Visit (INDEPENDENT_AMBULATORY_CARE_PROVIDER_SITE_OTHER): Payer: Medicare Other | Admitting: Physician Assistant

## 2021-10-07 ENCOUNTER — Encounter: Payer: Self-pay | Admitting: Physician Assistant

## 2021-10-07 ENCOUNTER — Telehealth: Payer: Self-pay

## 2021-10-07 VITALS — BP 152/86 | HR 76 | Ht 63.0 in | Wt 200.6 lb

## 2021-10-07 DIAGNOSIS — K219 Gastro-esophageal reflux disease without esophagitis: Secondary | ICD-10-CM | POA: Diagnosis not present

## 2021-10-07 DIAGNOSIS — K529 Noninfective gastroenteritis and colitis, unspecified: Secondary | ICD-10-CM | POA: Diagnosis not present

## 2021-10-07 NOTE — Patient Instructions (Addendum)
_______________________________________________________  If you are age 73 or older, your body mass index should be between 23-30. Your Body mass index is 35.53 kg/m. If this is out of the aforementioned range listed, please consider follow up with your Primary Care Provider.  The [facility] providers would like to encourage you to use [facility] to communicate with providers for non-urgent requests or questions.  Due to long hold times on the telephone, sending your provider a message by [facility] may be a faster and more efficient way to get a response.  Please allow 48 business hours for a response.  Please remember that this is for non-urgent requests.  _______________________________________________________   Follow up in 6 months.   Can talk with your PCP about allopurinol to prevent gout attacks.   If you start to have blood in the stool, abdominal pain or worsening bowel movements, call our office can do trial of a medication for you.   Diverticulosis Diverticulosis is a condition that develops when small pouches (diverticula) form in the wall of the large intestine (colon). The colon is where water is absorbed and stool (feces) is formed. The pouches form when the inside layer of the colon pushes through weak spots in the outer layers of the colon. You may have a few pouches or many of them. The pouches usually do not cause problems unless they become inflamed or infected. When this happens, the condition is called diverticulitis- this is left lower quadrant pain, diarrhea, fever, chills, nausea or vomiting.  If this occurs please call the office or go to the hospital. Sometimes these patches without inflammation can also have painless bleeding associated with them, if this happens please call the office or go to the hospital. Preventing constipation and increasing fiber can help reduce diverticula and prevent complications. Even if you feel you have a high-fiber diet, suggest getting on  Benefiber or Cirtracel 2 times daily.

## 2021-10-07 NOTE — Telephone Encounter (Signed)
-----   Message from Vladimir Crofts, [place] sent at 10/07/2021  1:34 PM EDT -----  Discussed with Dr. Loletha Carrow, suggest taking she take colace 200 mg nightly and a half capful of MiraLAX every day. May be constipation leading to stress on colon and the bleeding.  Jasmine Gregory ----- Message ----- From: Doran Stabler, MD Sent: 10/07/2021  12:45 PM EDT To: Vladimir Crofts, PA-C     ----- Message ----- From: Delfina Redwood Sent: 10/07/2021  10:29 AM EDT To: Doran Stabler, MD

## 2021-10-07 NOTE — Progress Notes (Signed)
____________________________________________________________  Attending physician addendum:  Thank you for sending this case to me. I have reviewed the entire note and agree with the plan.  Have not previously seen or performed procedures on this patient, it is somewhat difficult for me to determine what has episodically occurred with her.  However, I think it is more likely to have been episodes of ischemic colitis and some segmental colitis related to the diverticulosis. Given her colonoscopy findings and degree of diverticulosis with significant luminal narrowing, I suspect she may be getting episodic constipation leading to ischemic colitis.  As such, I recommend she take colace 200 mg nightly and a half capful of MiraLAX every day.  Wilfrid Lund, MD  ____________________________________________________________

## 2021-10-07 NOTE — Telephone Encounter (Signed)
Spoke with patient regarding PA recommendations. Pt was able to verbalize & write down instructions. Advised she call back with any questions.

## 2021-10-19 ENCOUNTER — Other Ambulatory Visit: Payer: Self-pay | Admitting: Family Medicine

## 2021-10-19 DIAGNOSIS — M7989 Other specified soft tissue disorders: Secondary | ICD-10-CM

## 2021-10-21 ENCOUNTER — Telehealth: Payer: Self-pay

## 2021-10-21 NOTE — Patient Instructions (Signed)
Visit Information  Thank you for taking time to visit with me today. Please don't hesitate to contact me if I can be of assistance to you.   Following are the goals we discussed today:   Goals Addressed             This Visit's Progress    Maintain Health       Care Coordination Interventions: Patient interviewed about adult health maintenance status including  Blood Pressure    Scheduling annual wellness exam          Our next appointment is by telephone on 01/20/22 at 10am  Please call the care guide team at [phone] if you need to cancel or reschedule your appointment.   If you are experiencing a Mental Health or [place] or need someone to talk to, please call the Suicide and Crisis Lifeline: 988   The patient verbalized understanding of instructions, educational materials, and care plan provided today and agreed to receive a mailed copy of patient instructions, educational materials, and care plan.   Telephone follow up appointment with care management team member scheduled for:01/20/22  Jone Baseman, RN, MSN [facility] [facility] Care Management Coordinator Direct Line [phone]

## 2021-10-21 NOTE — Patient Outreach (Signed)
  Care Coordination   Initial Visit Note   10/21/2021 Name: Jasmine Gregory MRN: [id number] DOB: 11-Jan-1951  Jasmine Gregory is a 71 y.o. year old female who sees Lowry Ram, MD for primary care. I spoke with  Jasmine Gregory by phone today.  What matters to the patients health and wellness today?  Maintain Health     Goals Addressed             This Visit's Progress    Maintain Health       Care Coordination Interventions: Patient interviewed about adult health maintenance status including  Blood Pressure    Scheduling annual wellness exam          SDOH assessments and interventions completed:  Yes     Care Coordination Interventions Activated:  Yes  Care Coordination Interventions:  Yes, provided   Follow up plan: Follow up call scheduled for 01/20/22    Encounter Outcome:  Pt. Visit Completed   Jone Baseman, RN, MSN [place] [facility] Care Management Coordinator Direct Line [phone]

## 2021-10-29 ENCOUNTER — Ambulatory Visit (INDEPENDENT_AMBULATORY_CARE_PROVIDER_SITE_OTHER): Payer: Medicare Other | Admitting: Family Medicine

## 2021-10-29 ENCOUNTER — Encounter: Payer: Self-pay | Admitting: Family Medicine

## 2021-10-29 VITALS — BP 176/82 | HR 88 | Wt 197.0 lb

## 2021-10-29 DIAGNOSIS — I10 Essential (primary) hypertension: Secondary | ICD-10-CM

## 2021-10-29 DIAGNOSIS — M7989 Other specified soft tissue disorders: Secondary | ICD-10-CM | POA: Diagnosis not present

## 2021-10-29 DIAGNOSIS — M109 Gout, unspecified: Secondary | ICD-10-CM | POA: Insufficient documentation

## 2021-10-29 DIAGNOSIS — E785 Hyperlipidemia, unspecified: Secondary | ICD-10-CM

## 2021-10-29 DIAGNOSIS — Z23 Encounter for immunization: Secondary | ICD-10-CM

## 2021-10-29 MED ORDER — AMLODIPINE BESYLATE-VALSARTAN 5-160 MG PO TABS: 1.0000 | ORAL_TABLET | Freq: Every day | ORAL | 0 refills | Status: DC

## 2021-10-29 MED ORDER — SIMVASTATIN 40 MG PO TABS: 40.0000 mg | ORAL_TABLET | Freq: Every day | ORAL | 0 refills | Status: DC

## 2021-10-29 NOTE — Assessment & Plan Note (Signed)
Increased losartan last visit.

## 2021-10-29 NOTE — Patient Instructions (Signed)
It was great to see you today! Thank you for choosing [facility] for your primary care. Jasmine Gregory was seen for BP follow up .  Today we addressed: Blood pressure - we are going to start a medication called Exforge. Please take this medication and stop taking the losartan. I want to see you in 3-4 weeks to follow up on your blood pressure and the change in medication.  Please restart your simvastatin  Gout - Please continue taking colchicine and also take ibuprofen 400 twice daily for pain symptoms. We can start allopurinol when you are not in a flare.  Fevers - please check your temperature at home when you are having fevers.  Mammogram - You need a mammogram to prevent breast cancer.  Please schedule an appointment.  You can call [phone].     If you haven't already, sign up for My Chart to have easy access to your labs results, and communication with your primary care physician.  We are checking some labs today. If they are abnormal, I will call you. If they are normal, I will send you a MyChart message (if it is active) or a letter in the mail. If you do not hear about your labs in the next 2 weeks, please call the office.   You should return to our clinic Return in about 3 weeks (around 11/19/2021) for BP, gout, fevers.  I recommend that you always bring your medications to each appointment as this makes it easy to ensure you are on the correct medications and helps [place] not miss refills when you need them.  Please arrive 15 minutes before your appointment to ensure smooth check in process.  We appreciate your efforts in making this happen.  Please call the clinic at [phone] if your symptoms worsen or you have any concerns.  Thank you for allowing me to participate in your care, Lowry Ram, MD 10/29/2021, 3:47 PM PGY-1, [place]

## 2021-10-29 NOTE — Assessment & Plan Note (Signed)
Finger swelling and inability to bend index finger in line with previous hx and diagnosis of Gout. Does have hx of podagra. Currently taking colchicine during this flare. Uric acid has been normal. Most likely precipitated by taking HCTZ in past.  - Continue gout friendly diet  - Continue colchicine and start low dose NSAID during current flare  - Start allopurinol after end of flare  - F/u in 3 weeks

## 2021-10-29 NOTE — Progress Notes (Signed)
    SUBJECTIVE:   CHIEF COMPLAINT / HPI:   Blood pressure Has been taking 100 mg losartan. No side effects from increased dose. Does have a headache currently, but consistent with her sinus headaches. Has tried HCTZ in the past which caused gout. Does not want to start another pill as she feels like she is on many medications. Does walk a couple times a week. She will go to a store like [employer] and walk around.   Intermittent Fevers  Subjective fevers for the last month. Has not measured as her thermometer  She says she could not buy another one as she could not afford it. Feels like her head is sweating. No unintentional weight loss. Eating less in general due to restricting certain foods for gout. Still eating three times a day. Denies night sweats, Denies fatigue. Does not smoke, up to date on colonoscopy (got earlier this year). Due for mammogram.   Gout  Feels like she has been having a flare for the past month. In mainly her left pointer finger, which has started to improve, but now started on her left pinky finger. She says it is hard to bend her pointer finger, but she has been able to in the past. She has only been taking colchicine and tylenol during this flare.  PERTINENT  PMH / PSH: HTN  OBJECTIVE:   BP (!) 176/82   Pulse 88   Wt 197 lb (89.4 kg)   SpO2 98%   BMI 34.90 kg/m   General: Well appearing  CV: RRR, pulses equal and palpable  Resp: CTAB, diminished at bases  Abd: Soft, non tender, non distended  MSK: Both hands with enlargened PIP, unable to bend Left index finger PIP, no redness or warmth in enlargened joints. Gait- avoids fully bending joints as she walks  ASSESSMENT/PLAN:   HYPERTENSION, BENIGN ESSENTIAL Increased losartan last visit.    Finger swelling Finger swelling and inability to bend index finger in line with previous hx and diagnosis of Gout. Does have hx of podagra. Currently taking colchicine during this flare. Uric acid has been normal. Most  likely precipitated by taking HCTZ in past.  - Continue gout friendly diet  - Continue colchicine and start low dose NSAID during current flare  - Start allopurinol after end of flare  - F/u in 3 weeks    Intermittent Fevers  Most likely benign given normal temperature today. Negative B symptoms. No infectious symptoms currently.  - Ask patient to schedule mammogram  - Temperature diary   Health Maintenance  Offered resources to schedule Mammogram Flu shot today   Lowry Ram, MD [place]

## 2021-10-30 LAB — BASIC METABOLIC PANEL
BUN/Creatinine Ratio: 13 (ref 12–28)
BUN: 14 mg/dL (ref 8–27)
CO2: 27 mmol/L (ref 20–29)
Calcium: 9.7 mg/dL (ref 8.7–10.3)
Chloride: 101 mmol/L (ref 96–106)
Creatinine, Ser: 1.1 mg/dL — ABNORMAL HIGH (ref 0.57–1.00)
Glucose: 122 mg/dL — ABNORMAL HIGH (ref 70–99)
Potassium: 3.9 mmol/L (ref 3.5–5.2)
Sodium: 140 mmol/L (ref 134–144)
eGFR: 53 mL/min/{1.73_m2} — ABNORMAL LOW (ref >59)

## 2021-11-20 ENCOUNTER — Other Ambulatory Visit: Payer: Self-pay | Admitting: Family Medicine

## 2021-11-20 DIAGNOSIS — I10 Essential (primary) hypertension: Secondary | ICD-10-CM

## 2021-11-24 NOTE — Telephone Encounter (Signed)
Called patient to discuss HTN medication. Patient says she has been tolerating well. She was supposed to come in for a visit to follow up the change to [place]. She says she is doing well occassionally having some lightheadedness when running around and busy, but otherwise is doing well. Also has a lot going on as her son has colon cancer and was given one week to live. She will come in soon. I let her know I will send in another refill.

## 2021-11-30 ENCOUNTER — Other Ambulatory Visit: Payer: Self-pay | Admitting: *Deleted

## 2021-11-30 DIAGNOSIS — Z1231 Encounter for screening mammogram for malignant neoplasm of breast: Secondary | ICD-10-CM

## 2021-12-04 ENCOUNTER — Ambulatory Visit
Admission: RE | Admit: 2021-12-04 | Discharge: 2021-12-04 | Disposition: A | Discharge status: Home / Self Care | Payer: Medicare Other | Source: Ambulatory Visit | Attending: *Deleted | Admitting: *Deleted

## 2021-12-04 DIAGNOSIS — Z1231 Encounter for screening mammogram for malignant neoplasm of breast: Secondary | ICD-10-CM | POA: Diagnosis not present

## 2021-12-08 ENCOUNTER — Other Ambulatory Visit: Payer: Self-pay | Admitting: Family Medicine

## 2021-12-08 DIAGNOSIS — R928 Other abnormal and inconclusive findings on diagnostic imaging of breast: Secondary | ICD-10-CM

## 2021-12-17 ENCOUNTER — Other Ambulatory Visit: Payer: Self-pay | Admitting: Family Medicine

## 2021-12-17 DIAGNOSIS — I10 Essential (primary) hypertension: Secondary | ICD-10-CM

## 2021-12-23 ENCOUNTER — Ambulatory Visit
Admission: RE | Admit: 2021-12-23 | Discharge: 2021-12-23 | Disposition: A | Discharge status: Home / Self Care | Payer: Medicare Other | Source: Ambulatory Visit | Attending: Family Medicine | Admitting: Family Medicine

## 2021-12-23 ENCOUNTER — Other Ambulatory Visit: Payer: Self-pay | Admitting: Family Medicine

## 2021-12-23 DIAGNOSIS — R921 Mammographic calcification found on diagnostic imaging of breast: Secondary | ICD-10-CM | POA: Diagnosis not present

## 2021-12-23 DIAGNOSIS — R928 Other abnormal and inconclusive findings on diagnostic imaging of breast: Secondary | ICD-10-CM

## 2021-12-23 DIAGNOSIS — N6324 Unspecified lump in the left breast, lower inner quadrant: Secondary | ICD-10-CM | POA: Diagnosis not present

## 2021-12-23 DIAGNOSIS — N6320 Unspecified lump in the left breast, unspecified quadrant: Secondary | ICD-10-CM

## 2021-12-24 ENCOUNTER — Telehealth: Payer: Self-pay | Admitting: Family Medicine

## 2021-12-24 DIAGNOSIS — F4329 Adjustment disorder with other symptoms: Secondary | ICD-10-CM

## 2021-12-24 MED ORDER — BUSPIRONE HCL 5 MG PO TABS: 7.5000 mg | ORAL_TABLET | Freq: Two times a day (BID) | ORAL | Status: AC

## 2021-12-24 NOTE — Telephone Encounter (Signed)
Called patient to confirm that she was informed regarding mammogram and fibrocystic changes additionally 49-monthfollow-up.   She mentioned that she was very distraught regarding her son having terminal pancreatic cancer.  She asked if there was anything that I could give her for her nerves.  She said that she is having trouble sleeping at night.  Upon asking if she would like to talk to anyone regarding this situation she said that hospice care counseling is coming by couple times a week.  I mentioned that I could give her some buspirone that would be a once daily pill that would time to start working but would eventually help with nerves.  She was agreeable.

## 2022-01-17 ENCOUNTER — Other Ambulatory Visit: Payer: Self-pay | Admitting: Family Medicine

## 2022-01-17 DIAGNOSIS — I10 Essential (primary) hypertension: Secondary | ICD-10-CM

## 2022-01-20 ENCOUNTER — Ambulatory Visit: Payer: Self-pay

## 2022-01-20 NOTE — Patient Outreach (Signed)
  Care Coordination   01/20/2022 Name: DA MICHELLE MRN: [id number] DOB: 1950-07-29   Care Coordination Outreach Attempts:  An unsuccessful telephone outreach was attempted today to offer the patient information about available care coordination services as a benefit of their health plan.   Follow Up Plan:  Additional outreach attempts will be made to offer the patient care coordination information and services.   Encounter Outcome:  No Answer   Care Coordination Interventions:  No, not indicated    Jone Baseman, RN, MSN [place] [facility] Care Management Coordinator Direct Line [phone]

## 2022-01-21 NOTE — Telephone Encounter (Signed)
Attempted to call patient to ask about side effects of new hypertensive medication. Could not reach patient.

## 2022-02-10 ENCOUNTER — Ambulatory Visit: Payer: Self-pay

## 2022-02-10 NOTE — Patient Instructions (Signed)
Visit Information  Thank you for taking time to visit with me today. Please don't hesitate to contact me if I can be of assistance to you.   Following are the goals we discussed today:   Goals Addressed             This Visit's Progress    Maintain Health       Care Coordination Interventions: Patient interviewed about adult health maintenance status including  Blood Pressure          Not checking Blood pressure at this time.  Encouraged self care and physician follow up. AWV scheduled 02/22/22          Our next appointment is by telephone on 03/10/22 at 1100  Please call the care guide team at [phone] if you need to cancel or reschedule your appointment.   If you are experiencing a Mental Health or [place] or need someone to talk to, please call the Suicide and Crisis Lifeline: 988   Patient verbalizes understanding of instructions and care plan provided today and agrees to view in [place]. Active MyChart status and patient understanding of how to access instructions and care plan via MyChart confirmed with patient.     Telephone follow up appointment with care management team member scheduled for: February  Pheonix Clinkscale J Dalaya Suppa, RN, MSN [place] [facility] Care Management Coordinator Direct Line [phone]

## 2022-02-10 NOTE — Patient Outreach (Signed)
  Care Coordination   Follow Up Visit Note   02/10/2022 Name: Jasmine Gregory MRN: [id number] DOB: [birth year]  Jasmine Gregory is a 72 y.o. year old female who sees Lowry Ram, MD for primary care. I spoke with  Jasmine Gregory by phone today.  What matters to the patients health and wellness today?  Grieving loss of son in December.  Receiving counseling    Goals Addressed             This Visit's Progress    Maintain Health       Care Coordination Interventions: Patient interviewed about adult health maintenance status including  Blood Pressure          Not checking Blood pressure at this time.  Encouraged self care and physician follow up. AWV scheduled 02/22/22          SDOH assessments and interventions completed:  Yes  SDOH Interventions Today    Flowsheet Row Most Recent Value  SDOH Interventions   Food Insecurity Interventions Intervention Not Indicated  Transportation Interventions Intervention Not Indicated        Care Coordination Interventions:  Yes, provided   Follow up plan: Follow up call scheduled for February    Encounter Outcome:  Pt. Visit Completed   Jone Baseman, RN, MSN [place] [facility] Care Management Coordinator Direct Line [phone]

## 2022-02-15 DIAGNOSIS — M25562 Pain in left knee: Secondary | ICD-10-CM | POA: Diagnosis not present

## 2022-02-15 DIAGNOSIS — M3500 Sicca syndrome, unspecified: Secondary | ICD-10-CM | POA: Diagnosis not present

## 2022-02-15 DIAGNOSIS — L932 Other local lupus erythematosus: Secondary | ICD-10-CM | POA: Diagnosis not present

## 2022-02-15 DIAGNOSIS — M109 Gout, unspecified: Secondary | ICD-10-CM | POA: Diagnosis not present

## 2022-02-15 DIAGNOSIS — M1990 Unspecified osteoarthritis, unspecified site: Secondary | ICD-10-CM | POA: Diagnosis not present

## 2022-02-15 DIAGNOSIS — E669 Obesity, unspecified: Secondary | ICD-10-CM | POA: Diagnosis not present

## 2022-02-15 DIAGNOSIS — D649 Anemia, unspecified: Secondary | ICD-10-CM | POA: Diagnosis not present

## 2022-02-18 ENCOUNTER — Other Ambulatory Visit: Payer: Self-pay | Admitting: Family Medicine

## 2022-02-18 DIAGNOSIS — I10 Essential (primary) hypertension: Secondary | ICD-10-CM

## 2022-02-22 ENCOUNTER — Ambulatory Visit (INDEPENDENT_AMBULATORY_CARE_PROVIDER_SITE_OTHER): Payer: Medicare Other

## 2022-02-22 DIAGNOSIS — Z0000 Encounter for general adult medical examination without abnormal findings: Secondary | ICD-10-CM

## 2022-02-22 NOTE — Progress Notes (Signed)
I connected with  Aida Raider on 02/22/22 by a audio enabled telemedicine application and verified that I am speaking with the correct person using two identifiers.  Patient Location: Home  Provider Location: Office/Clinic  I discussed the limitations of evaluation and management by telemedicine. The patient expressed understanding and agreed to proceed.  Subjective:   Jasmine Gregory is a 72 y.o. female who presents for Medicare Annual (Subsequent) preventive examination.  Review of Systems    Per HPI unless specifically indicated below.  Cardiac Risk Factors include: advanced age (>91mn, >>80women);female gender        Objective:      10/29/2021    3:07 PM 10/07/2021   10:01 AM 09/23/2021    8:48 AM  Vitals with BMI  Height  '5\' 3"'$    Weight 197 lbs 200 lbs 10 oz   BMI  384.16  Systolic [id number] Diastolic 82 86 67  Pulse 88 76     Today's Vitals   02/22/22 1349  PainSc: 0-No pain   There is no height or weight on file to calculate BMI.     02/22/2022    6:18 PM 10/29/2021    3:07 PM 09/23/2021    8:34 AM 09/14/2021    8:32 AM 09/02/2021   10:58 AM 06/26/2021    2:30 PM 06/05/2021    2:24 PM  Advanced Directives  Does Patient Have a Medical Advance Directive? No No No No No No No  Would patient like information on creating a medical advance directive? Yes (MAU/Ambulatory/Procedural Areas - Information given) No - Patient declined No - Patient declined No - Patient declined No - Patient declined No - Patient declined No - Patient declined    Current Medications (verified) Outpatient Encounter Medications as of 02/22/2022  Medication Sig   acetaminophen (TYLENOL) 500 MG tablet Take 1 tablet (500 mg total) by mouth 3 (three) times daily. (Patient taking differently: Take 500 mg by mouth See admin instructions. Take 1 tablet (500 mg) by mouth every morning, may also take 1 tablet (500 mg) every 6 hours as needed for pain)   amLODipine-valsartan (EXFORGE) 5-160 MG tablet  Take 1 tablet by mouth once daily   aspirin EC 81 MG tablet Take 81 mg by mouth daily. Swallow whole.   cetirizine (ZYRTEC) 10 MG tablet Take 1 tablet (10 mg total) by mouth daily as needed. (Patient taking differently: Take 10 mg by mouth daily as needed for allergies (congestion).)   Cholecalciferol (VITAMIN D PO) Take 1 tablet by mouth daily.   colchicine 0.6 MG tablet Take 1 tablet by mouth once daily (Patient taking differently: Take 0.6 mg by mouth as needed.)   Cyanocobalamin (VITAMIN B-12 PO) Take 1 tablet by mouth daily.   ferrous sulfate 325 (65 FE) MG tablet Take 325 mg by mouth daily with breakfast.   gabapentin (NEURONTIN) 600 MG tablet Take 1 tablet (600 mg total) by mouth daily as needed (leg pain).   simvastatin (ZOCOR) 40 MG tablet Take 1 tablet (40 mg total) by mouth at bedtime.   [DISCONTINUED] simvastatin (ZOCOR) 40 MG tablet 1 tablet in the evening Orally Once a day   [DISCONTINUED] naproxen sodium (ALEVE) 220 MG tablet Take 220 mg by mouth 2 (two) times daily as needed (pain). (Patient not taking: Reported on 02/22/2022)   Facility-Administered Encounter Medications as of 02/22/2022  Medication   busPIRone (BUSPAR) tablet 7.5 mg    Allergies (verified) Mesalamine   History:  Past Medical History:  Diagnosis Date   Allergy    Anemia    Anxiety    Arthritis    Depression    Diverticulosis    Environmental and seasonal allergies    GERD (gastroesophageal reflux disease)    Gout 2023   Hemorrhoids    History of blood transfusion    Hyperlipidemia    Hypertension    Hypovitaminosis D 07/20/2011   Vitamin D was 17 in May 2012. Patient has not taken any Vitamin D supplement since that time.      Murmur, heart    Obesity    Vertigo    Vitamin B 12 deficiency 07/20/2011   Vitamin B12 was low at 169 in May 2012. No evidence of supplementation since that time.    Past Surgical History:  Procedure Laterality Date   ABDOMINAL HYSTERECTOMY     Family History   Problem Relation Age of Onset   Melanoma Mother    Breast cancer Maternal Aunt    Colon cancer Maternal Uncle    Prostate cancer Maternal Uncle        x 2   Breast cancer Niece    Esophageal cancer Neg Hx    Pancreatic cancer Neg Hx    Stomach cancer Neg Hx    Liver disease Neg Hx    Rectal cancer Neg Hx    Social History   Socioeconomic History   Marital status: Single    Spouse name: Not on file   Number of children: 1   Years of education: Not on file   Highest education level: Not on file  Occupational History   Occupation: Retired  Tobacco Use   Smoking status: Never    Passive exposure: Never   Smokeless tobacco: Never  Vaping Use   Vaping Use: Never used  Substance and Sexual Activity   Alcohol use: No   Drug use: No   Sexual activity: Not Currently    Birth control/protection: Surgical  Other Topics Concern   Not on file  Social History Narrative   Not on file   Social Determinants of Health   Financial Resource Strain: Low Risk  (02/22/2022)   Overall Financial Resource Strain (CARDIA)    Difficulty of Paying Living Expenses: Not hard at all  Food Insecurity: No Food Insecurity (02/22/2022)   Hunger Vital Sign    Worried About Running Out of Food in the Last Year: Never true    Ran Out of Food in the Last Year: Never true  Transportation Needs: No Transportation Needs (02/22/2022)   PRAPARE - Hydrologist (Medical): No    Lack of Transportation (Non-Medical): No  Physical Activity: Insufficiently Active (02/22/2022)   Exercise Vital Sign    Days of Exercise per Week: 5 days    Minutes of Exercise per Session: 10 min  Stress: No Stress Concern Present (02/22/2022)   [place]    Feeling of Stress : Only a little  Social Connections: Socially Isolated (02/22/2022)   Social Connection and Isolation Panel [NHANES]    Frequency of Communication with Friends and Family:  More than three times a week    Frequency of Social Gatherings with Friends and Family: Once a week    Attends Religious Services: Never    Marine scientist or Organizations: No    Attends Archivist Meetings: Never    Marital Status: Divorced    Tobacco  Counseling Counseling given: Not Answered   Clinical Intake:  Pre-visit preparation completed: No  Pain : No/denies pain Pain Score: 0-No pain     Nutritional Status: BMI 25 -29 Overweight Nutritional Risks: None Diabetes: No  How often do you need to have someone help you when you read instructions, pamphlets, or other written materials from your doctor or pharmacy?: 1 - Never  Diabetic?No  Interpreter Needed?: No  Information entered by :: Donnie Mesa, CMA   Activities of Daily Living    02/22/2022    1:47 PM  In your present state of health, do you have any difficulty performing the following activities:  Hearing? 0  Vision? 1  Difficulty concentrating or making decisions? 0  Walking or climbing stairs? 0  Dressing or bathing? 0  Doing errands, shopping? 0    Patient Care Team: Lowry Ram, MD as PCP - General (Family Medicine) Jon Billings, RN as [place] any recent [place] you may have received from other than [facility] providers in the past year (date may be approximate).     Assessment:   This is a routine wellness examination for [place].  Hearing/Vision screen Denies hearing issues. Denies any changes with her vision. Wear glasses. Annual Eye Exam.  Dietary issues and exercise activities discussed: Current Exercise Habits: Home exercise routine, Type of exercise: walking, Time (Minutes): 10, Frequency (Times/Week): 5, Weekly Exercise (Minutes/Week): 50, Intensity: Mild, Exercise limited by: None identified   Goals Addressed   None    Depression Screen    02/22/2022    1:46 PM 02/10/2022   11:20 AM 10/29/2021    3:08 PM  10/21/2021   10:58 AM 09/23/2021    8:33 AM 09/14/2021    8:31 AM 09/02/2021   10:58 AM  PHQ 2/9 Scores  PHQ - 2 Score 0 1 0 0 0 0 0  PHQ- 9 Score   0  0 0 2    Fall Risk    02/22/2022    1:47 PM 10/29/2021    3:07 PM 09/23/2021    8:33 AM 09/14/2021    8:31 AM 09/02/2021   10:58 AM  [place] in the past year? 0 0 0 0 0  Number falls in past yr: 0 0 0 0 0  Injury with Fall? 0 0 0 0 0  Risk for fall due to : No Fall Risks No Fall Risks     Follow up Falls evaluation completed        FALL RISK PREVENTION PERTAINING TO THE HOME:  Any stairs in or around the home? No  If so, are there any without handrails? No  Home free of loose throw rugs in walkways, pet beds, electrical cords, etc? Yes  Adequate lighting in your home to reduce risk of falls? Yes   ASSISTIVE DEVICES UTILIZED TO PREVENT FALLS:  Life alert? No  Use of a cane, walker or w/c? No  Grab bars in the bathroom? No  Shower chair or bench in shower? Yes  Elevated toilet seat or a handicapped toilet? No   TIMED UP AND GO:  Was the test performed?  unable to perform  .  Cognitive Function:        02/22/2022    1:48 PM  6CIT Screen  What Year? 0 points  What month? 0 points  What time? 0 points  Count back from 20 0 points  Months in reverse 0 points  Immunizations Immunization History  Administered Date(s) Administered   Fluad Quad(high Dose 65+) 11/26/2019, 03/20/2021, 10/29/2021   Influenza Split 12/20/2011   Influenza Whole 10/27/2009   Influenza,inj,Quad PF,6+ Mos 11/13/2015, 11/08/2016, 02/13/2018, 10/27/2018   PFIZER(Purple Top)SARS-COV-2 Vaccination 03/30/2019, 04/23/2019, 01/02/2020   Pneumococcal Conjugate-13 11/13/2015   Pneumococcal Polysaccharide-23 08/04/2017   Td 08/18/2001   Tdap 11/13/2015   Zoster Recombinat (Shingrix) 03/23/2021, 06/08/2021    TDAP status: Up to date  Flu Vaccine status: Up to date  Pneumococcal vaccine status: Up to date  Covid-19 vaccine status:  Information provided on how to obtain vaccines.   Qualifies for Shingles Vaccine? Yes   Zostavax completed Yes   Shingrix Completed?: Yes  Screening Tests Health Maintenance  Topic Date Due   COVID-19 Vaccine (4 - 2023-24 season) 09/18/2021   MAMMOGRAM  06/04/2022   Medicare Annual Wellness (AWV)  02/23/2023   DTaP/Tdap/Td (3 - Td or Tdap) 11/12/2025   COLONOSCOPY (Pts 45-53yr Insurance coverage will need to be confirmed)  12/22/2025   Pneumonia Vaccine 74 Years old  Completed   INFLUENZA VACCINE  Completed   DEXA SCAN  Completed   Hepatitis C Screening  Completed   Zoster Vaccines- Shingrix  Completed   HPV VACCINES  Aged Out    Health Maintenance  Health Maintenance Due  Topic Date Due   COVID-19 Vaccine (4 - 2023-24 season) 09/18/2021    Colorectal cancer screening: Type of screening: Colonoscopy. Completed 12/22/2020. Repeat every 5 years  Mammogram status: Completed 12/04/2021. Repeat every year  DEXA Scan: 10/22/2016  Lung Cancer Screening: (Low Dose CT Chest recommended if Age 74-80years, 30 pack-year currently smoking OR have quit w/in 15years.) does not qualify.   Lung Cancer Screening Referral: not applicable   Additional Screening:  Hepatitis C Screening: does qualify; Completed 10/06/2016  Vision Screening: Recommended annual ophthalmology exams for early detection of glaucoma and other disorders of the eye. Is the patient up to date with their annual eye exam?  Yes  Who is the provider or what is the name of the office in which the patient attends [facility] exams[facility] If pt is not established with a provider, would they like to be referred to a provider to establish care? No .   Dental Screening: Recommended annual dental exams for proper oral hygiene  Community Resource Referral / Chronic Care Management: CRR required this visit?  No   CCM required this visit?  No      Plan:     I have personally reviewed and noted the  following in the patient's chart:   Medical and social history Use of alcohol, tobacco or illicit drugs  Current medications and supplements including opioid prescriptions. Patient is not currently taking opioid prescriptions. Functional ability and status Nutritional status Physical activity Advanced directives List of other physicians Hospitalizations, surgeries, and ER visits in previous 12 months Vitals Screenings to include cognitive, depression, and falls Referrals and appointments  In addition, I have reviewed and discussed with patient certain preventive protocols, quality metrics, and best practice recommendations. A written personalized care plan for preventive services as well as general preventive health recommendations were provided to patient.    Ms. APrincipato, Thank you for taking time to come for your Medicare Wellness Visit. I appreciate your ongoing commitment to your health goals. Please review the following plan we discussed and let me know if I can assist you in the future.   These are the goals we discussed:  Goals      Maintain Health     Care Coordination Interventions: Patient interviewed about adult health maintenance status including  Blood Pressure          Not checking Blood pressure at this time.  Encouraged self care and physician follow up. AWV scheduled 02/22/22          This is a list of the screening recommended for you and due dates:  Health Maintenance  Topic Date Due   COVID-19 Vaccine (4 - 2023-24 season) 09/18/2021   Mammogram  06/04/2022   Medicare Annual Wellness Visit  02/23/2023   DTaP/Tdap/Td vaccine (3 - Td or Tdap) 11/12/2025   Colon Cancer Screening  12/22/2025   Pneumonia Vaccine  Completed   Flu Shot  Completed   DEXA scan (bone density measurement)  Completed   Hepatitis C Screening: USPSTF Recommendation to screen - Ages 62-79 yo.  Completed   Zoster (Shingles) Vaccine  Completed   HPV Vaccine  Aged [address] [place]   02/22/2022   Nurse Notes: Approximately 30 minute Non-Face -To-Face Medicare Wellness Visit

## 2022-02-22 NOTE — Patient Instructions (Signed)

## 2022-03-10 ENCOUNTER — Ambulatory Visit: Payer: Self-pay

## 2022-03-10 NOTE — Patient Outreach (Signed)
  Care Coordination   Follow Up Visit Note   03/10/2022 Name: Jasmine Gregory MRN: [id number] DOB: [birth year]  Jasmine Gregory is a 72 y.o. year old female who sees Lowry Ram, MD for primary care. I spoke with  Jasmine Gregory by phone today.  What matters to the patients health and wellness today?  Continued health maintenance    Goals Addressed             This Visit's Progress    Maintain Health       Care Coordination Interventions: Patient interviewed about adult health maintenance status including  Blood Pressure          Not checking Blood pressure at this time.  Encouraged self care and physician follow up. AWV completed 02/22/22.  No concerns  Interventions Today    Flowsheet Row Most Recent Value  Chronic Disease   Chronic disease during today's visit Hypertension (HTN)  General Interventions   General Interventions Discussed/Reviewed General Interventions Discussed  Education Interventions   Education Provided Provided Education  Provided Verbal Education On Other  [Blood pressure]             SDOH assessments and interventions completed:  Yes     Care Coordination Interventions:  Yes, provided   Follow up plan: Follow up call scheduled for April    Encounter Outcome:  Pt. Visit Completed   Jone Baseman, RN, MSN [place] [facility] Care Management Coordinator Direct Line [phone]

## 2022-03-10 NOTE — Patient Instructions (Signed)
Visit Information  Thank you for taking time to visit with me today. Please don't hesitate to contact me if I can be of assistance to you.   Following are the goals we discussed today:   Goals Addressed             This Visit's Progress    Maintain Health       Care Coordination Interventions: Patient interviewed about adult health maintenance status including  Blood Pressure          Not checking Blood pressure at this time.  Encouraged self care and physician follow up. AWV completed 02/22/22.  No concerns  Interventions Today    Flowsheet Row Most Recent Value  Chronic Disease   Chronic disease during today's visit Hypertension (HTN)  General Interventions   General Interventions Discussed/Reviewed General Interventions Discussed  Education Interventions   Education Provided Provided Education  Provided Verbal Education On Other  [Blood pressure]             Our next appointment is by telephone on 05/05/22 at 1030  Please call the care guide team at [phone] if you need to cancel or reschedule your appointment.   If you are experiencing a Mental Health or [place] or need someone to talk to, please call the Suicide and Crisis Lifeline: 988   Patient verbalizes understanding of instructions and care plan provided today and agrees to view in [place]. Active MyChart status and patient understanding of how to access instructions and care plan via MyChart confirmed with patient.     The patient has been provided with contact information for the care management team and has been advised to call with any health related questions or concerns.    Jone Baseman, RN, MSN [place] [facility] Care Management Coordinator Direct Line [phone]

## 2022-03-16 ENCOUNTER — Other Ambulatory Visit: Payer: Self-pay | Admitting: Family Medicine

## 2022-03-16 DIAGNOSIS — I10 Essential (primary) hypertension: Secondary | ICD-10-CM

## 2022-03-22 ENCOUNTER — Encounter: Payer: Self-pay | Admitting: Family Medicine

## 2022-03-22 ENCOUNTER — Ambulatory Visit (INDEPENDENT_AMBULATORY_CARE_PROVIDER_SITE_OTHER): Payer: Medicare Other | Admitting: Family Medicine

## 2022-03-22 VITALS — BP 163/65 | HR 80 | Ht 63.0 in | Wt 197.5 lb

## 2022-03-22 DIAGNOSIS — R5383 Other fatigue: Secondary | ICD-10-CM | POA: Diagnosis not present

## 2022-03-22 NOTE — Progress Notes (Signed)
    SUBJECTIVE:   CHIEF COMPLAINT / HPI:   Fatigue and cold  - Intermittent soreness in muscles (chronic) - Fatigue and feeling colder over the last 2 weeks - No recent sickness or unexpected weight gain - Does report hair loss/thinning that has been present for the last year - Is not taking the iron supplement prescribed, but does take B12  PERTINENT  PMH / PSH: Pernicious anemia, cutaneous lupus  OBJECTIVE:   BP (!) 165/75   Pulse 80   Ht '5\' 3"'$  (1.6 m)   Wt 197 lb 8 oz (89.6 kg)   SpO2 100%   BMI 34.99 kg/m   Gen: well-appearing, NAD CV: RRR, no m/r/g appreciated, no peripheral edema Pulm: CTAB, no wheezes/crackles GI: soft, non-tender, non-distended  ASSESSMENT/PLAN:   Fatigue and cold sensitivity Patient presenting for 2 weeks of significant fatigue and cold sensitivity.  Physical exam is overall unremarkable, but given history and patient's chronic conditions, will obtain labs to rule out thyroid and anemia contribution. - TSH with reflex to T4 if abnormal - CBC - Ferritin panel   Rise Patience, DO [place]

## 2022-03-22 NOTE — Patient Instructions (Signed)
We are going to get labs to check for any signs of anemia or thyroid disorder that could be causing your symptoms.  If our workup here in the office is negative then I would actually recommend following up with the physician who is managing your lupus to see if they may have other ideas of what could be causing the symptoms.

## 2022-03-23 ENCOUNTER — Encounter: Payer: Self-pay | Admitting: Family Medicine

## 2022-03-23 LAB — CBC WITH DIFFERENTIAL/PLATELET
Basophils Absolute: 0 10*3/uL (ref 0.0–0.2)
Basos: 0 %
EOS (ABSOLUTE): 0.5 10*3/uL — ABNORMAL HIGH (ref 0.0–0.4)
Eos: 9 %
Hematocrit: 32.7 % — ABNORMAL LOW (ref 34.0–46.6)
Hemoglobin: 10.2 g/dL — ABNORMAL LOW (ref 11.1–15.9)
Immature Grans (Abs): 0 10*3/uL (ref 0.0–0.1)
Immature Granulocytes: 0 %
Lymphocytes Absolute: 1.6 10*3/uL (ref 0.7–3.1)
Lymphs: 32 %
MCH: 27.2 pg (ref 26.6–33.0)
MCHC: 31.2 g/dL — ABNORMAL LOW (ref 31.5–35.7)
MCV: 87 fL (ref 79–97)
Monocytes Absolute: 0.6 10*3/uL (ref 0.1–0.9)
Monocytes: 11 %
Neutrophils Absolute: 2.4 10*3/uL (ref 1.4–7.0)
Neutrophils: 48 %
Platelets: 207 10*3/uL (ref 150–450)
RBC: 3.75 x10E6/uL — ABNORMAL LOW (ref 3.77–5.28)
RDW: 12.9 % (ref 11.7–15.4)
WBC: 5.1 10*3/uL (ref 3.4–10.8)

## 2022-03-23 LAB — IRON,TIBC AND FERRITIN PANEL
Ferritin: 96 ng/mL (ref 15–150)
Iron Saturation: 15 % (ref 15–55)
Iron: 32 ug/dL (ref 27–139)
Total Iron Binding Capacity: 219 ug/dL — ABNORMAL LOW (ref 250–450)
UIBC: 187 ug/dL (ref 118–369)

## 2022-03-23 LAB — TSH RFX ON ABNORMAL TO FREE T4: TSH: 1.13 u[IU]/mL (ref 0.450–4.500)

## 2022-04-14 ENCOUNTER — Other Ambulatory Visit: Payer: Self-pay | Admitting: Family Medicine

## 2022-04-14 DIAGNOSIS — I10 Essential (primary) hypertension: Secondary | ICD-10-CM

## 2022-04-14 NOTE — Telephone Encounter (Signed)
Called and lvm for patient to call back and schedule follow up appointment.   Thanks [employer]

## 2022-04-22 ENCOUNTER — Encounter: Payer: Self-pay | Admitting: Student

## 2022-04-22 ENCOUNTER — Ambulatory Visit (INDEPENDENT_AMBULATORY_CARE_PROVIDER_SITE_OTHER): Payer: Medicare Other | Admitting: Student

## 2022-04-22 VITALS — BP 142/72 | HR 76 | Ht 63.0 in | Wt 196.0 lb

## 2022-04-22 DIAGNOSIS — I10 Essential (primary) hypertension: Secondary | ICD-10-CM | POA: Diagnosis not present

## 2022-04-22 MED ORDER — AMLODIPINE BESYLATE-VALSARTAN 5-160 MG PO TABS: 1.0000 | ORAL_TABLET | Freq: Every day | ORAL | 0 refills | Status: DC

## 2022-04-22 NOTE — Assessment & Plan Note (Signed)
Patient very close to goal at 142/70.  Has been adherent to medications and asymptomatic. -Continue amlodipine-valsartan 5-160 mg nightly -Monitor at future visits

## 2022-04-22 NOTE — Patient Instructions (Signed)
It was great to see you! Thank you for allowing me to participate in your care!   Our plans for today:  -Your blood pressure looks to be around controlled today.  I want you to continue your amlodipine-valsartan 5-160 mg nightly.  Please contact us if you have any questions  Take care and seek immediate care sooner if you develop any concerns.  Gerrit Heck, MD

## 2022-04-22 NOTE — Progress Notes (Signed)
    SUBJECTIVE:   CHIEF COMPLAINT / HPI:   Hypertension: Patient is a 72 y.o. female who present today for follow up of hypertension.   Patient endorses no problems  Home medications include: Amlodipine-valsartan 5-160 mg every night Patient endorses taking these medications as prescribed. Denies any headache, vision changes, shortness of breath, lower extremity swelling or chest pain   Most recent creatinine trend:  Lab Results  Component Value Date   CREATININE 1.10 (H) 10/29/2021   CREATININE 0.97 09/23/2021   CREATININE 0.99 03/18/2021   Patient does not check blood pressure at home.  Patient has had a BMP in the past 1 year.  PERTINENT  PMH / PSH: HLD  OBJECTIVE:   BP (!) 142/72   Pulse 76   Ht 5\' 3"  (1.6 m)   Wt 196 lb (88.9 kg)   SpO2 98%   BMI 34.72 kg/m   General: Well appearing, NAD, awake, alert, responsive to questions Head: Normocephalic atraumatic CV: Regular rate and rhythm no murmurs rubs or gallops Respiratory: Clear to ausculation bilaterally, no wheezes rales or crackles, chest rises symmetrically,  no increased work of breathing  ASSESSMENT/PLAN:   HYPERTENSION, BENIGN ESSENTIAL Patient very close to goal at 142/70.  Has been adherent to medications and asymptomatic. -Continue amlodipine-valsartan 5-160 mg nightly -Monitor at future visits   Gerrit Heck, MD [place]

## 2022-04-22 NOTE — Addendum Note (Signed)
Addended by: Gerrit Heck on: 04/22/2022 10:16 AM   Modules accepted: Orders

## 2022-04-23 ENCOUNTER — Telehealth: Payer: Self-pay

## 2022-04-23 NOTE — Patient Instructions (Signed)
Visit Information  Thank you for taking time to visit with me today. Please don't hesitate to contact me if I can be of assistance to you.   Following are the goals we discussed today:   Goals Addressed             This Visit's Progress    Maintain Health       Care Coordination Interventions: Patient interviewed about adult health maintenance status including  Blood Pressure          Not checking Blood pressure at this time.  Encouraged self care and physician follow up. Patient saw PCP on yesterday.  No concerns presently. Last blood pressure 140/70. Interventions Today    Flowsheet Row Most Recent Value  Chronic Disease   Chronic disease during today's visit Hypertension (HTN)  General Interventions   General Interventions Discussed/Reviewed General Interventions Discussed, Health Screening  Health Screening Colonoscopy, Mammogram  Education Interventions   Education Provided Provided Education  Provided Verbal Education On Nutrition, Other  [Blood pressure monitoring and control]  Nutrition Interventions   Nutrition Discussed/Reviewed Nutrition Discussed, Decreasing salt  Pharmacy Interventions   Pharmacy Dicussed/Reviewed Pharmacy Topics Discussed             Our next appointment is by telephone on 06/30/22 at 1030  Please call the care guide team at [phone] if you need to cancel or reschedule your appointment.   If you are experiencing a Mental Health or Behavioral Health Crisis or need someone to talk to, please call the Suicide and Crisis Lifeline: 988   Patient verbalizes understanding of instructions and care plan provided today and agrees to view in MyChart. Active MyChart status and patient understanding of how to access instructions and care plan via MyChart confirmed with patient.     The patient has been provided with contact information for the care management team and has been advised to call with any health related questions or concerns.    Bary Leriche, RN, MSN [facility] Care Management Care Management Coordinator Direct Line [phone]

## 2022-04-23 NOTE — Patient Outreach (Signed)
  Care Coordination   Follow Up Visit Note   04/23/2022 Name: Jasmine Gregory MRN: [id number] DOB: December 12, 1950  Jasmine Gregory is a 72 y.o. year old female who sees Jasmine Mola, MD for primary care. I spoke with  Jasmine Gregory by phone today.  What matters to the patients health and wellness today?  Maintain health    Goals Addressed             This Visit's Progress    Maintain Health       Care Coordination Interventions: Patient interviewed about adult health maintenance status including  Blood Pressure          Not checking Blood pressure at this time.  Encouraged self care and physician follow up. Patient saw PCP on yesterday.  No concerns presently. Last blood pressure 140/70. Interventions Today    Flowsheet Row Most Recent Value  Chronic Disease   Chronic disease during today's visit Hypertension (HTN)  General Interventions   General Interventions Discussed/Reviewed General Interventions Discussed, Health Screening  Health Screening Colonoscopy, Mammogram  Education Interventions   Education Provided Provided Education  Provided Verbal Education On Nutrition, Other  [Blood pressure monitoring and control]  Nutrition Interventions   Nutrition Discussed/Reviewed Nutrition Discussed, Decreasing salt  Pharmacy Interventions   Pharmacy Dicussed/Reviewed Pharmacy Topics Discussed             SDOH assessments and interventions completed:  Yes     Care Coordination Interventions:  Yes, provided   Follow up plan: Follow up call scheduled for June    Encounter Outcome:  Pt. Visit Completed   Jasmine Leriche, RN, MSN [facility] Care Management Care Management Coordinator Direct Line [phone]

## 2022-06-16 ENCOUNTER — Ambulatory Visit
Admission: RE | Admit: 2022-06-16 | Discharge: 2022-06-16 | Disposition: A | Discharge status: Home / Self Care | Payer: Medicare Other | Source: Ambulatory Visit | Attending: Family Medicine | Admitting: Family Medicine

## 2022-06-16 ENCOUNTER — Other Ambulatory Visit: Payer: Self-pay | Admitting: Family Medicine

## 2022-06-16 DIAGNOSIS — R921 Mammographic calcification found on diagnostic imaging of breast: Secondary | ICD-10-CM | POA: Diagnosis not present

## 2022-06-16 DIAGNOSIS — R928 Other abnormal and inconclusive findings on diagnostic imaging of breast: Secondary | ICD-10-CM

## 2022-06-16 DIAGNOSIS — N6320 Unspecified lump in the left breast, unspecified quadrant: Secondary | ICD-10-CM

## 2022-06-16 DIAGNOSIS — N63 Unspecified lump in unspecified breast: Secondary | ICD-10-CM | POA: Diagnosis not present

## 2022-06-18 NOTE — Progress Notes (Signed)
Recommend bilateral diagnostic mammogram and left breast ultrasound in 6 months.  They saw calcifications that are stable, and probably benign but want to follow it up.

## 2022-06-30 ENCOUNTER — Ambulatory Visit: Payer: Self-pay

## 2022-06-30 NOTE — Patient Instructions (Signed)
Visit Information  Thank you for taking time to visit with me today. Please don't hesitate to contact me if I can be of assistance to you.   Following are the goals we discussed today:   Goals Addressed             This Visit's Progress    Maintain Health       Care Coordination Interventions: Evaluation of current treatment plan related to hypertension self management and patient's adherence to plan as established by provider Reviewed medications with patient and discussed importance of compliance Counseled on the importance of exercise goals with target of 150 minutes per week Advised patient, providing education and rationale, to monitor blood pressure daily and record, calling PCP for findings outside established parameters        Patient reports doing well.  She is checking pressure at home but does not have numbers.  Discussed importance of HTN management and goal of blood pressure less than 140/80.  No concerns.            Our next appointment is by telephone on 09/22/22 at 1030 am  Please call the care guide team at [phone] if you need to cancel or reschedule your appointment.   If you are experiencing a Mental Health or Behavioral Health Crisis or need someone to talk to, please call the Suicide and Crisis Lifeline: 988   Patient verbalizes understanding of instructions and care plan provided today and agrees to view in MyChart. Active MyChart status and patient understanding of how to access instructions and care plan via MyChart confirmed with patient.     The patient has been provided with contact information for the care management team and has been advised to call with any health related questions or concerns.   Bary Leriche, RN, MSN [facility] Care Management Care Management Coordinator Direct Line [phone]

## 2022-06-30 NOTE — Patient Outreach (Signed)
  Care Coordination   Follow Up Visit Note   06/30/2022 Name: [name] MRN: [id number] DOB: 03/08/1950  Jasmine Gregory is a 72 y.o. year old female who sees Lockie Mola, MD for primary care. I spoke with  Jasmine Gregory by phone today.  What matters to the patients health and wellness today?  Maintain health    Goals Addressed             This Visit's Progress    Maintain Health       Care Coordination Interventions: Evaluation of current treatment plan related to hypertension self management and patient's adherence to plan as established by provider Reviewed medications with patient and discussed importance of compliance Counseled on the importance of exercise goals with target of 150 minutes per week Advised patient, providing education and rationale, to monitor blood pressure daily and record, calling PCP for findings outside established parameters        Patient reports doing well.  She is checking pressure at home but does not have numbers.  Discussed importance of HTN management and goal of blood pressure less than 140/80.  No concerns.            SDOH assessments and interventions completed:  Yes  SDOH Interventions Today    Flowsheet Row Most Recent Value  SDOH Interventions   Food Insecurity Interventions Intervention Not Indicated  Housing Interventions Intervention Not Indicated  Transportation Interventions Intervention Not Indicated        Care Coordination Interventions:  Yes, provided   Follow up plan: Follow up call scheduled for September    Encounter Outcome:  Pt. Visit Completed   Bary Leriche, RN, MSN [facility] Care Management Care Management Coordinator Direct Line [phone]

## 2022-08-11 ENCOUNTER — Other Ambulatory Visit: Payer: Self-pay | Admitting: Family Medicine

## 2022-08-11 DIAGNOSIS — I10 Essential (primary) hypertension: Secondary | ICD-10-CM

## 2022-08-17 DIAGNOSIS — M1990 Unspecified osteoarthritis, unspecified site: Secondary | ICD-10-CM | POA: Diagnosis not present

## 2022-08-17 DIAGNOSIS — L932 Other local lupus erythematosus: Secondary | ICD-10-CM | POA: Diagnosis not present

## 2022-08-17 DIAGNOSIS — M109 Gout, unspecified: Secondary | ICD-10-CM | POA: Diagnosis not present

## 2022-08-17 DIAGNOSIS — E669 Obesity, unspecified: Secondary | ICD-10-CM | POA: Diagnosis not present

## 2022-08-17 DIAGNOSIS — M3500 Sicca syndrome, unspecified: Secondary | ICD-10-CM | POA: Diagnosis not present

## 2022-08-17 DIAGNOSIS — D649 Anemia, unspecified: Secondary | ICD-10-CM | POA: Diagnosis not present

## 2022-08-17 DIAGNOSIS — M25562 Pain in left knee: Secondary | ICD-10-CM | POA: Diagnosis not present

## 2022-09-01 ENCOUNTER — Ambulatory Visit (INDEPENDENT_AMBULATORY_CARE_PROVIDER_SITE_OTHER): Payer: Medicare Other | Admitting: Family Medicine

## 2022-09-01 ENCOUNTER — Encounter: Payer: Self-pay | Admitting: Family Medicine

## 2022-09-01 VITALS — BP 142/74 | HR 72 | Wt 195.0 lb

## 2022-09-01 DIAGNOSIS — R928 Other abnormal and inconclusive findings on diagnostic imaging of breast: Secondary | ICD-10-CM | POA: Diagnosis not present

## 2022-09-01 DIAGNOSIS — I10 Essential (primary) hypertension: Secondary | ICD-10-CM | POA: Diagnosis not present

## 2022-09-01 DIAGNOSIS — K219 Gastro-esophageal reflux disease without esophagitis: Secondary | ICD-10-CM | POA: Diagnosis not present

## 2022-09-01 MED ORDER — FAMOTIDINE 20 MG PO TABS
20.0000 mg | ORAL_TABLET | Freq: Two times a day (BID) | ORAL | 1 refills | Status: DC
Start: 2022-09-01 — End: 2022-10-28

## 2022-09-01 NOTE — Assessment & Plan Note (Signed)
Had calcification of left breast shown on 11/23 mammogram.  Has follow-up diagnostic mammogram and ultrasound scheduled for December.

## 2022-09-01 NOTE — Assessment & Plan Note (Signed)
Had borderline elevated calcium at endocrinology office (10.4, with cutoff of 10.3).  Most likely due to taking a lot of Rolaid for GERD; has stopped taking for past 1.5wks. Will repeat BMP to eval Ca today. If elevated, then plan to check PTH to eval primary hyperPTH vs maligancy.

## 2022-09-01 NOTE — Patient Instructions (Signed)
Good to see you today - Thank you for coming in  Things we discussed today:  1) For your acid reflux and indigestion sensation - Start taking Famotidine 20mg  twice a day - You can still use Rolaids as needed  2) For your calcium - Let's check it today - If it is still high, then I will call you and we will check additional labs  3) Your blood pressure is just tiny bit elevated and it could be due to nerves around being in the doctors office. - Get a home blood pressure cuff and check it daily.  - The goal blood pressure for you is less than 140/90  Please always bring your medication bottles  Come back to see me in 1 month to follow-up on your blood pressure and acid reflux.

## 2022-09-01 NOTE — Progress Notes (Signed)
    SUBJECTIVE:   CHIEF COMPLAINT / HPI:   Jasmine Gregory is a 72yo F w/ hx of GERD, lupus, Sjogren, OA, gout, HTN that p/w indigestion.  Indigestion  HyperCalcemia - Having discomfort in epigastric area for past 2 wks. When she presses on it, she belches.  - Denies dysphagia or feeling food is getting stuck in throat. - Taking a lot of Rolaids for indigestion.  - Was noted at her endocrinologist to have elevated calcium and was recommended to follow-up with PCP - Takes VitD OTC supplement.  - Stopped Rolaids for 1.5wks now because she thought it may have caused high calcium.   OBJECTIVE:   BP (!) 142/74   Pulse 72   Wt 195 lb (88.5 kg)   SpO2 98%   BMI 34.54 kg/m   General: Alert, pleasant woman. NAD. HEENT: NCAT. MMM. CV: RRR, no murmurs. Cap refill <2. Resp: CTAB, no wheezing or crackles. Normal WOB on RA.  Abm: Soft, nontender, nondistended. BS present. Ext: Moves all ext spontaneously Skin: Warm, well perfused   ASSESSMENT/PLAN:   HYPERTENSION, BENIGN ESSENTIAL BP very close to goal at 142/74, similar to prior visit.  Been adherent with medication.  Reports that she gets very nervous at doctor's office. -Continue amlodipine valsartan 5-160 mg daily -Recommend checking home blood pressure.  Goal is less than 140/90.  Follow-up in 1 month  GASTROESOPHAGEAL REFLUX, NO ESOPHAGITIS 2-week history of epigastric discomfort and belching.  Has been taking Rolaids, but minimally helpful.  Abdominal exam benign.  Weight stable. -Start famotidine 20 mg twice daily  Hypercalcemia Had borderline elevated calcium at endocrinology office (10.4, with cutoff of 10.3).  Most likely due to taking a lot of Rolaid for GERD; has stopped taking for past 1.5wks. Will repeat BMP to eval Jasmine Gregory today. If elevated, then plan to check PTH to eval primary hyperPTH vs maligancy.  Abnormal mammogram of left breast Had calcification of left breast shown on 11/23 mammogram.  Has follow-up diagnostic mammogram and  ultrasound scheduled for December.   [name], MD [facility]

## 2022-09-01 NOTE — Assessment & Plan Note (Signed)
2-week history of epigastric discomfort and belching.  Has been taking Rolaids, but minimally helpful.  Abdominal exam benign.  Weight stable. -Start famotidine 20 mg twice daily

## 2022-09-01 NOTE — Assessment & Plan Note (Signed)
BP very close to goal at 142/74, similar to prior visit.  Been adherent with medication.  Reports that she gets very nervous at doctor's office. -Continue amlodipine valsartan 5-160 mg daily -Recommend checking home blood pressure.  Goal is less than 140/90.  Follow-up in 1 month

## 2022-09-01 NOTE — Progress Notes (Deleted)
    SUBJECTIVE:   CHIEF COMPLAINT / HPI:   ***  PERTINENT  PMH / PSH: ***  OBJECTIVE:   BP (!) 156/74   Pulse 72   Wt 195 lb (88.5 kg)   SpO2 98%   BMI 34.54 kg/m   ***  ASSESSMENT/PLAN:   No problem-specific Assessment & Plan notes found for this encounter.     [name], MD [facility]

## 2022-09-02 LAB — BASIC METABOLIC PANEL
BUN/Creatinine Ratio: 18 (ref 12–28)
BUN: 20 mg/dL (ref 8–27)
CO2: 26 mmol/L (ref 20–29)
Calcium: 10.2 mg/dL (ref 8.7–10.3)
Chloride: 99 mmol/L (ref 96–106)
Creatinine, Ser: 1.09 mg/dL — ABNORMAL HIGH (ref 0.57–1.00)
Glucose: 184 mg/dL — ABNORMAL HIGH (ref 70–99)
Potassium: 4.2 mmol/L (ref 3.5–5.2)
Sodium: 139 mmol/L (ref 134–144)
eGFR: 54 mL/min/{1.73_m2} — ABNORMAL LOW (ref >59)

## 2022-09-08 ENCOUNTER — Other Ambulatory Visit: Payer: Self-pay | Admitting: Family Medicine

## 2022-09-08 DIAGNOSIS — I10 Essential (primary) hypertension: Secondary | ICD-10-CM

## 2022-09-21 ENCOUNTER — Ambulatory Visit: Payer: Self-pay

## 2022-09-21 NOTE — Patient Instructions (Signed)
Visit Information  Thank you for taking time to visit with me today. Please don't hesitate to contact me if I can be of assistance to you.   Following are the goals we discussed today:   Goals Addressed             This Visit's Progress    Maintain Health       Care Coordination Interventions: Evaluation of current treatment plan related to hypertension self management and patient's adherence to plan as established by provider Reviewed medications with patient and discussed importance of compliance Counseled on the importance of exercise goals with target of 150 minutes per week Advised patient, providing education and rationale, to monitor blood pressure daily and record, calling PCP for findings outside established parameters        Patient reports doing well.  She is  not checking pressure.  Discussed importance of blood pressure monitoring.  She verbalized understanding.   No concerns.            Our next appointment is by telephone on 12/20/22 at 1130 am  Please call the care guide team at [phone] if you need to cancel or reschedule your appointment.   If you are experiencing a Mental Health or Behavioral Health Crisis or need someone to talk to, please call the Suicide and Crisis Lifeline: 988   Patient verbalizes understanding of instructions and care plan provided today and agrees to view in MyChart. Active MyChart status and patient understanding of how to access instructions and care plan via MyChart confirmed with patient.     The patient has been provided with contact information for the care management team and has been advised to call with any health related questions or concerns.    Bary Leriche, RN, MSN [facility], [facility] Management Community Coordinator Direct Dial: [phone]  Fax: [phone] Website: [email or web address]

## 2022-09-21 NOTE — Patient Outreach (Signed)
  Care Coordination   Follow Up Visit Note   09/21/2022 Name: Jasmine Gregory MRN: [id number] DOB: 1951-01-07  Jasmine Gregory is a 72 y.o. year old female who sees Lockie Mola, MD for primary care. I spoke with  Jasmine Gregory by phone today.  What matters to the patients health and wellness today?  Maintain health    Goals Addressed             This Visit's Progress    Maintain Health       Care Coordination Interventions: Evaluation of current treatment plan related to hypertension self management and patient's adherence to plan as established by provider Reviewed medications with patient and discussed importance of compliance Counseled on the importance of exercise goals with target of 150 minutes per week Advised patient, providing education and rationale, to monitor blood pressure daily and record, calling PCP for findings outside established parameters        Patient reports doing well.  She is  not checking pressure.  Discussed importance of blood pressure monitoring.  She verbalized understanding.   No concerns.            SDOH assessments and interventions completed:  Yes  SDOH Interventions Today    Flowsheet Row Most Recent Value  SDOH Interventions   Food Insecurity Interventions Intervention Not Indicated  Housing Interventions Intervention Not Indicated  Transportation Interventions Intervention Not Indicated        Care Coordination Interventions:  Yes, provided   Follow up plan: Follow up call scheduled for December    Encounter Outcome:  Pt. Visit Completed   Bary Leriche, RN, MSN [facility]  [facility], [facility] Management Community Coordinator Direct Dial: [phone]  Fax: [phone] Website: [email or web address]

## 2022-10-05 ENCOUNTER — Ambulatory Visit (INDEPENDENT_AMBULATORY_CARE_PROVIDER_SITE_OTHER): Payer: Medicare Other | Admitting: Family Medicine

## 2022-10-05 ENCOUNTER — Encounter: Payer: Self-pay | Admitting: Family Medicine

## 2022-10-05 VITALS — BP 150/72 | HR 86 | Ht 63.0 in | Wt 203.2 lb

## 2022-10-05 DIAGNOSIS — K219 Gastro-esophageal reflux disease without esophagitis: Secondary | ICD-10-CM | POA: Diagnosis not present

## 2022-10-05 DIAGNOSIS — I10 Essential (primary) hypertension: Secondary | ICD-10-CM | POA: Diagnosis not present

## 2022-10-05 MED ORDER — AMLODIPINE BESYLATE-VALSARTAN 10-160 MG PO TABS
1.0000 | ORAL_TABLET | Freq: Every day | ORAL | 1 refills | Status: DC
Start: 2022-10-05 — End: 2022-11-29

## 2022-10-05 NOTE — Patient Instructions (Signed)
Good to see you today - Thank you for coming in  Things we discussed today:  1) Your blood pressure was still high today, so I will increase your medication dose. - Take amlodipine-valsartan 10-160mg  once a day - Check your blood pressure once a day for at least 5 days, write down those readings, and bring them into your next visit  - If you start having dizziness, lightheadedness when standing up, loss of conciousness, or leg swelling, stop taking the medication and reach out to our clinic - If you have blood pressure readings less than 100/60, then stop taking the medication and reach out to our clinic  Please always bring your medication bottles  Come back to see me in 1 month

## 2022-10-05 NOTE — Progress Notes (Signed)
    SUBJECTIVE:   CHIEF COMPLAINT / HPI:   CA is a 72 yo F w/ hx of HTYN, GERD that p/f BP f/u  HTN - Last took meds last night - Advised to take home BP reading at last visit. Brought home BP readings.  - SBP ranging 150-180 - DBP ranging 80-90  GERD - Started on famotidine at last visit - Feels like it is helpful for her GERD. Tried to stop, but symptoms came back. Restarted it. - Patient asks if this will be a chronic medication.  Recommended that patient can try to wean if she does not feel like she needs it, but should continue to take it if her GERD symptoms are bothering her.   OBJECTIVE:   BP (!) 150/72   Pulse 86   Ht 5\' 3"  (1.6 m)   Wt 203 lb 3.2 oz (92.2 kg)   BMI 36.00 kg/m   General: Alert, pleasant woman. NAD. HEENT: NCAT. MMM. CV: RRR, no murmurs.  Resp: CTAB, no wheezing or crackles. Normal WOB on RA.  Abm: Soft, nontender, nondistended. BS present. Ext: Moves all ext spontaneously. Trace edema in BL LE Skin: Warm, well perfused   ASSESSMENT/PLAN:   GASTROESOPHAGEAL REFLUX, NO ESOPHAGITIS Now controlled after starting famotidine. - Cont Famotidine 20 BID. Can attempt to wean if pt feels like GERD is improving.  HYPERTENSION, BENIGN ESSENTIAL BP remains above goal. Home BP readings similar to office readings. Plan to increase meds. - Increase amlodipine-Valsartan 5-160 to 10-160mg  daily.  - F/u in 1 month. If still above goal, increase to 10-320mg     [name], MD [facility]

## 2022-10-06 ENCOUNTER — Other Ambulatory Visit: Payer: Self-pay | Admitting: Family Medicine

## 2022-10-06 DIAGNOSIS — I10 Essential (primary) hypertension: Secondary | ICD-10-CM

## 2022-10-07 NOTE — Assessment & Plan Note (Signed)
BP remains above goal. Home BP readings similar to office readings. Plan to increase meds. - Increase amlodipine-Valsartan 5-160 to 10-160mg  daily.  - F/u in 1 month. If still above goal, increase to 10-320mg 

## 2022-10-07 NOTE — Assessment & Plan Note (Signed)
Now controlled after starting famotidine. - Cont Famotidine 20 BID. Can attempt to wean if pt feels like GERD is improving.

## 2022-10-27 ENCOUNTER — Other Ambulatory Visit: Payer: Self-pay | Admitting: Family Medicine

## 2022-10-27 DIAGNOSIS — K219 Gastro-esophageal reflux disease without esophagitis: Secondary | ICD-10-CM

## 2022-11-11 ENCOUNTER — Other Ambulatory Visit: Payer: Self-pay

## 2022-11-11 ENCOUNTER — Ambulatory Visit: Payer: Medicare Other | Admitting: Family Medicine

## 2022-11-11 ENCOUNTER — Encounter: Payer: Self-pay | Admitting: Family Medicine

## 2022-11-11 VITALS — BP 136/62 | HR 74 | Ht 63.0 in | Wt 207.4 lb

## 2022-11-11 DIAGNOSIS — R42 Dizziness and giddiness: Secondary | ICD-10-CM | POA: Diagnosis not present

## 2022-11-11 DIAGNOSIS — K219 Gastro-esophageal reflux disease without esophagitis: Secondary | ICD-10-CM

## 2022-11-11 DIAGNOSIS — I10 Essential (primary) hypertension: Secondary | ICD-10-CM

## 2022-11-11 NOTE — Progress Notes (Signed)
    SUBJECTIVE:   CHIEF COMPLAINT / HPI:   CA is a 72yo F w. Hx of HTN, GERD, vertigo that p/f BP f/u.   HTN - Feels better since starting higher dose of amlo-valsartan - Reports taking meds consistently  Jasmine Gregory - has been ongoing for 2 year - Has episodes where it comes and goes.  - Has seen vestibular rehab, not sure if it was helpful - Still doing their exercises, except not the epley because it makes her head more dizzy.   GERD - has been taking famotidine and symptoms are improving.    OBJECTIVE:   BP 136/62   Pulse 74   Ht 5\' 3"  (1.6 m)   Wt 207 lb 6.4 oz (94.1 kg)   SpO2 96%   BMI 36.74 kg/m   General: Alert, pleasant woman. NAD. HEENT: NCAT. MMM. CV: RRR, no murmurs. Resp: CTAB, no wheezing or crackles. Normal WOB on RA.  Abm: Soft, nontender, nondistended. BS present. Ext: Moves all ext spontaneously Skin: Warm, well perfused   ASSESSMENT/PLAN:   HYPERTENSION, BENIGN ESSENTIAL BP at goal today w/ increased amlodipine. - Continue daily amlo-valsartan 10-160  GASTROESOPHAGEAL REFLUX, NO ESOPHAGITIS Has been taking famotidine, symptoms are well controlled.  - Cont famotidine 20 BID  Vertigo Chronic hx of vertigo for past 2 years. Has gone to vestibular rehab. Reports recent exacerbation and more frequent episodes of vertigo. Pt reports sometimes doing vestibular rehab exercises, but avoid doing epley as it makes her more nauseous initially. Advised to continue daily epley maneuver for next week, and reassess if it helps with current acute exacerbation of vertigo.     Jasmine Brigham, MD [facility]

## 2022-11-11 NOTE — Patient Instructions (Addendum)
Good to see you today - Thank you for coming in  Things we discussed today:  1) Your blood pressure looks good today. We will check your labs today.  2) For your vertigo, make sure you do all the vestibular rehab exercises consistently. - Make sure to do the epley maneuveur (the exercise where you tilt your head over the edge of the bed) consistently. Do it daily for the next week and see if it helps.  Please always bring your medication bottles  Come back to see your primary care doctor in 6 months.

## 2022-11-12 DIAGNOSIS — R42 Dizziness and giddiness: Secondary | ICD-10-CM | POA: Insufficient documentation

## 2022-11-12 NOTE — Assessment & Plan Note (Signed)
Has been taking famotidine, symptoms are well controlled.  - Cont famotidine 20 BID

## 2022-11-12 NOTE — Assessment & Plan Note (Signed)
BP at goal today w/ increased amlodipine. - Continue daily amlo-valsartan 10-160

## 2022-11-12 NOTE — Assessment & Plan Note (Signed)
Chronic hx of vertigo for past 2 years. Has gone to vestibular rehab. Reports recent exacerbation and more frequent episodes of vertigo. Pt reports sometimes doing vestibular rehab exercises, but avoid doing epley as it makes her more nauseous initially. Advised to continue daily epley maneuver for next week, and reassess if it helps with current acute exacerbation of vertigo.

## 2022-11-27 ENCOUNTER — Other Ambulatory Visit: Payer: Self-pay | Admitting: Family Medicine

## 2022-11-27 DIAGNOSIS — K219 Gastro-esophageal reflux disease without esophagitis: Secondary | ICD-10-CM

## 2022-11-28 ENCOUNTER — Other Ambulatory Visit: Payer: Self-pay | Admitting: Family Medicine

## 2022-11-28 DIAGNOSIS — I10 Essential (primary) hypertension: Secondary | ICD-10-CM

## 2022-12-06 ENCOUNTER — Other Ambulatory Visit: Payer: Self-pay | Admitting: Family Medicine

## 2022-12-06 ENCOUNTER — Other Ambulatory Visit: Payer: Medicare Other

## 2022-12-06 DIAGNOSIS — E785 Hyperlipidemia, unspecified: Secondary | ICD-10-CM

## 2022-12-20 ENCOUNTER — Ambulatory Visit: Payer: Self-pay

## 2022-12-20 NOTE — Patient Instructions (Signed)
Visit Information  Thank you for taking time to visit with me today. Please don't hesitate to contact me if I can be of assistance to you.   Following are the goals we discussed today:   Goals Addressed             This Visit's Progress    Maintain Health       Care Coordination Interventions: Evaluation of current treatment plan related to hypertension self management and patient's adherence to plan as established by provider Reviewed medications with patient and discussed importance of compliance Counseled on the importance of exercise goals with target of 150 minutes per week Advised patient, providing education and rationale, to monitor blood pressure daily and record, calling PCP for findings outside established parameters        Patient reports doing okay, dealing with sadness of lost loved ones during the holiday.  However, she reports she is making it through.  She is  not checking pressure regularly.  Reiterared importance of blood pressure monitoring.  She verbalized understanding.   No concerns.            Our next appointment is by telephone on 03/21/23 at 1100 am  Please call the care guide team at [phone] if you need to cancel or reschedule your appointment.   If you are experiencing a Mental Health or Behavioral Health Crisis or need someone to talk to, please call the Suicide and Crisis Lifeline: 988   Patient verbalizes understanding of instructions and care plan provided today and agrees to view in MyChart. Active MyChart status and patient understanding of how to access instructions and care plan via MyChart confirmed with patient.     The patient has been provided with contact information for the care management team and has been advised to call with any health related questions or concerns.   Bary Leriche, RN, MSN RN Care Manager [facility], Population Health Direct Dial: [phone]  Fax: [phone] Website:  [email or web address]

## 2022-12-20 NOTE — Patient Outreach (Signed)
  Care Coordination   Follow Up Visit Note   12/20/2022 Name: Jasmine Gregory MRN: [id number] DOB: [birth year]  Jasmine Gregory is a 72 y.o. year old female who sees Lockie Mola, MD for primary care. I spoke with  Jasmine Gregory by phone today.  What matters to the patients health and wellness today?  Getting through the holidays    Goals Addressed             This Visit's Progress    Maintain Health       Care Coordination Interventions: Evaluation of current treatment plan related to hypertension self management and patient's adherence to plan as established by provider Reviewed medications with patient and discussed importance of compliance Counseled on the importance of exercise goals with target of 150 minutes per week Advised patient, providing education and rationale, to monitor blood pressure daily and record, calling PCP for findings outside established parameters        Patient reports doing okay, dealing with sadness of lost loved ones during the holiday.  However, she reports she is making it through.  She is  not checking pressure regularly.  Reiterared importance of blood pressure monitoring.  She verbalized understanding.   No concerns.            SDOH assessments and interventions completed:  Yes  SDOH Interventions Today    Flowsheet Row Most Recent Value  SDOH Interventions   Food Insecurity Interventions Intervention Not Indicated  Housing Interventions Intervention Not Indicated  Transportation Interventions Intervention Not Indicated  Utilities Interventions Intervention Not Indicated  Health Literacy Interventions Intervention Not Indicated        Care Coordination Interventions:  Yes, provided   Follow up plan: Follow up call scheduled for March    Encounter Outcome:  Patient Visit Completed

## 2022-12-21 ENCOUNTER — Ambulatory Visit
Admission: RE | Admit: 2022-12-21 | Discharge: 2022-12-21 | Disposition: A | Discharge status: Home / Self Care | Payer: Medicare Other | Source: Ambulatory Visit | Attending: Family Medicine | Admitting: Family Medicine

## 2022-12-21 DIAGNOSIS — R921 Mammographic calcification found on diagnostic imaging of breast: Secondary | ICD-10-CM | POA: Diagnosis not present

## 2022-12-21 DIAGNOSIS — N6324 Unspecified lump in the left breast, lower inner quadrant: Secondary | ICD-10-CM | POA: Diagnosis not present

## 2022-12-21 DIAGNOSIS — N6320 Unspecified lump in the left breast, unspecified quadrant: Secondary | ICD-10-CM

## 2022-12-25 ENCOUNTER — Other Ambulatory Visit: Payer: Self-pay | Admitting: Family Medicine

## 2022-12-25 DIAGNOSIS — K219 Gastro-esophageal reflux disease without esophagitis: Secondary | ICD-10-CM

## 2022-12-26 ENCOUNTER — Other Ambulatory Visit: Payer: Self-pay | Admitting: Family Medicine

## 2022-12-26 DIAGNOSIS — I10 Essential (primary) hypertension: Secondary | ICD-10-CM

## 2023-01-24 ENCOUNTER — Other Ambulatory Visit: Payer: Self-pay | Admitting: Family Medicine

## 2023-01-24 DIAGNOSIS — K219 Gastro-esophageal reflux disease without esophagitis: Secondary | ICD-10-CM

## 2023-01-25 ENCOUNTER — Other Ambulatory Visit: Payer: Self-pay | Admitting: Family Medicine

## 2023-01-25 DIAGNOSIS — I10 Essential (primary) hypertension: Secondary | ICD-10-CM

## 2023-02-17 DIAGNOSIS — M1990 Unspecified osteoarthritis, unspecified site: Secondary | ICD-10-CM | POA: Diagnosis not present

## 2023-02-17 DIAGNOSIS — E669 Obesity, unspecified: Secondary | ICD-10-CM | POA: Diagnosis not present

## 2023-02-17 DIAGNOSIS — D649 Anemia, unspecified: Secondary | ICD-10-CM | POA: Diagnosis not present

## 2023-02-17 DIAGNOSIS — M25562 Pain in left knee: Secondary | ICD-10-CM | POA: Diagnosis not present

## 2023-02-17 DIAGNOSIS — L932 Other local lupus erythematosus: Secondary | ICD-10-CM | POA: Diagnosis not present

## 2023-02-17 DIAGNOSIS — M109 Gout, unspecified: Secondary | ICD-10-CM | POA: Diagnosis not present

## 2023-02-17 DIAGNOSIS — R700 Elevated erythrocyte sedimentation rate: Secondary | ICD-10-CM | POA: Diagnosis not present

## 2023-02-17 DIAGNOSIS — M3500 Sicca syndrome, unspecified: Secondary | ICD-10-CM | POA: Diagnosis not present

## 2023-02-23 ENCOUNTER — Other Ambulatory Visit: Payer: Self-pay | Admitting: Family Medicine

## 2023-02-23 DIAGNOSIS — K219 Gastro-esophageal reflux disease without esophagitis: Secondary | ICD-10-CM

## 2023-02-24 ENCOUNTER — Other Ambulatory Visit: Payer: Self-pay | Admitting: Family Medicine

## 2023-02-24 DIAGNOSIS — I10 Essential (primary) hypertension: Secondary | ICD-10-CM

## 2023-02-28 ENCOUNTER — Ambulatory Visit: Payer: Medicare Other

## 2023-02-28 VITALS — Ht 63.0 in | Wt 201.0 lb

## 2023-02-28 DIAGNOSIS — Z0000 Encounter for general adult medical examination without abnormal findings: Secondary | ICD-10-CM | POA: Diagnosis not present

## 2023-02-28 NOTE — Progress Notes (Signed)
 Subjective:   Jasmine Gregory is a 73 y.o. female who presents for Medicare Annual (Subsequent) preventive examination.  Visit Complete: Virtual I connected with  Jaci Martini on 02/28/23 by a audio enabled telemedicine application and verified that I am speaking with the correct person using two identifiers.  Patient Location: Home  Provider Location: Office/Clinic  I discussed the limitations of evaluation and management by telemedicine. The patient expressed understanding and agreed to proceed.  Vital Signs: Because this visit was a virtual/telehealth visit, some criteria may be missing or patient reported. Any vitals not documented were not able to be obtained and vitals that have been documented are patient reported.  This patient declined Interactive audio and Acupuncturist. Therefore the visit was completed with audio only.  Cardiac Risk Factors include: advanced age (>42men, >6 women);dyslipidemia;hypertension;obesity (BMI >30kg/m2)     Objective:    Today's Vitals   02/28/23 1123  Weight: 201 lb (91.2 kg)  Height: 5\' 3"  (1.6 m)  PainSc: 0-No pain   Body mass index is 35.61 kg/m.     02/28/2023   11:25 AM 09/01/2022    8:33 AM 04/22/2022    8:51 AM 03/22/2022    3:23 PM 02/22/2022    6:18 PM 10/29/2021    3:07 PM 09/23/2021    8:34 AM  Advanced Directives  Does Patient Have a Medical Advance Directive? No No No No No No No  Would patient like information on creating a medical advance directive? No - Patient declined No - Patient declined No - Patient declined No - Patient declined Yes (MAU/Ambulatory/Procedural Areas - Information given) No - Patient declined No - Patient declined    Current Medications (verified) Outpatient Encounter Medications as of 02/28/2023  Medication Sig   acetaminophen  (TYLENOL ) 500 MG tablet Take 1 tablet (500 mg total) by mouth 3 (three) times daily. (Patient taking differently: Take 500 mg by mouth See admin instructions. Take  1 tablet (500 mg) by mouth every morning, may also take 1 tablet (500 mg) every 6 hours as needed for pain)   amLODipine -valsartan  (EXFORGE ) 10-160 MG tablet Take 1 tablet by mouth once daily   aspirin EC 81 MG tablet Take 81 mg by mouth daily. Swallow whole.   cetirizine  (ZYRTEC ) 10 MG tablet Take 1 tablet (10 mg total) by mouth daily as needed. (Patient taking differently: Take 10 mg by mouth daily as needed for allergies (congestion).)   Cholecalciferol (VITAMIN D  PO) Take 1 tablet by mouth daily.   colchicine  0.6 MG tablet Take 1 tablet by mouth once daily (Patient taking differently: Take 0.6 mg by mouth as needed.)   Cyanocobalamin  (VITAMIN B-12 PO) Take 1 tablet by mouth daily.   famotidine  (PEPCID ) 20 MG tablet Take 1 tablet by mouth twice daily   ferrous sulfate 325 (65 FE) MG tablet Take 325 mg by mouth daily with breakfast. (Patient not taking: Reported on 03/22/2022)   gabapentin  (NEURONTIN ) 600 MG tablet Take 1 tablet (600 mg total) by mouth daily as needed (leg pain).   simvastatin  (ZOCOR ) 40 MG tablet TAKE 1 TABLET BY MOUTH AT BEDTIME   Facility-Administered Encounter Medications as of 02/28/2023  Medication   busPIRone  (BUSPAR ) tablet 7.5 mg    Allergies (verified) Mesalamine    History: Past Medical History:  Diagnosis Date   Allergy    Anemia    Anxiety    Arthritis    Benign paroxysmal positional vertigo 01/29/2011   Depression    Diverticulosis  Environmental and seasonal allergies    Finger swelling 09/03/2021   GERD (gastroesophageal reflux disease)    Gout 2023   Hemorrhoids    History of blood transfusion    Hyperlipidemia    Hypertension    Hypovitaminosis D 07/20/2011   Vitamin D  was 17 in May 2012. Patient has not taken any Vitamin D  supplement since that time.      Murmur, heart    Obesity    Rectal bleeding    Vertigo    Vitamin B 12 deficiency 07/20/2011   Vitamin B12 was low at 169 in May 2012. No evidence of supplementation since that time.     Past Surgical History:  Procedure Laterality Date   ABDOMINAL HYSTERECTOMY     Family History  Problem Relation Age of Onset   Melanoma Mother    Breast cancer Maternal Aunt    Colon cancer Maternal Uncle    Prostate cancer Maternal Uncle        x 2   Breast cancer Niece    Esophageal cancer Neg Hx    Pancreatic cancer Neg Hx    Stomach cancer Neg Hx    Liver disease Neg Hx    Rectal cancer Neg Hx    Social History   Socioeconomic History   Marital status: Single    Spouse name: Not on file   Number of children: 1   Years of education: Not on file   Highest education level: Not on file  Occupational History   Occupation: Retired  Tobacco Use   Smoking status: Never    Passive exposure: Never   Smokeless tobacco: Never  Vaping Use   Vaping status: Never Used  Substance and Sexual Activity   Alcohol use: No   Drug use: No   Sexual activity: Not Currently    Birth control/protection: Surgical  Other Topics Concern   Not on file  Social History Narrative   Not on file   Social Drivers of Health   Financial Resource Strain: Low Risk  (02/28/2023)   Overall Financial Resource Strain (CARDIA)    Difficulty of Paying Living Expenses: Not hard at all  Food Insecurity: No Food Insecurity (02/28/2023)   Hunger Vital Sign    Worried About Running Out of Food in the Last Year: Never true    Ran Out of Food in the Last Year: Never true  Transportation Needs: No Transportation Needs (02/28/2023)   PRAPARE - Administrator, Civil Service (Medical): No    Lack of Transportation (Non-Medical): No  Physical Activity: Insufficiently Active (02/28/2023)   Exercise Vital Sign    Days of Exercise per Week: 7 days    Minutes of Exercise per Session: 20 min  Stress: No Stress Concern Present (02/28/2023)   [employer] of Occupational Health - Occupational Stress Questionnaire    Feeling of Stress : Not at all  Social Connections: Socially Isolated (02/28/2023)    Social Connection and Isolation Panel [NHANES]    Frequency of Communication with Friends and Family: More than three times a week    Frequency of Social Gatherings with Friends and Family: Once a week    Attends Religious Services: Never    Database administrator or Organizations: No    Attends Engineer, structural: Never    Marital Status: Divorced    Tobacco Counseling Counseling given: Not Answered   Clinical Intake:  Pre-visit preparation completed: Yes  Pain : No/denies pain Pain Score: 0-No  pain     BMI - recorded: 35.61 Nutritional Status: BMI > 30  Obese Nutritional Risks: None Diabetes: No  How often do you need to have someone help you when you read instructions, pamphlets, or other written materials from your doctor or pharmacy?: 1 - Never What is the last grade level you completed in school?: HSG  Interpreter Needed?: No  Information entered by :: Eldredge Veldhuizen N. Fransheska Willingham, LPN.   Activities of Daily Living    02/28/2023   11:29 AM  In your present state of health, do you have any difficulty performing the following activities:  Hearing? 0  Vision? 0  Difficulty concentrating or making decisions? 0  Walking or climbing stairs? 0  Dressing or bathing? 0  Doing errands, shopping? 0  Preparing Food and eating ? N  Using the Toilet? N  In the past six months, have you accidently leaked urine? N  Do you have problems with loss of bowel control? N  Managing your Medications? N  Managing your Finances? N  Housekeeping or managing your Housekeeping? N    Patient Care Team: [place], MD as PCP - General (Family Medicine) Leath, Dionne, RN as [facility] Care Management  Indicate any recent Medical Services you may have received from other than [facility] providers in the past year (date may be approximate).     Assessment:   This is a routine wellness examination for [place].  Hearing/Vision screen Hearing Screening - Comments::  Denies hearing difficulties. No hearing aids.  Vision Screening - Comments:: Wears rx glasses - up to date with routine eye exams with [facility]    Goals Addressed             This Visit's Progress    My goal is to lose 50 pounds.        Depression Screen    02/28/2023   11:28 AM 11/11/2022    8:50 AM 10/05/2022    3:02 PM 09/01/2022    8:33 AM 04/22/2022    8:51 AM 03/22/2022    3:23 PM 02/22/2022    1:46 PM  PHQ 2/9 Scores  PHQ - 2 Score 0 0 0 0 0 0 0  PHQ- 9 Score 0 0 0 0 0 1     Fall Risk    02/28/2023   11:26 AM 11/11/2022    8:49 AM 10/05/2022    3:02 PM 09/01/2022    8:33 AM 04/22/2022    8:51 AM  Fall Risk   Falls in the past year? 0 0 0 0 0  Number falls in past yr: 0 0 0 0 0  Injury with Fall? 0 0 0 0 0  Risk for fall due to : No Fall Risks   No Fall Risks   Follow up Falls prevention discussed;Falls evaluation completed  Falls evaluation completed Falls prevention discussed Falls evaluation completed    MEDICARE RISK AT HOME: Medicare Risk at Home Any stairs in or around the home?: No If so, are there any without handrails?: No Home free of loose throw rugs in walkways, pet beds, electrical cords, etc?: Yes Adequate lighting in your home to reduce risk of falls?: Yes Life alert?: No (Apple Watch/keeps cell phone near by) Use of a cane, walker or w/c?: No Grab bars in the bathroom?: No (very careful; uses rubber mats) Shower chair or bench in shower?: Yes Elevated toilet seat or a handicapped toilet?: No  TIMED UP AND GO:  Was the test performed?  No    Cognitive Function:    02/28/2023   11:28 AM  MMSE - Mini Mental State Exam  Not completed: Unable to complete        02/28/2023   11:28 AM 02/22/2022    1:48 PM  6CIT Screen  What Year? 0 points 0 points  What month? 0 points 0 points  What time? 0 points 0 points  Count back from 20 0 points 0 points  Months in reverse 0 points 0 points  Repeat phrase 0 points   Total Score 0 points      Immunizations Immunization History  Administered Date(s) Administered   Fluad Quad(high Dose 65+) 11/26/2019, 03/20/2021, 10/29/2021   Influenza Split 12/20/2011   Influenza Whole 10/27/2009   Influenza,inj,Quad PF,6+ Mos 11/13/2015, 11/08/2016, 02/13/2018, 10/27/2018   PFIZER(Purple Top)SARS-COV-2 Vaccination 03/30/2019, 04/23/2019, 01/02/2020   Pneumococcal Conjugate-13 11/13/2015   Pneumococcal Polysaccharide-23 08/04/2017   Td 08/18/2001   Tdap 11/13/2015   Zoster Recombinant(Shingrix ) 03/23/2021, 06/08/2021    TDAP status: Up to date  Flu Vaccine status: Due, Education has been provided regarding the importance of this vaccine. Advised may receive this vaccine at local pharmacy or Health Dept. Aware to provide a copy of the vaccination record if obtained from local pharmacy or Health Dept. Verbalized acceptance and understanding.  Pneumococcal vaccine status: Up to date  Covid-19 vaccine status: Completed vaccines  Qualifies for Shingles Vaccine? Yes   Zostavax completed No   Shingrix  Completed?: Yes  Screening Tests Health Maintenance  Topic Date Due   COVID-19 Vaccine (4 - 2024-25 season) 09/19/2022   INFLUENZA VACCINE  04/18/2023 (Originally 08/19/2022)   MAMMOGRAM  06/21/2023   Medicare Annual Wellness (AWV)  02/28/2024   DTaP/Tdap/Td (3 - Td or Tdap) 11/12/2025   Colonoscopy  12/22/2025   Pneumonia Vaccine 53+ Years old  Completed   DEXA SCAN  Completed   Hepatitis C Screening  Completed   Zoster Vaccines- Shingrix   Completed   HPV VACCINES  Aged Out    Health Maintenance  Health Maintenance Due  Topic Date Due   COVID-19 Vaccine (4 - 2024-25 season) 09/19/2022    Colorectal cancer screening: Type of screening: Colonoscopy. Completed 12/22/2020. Repeat every 5 years  Mammogram status: Completed 12/21/2022. Repeat every year  Bone Density status: Completed 10/22/2016. Results reflect: Bone density results: NORMAL. Repeat every 5 years.  Lung Cancer  Screening: (Low Dose CT Chest recommended if Age 25-80 years, 20 pack-year currently smoking OR have quit w/in 15years.) does not qualify.   Lung Cancer Screening Referral: no  Additional Screening:  Hepatitis C Screening: does qualify; Completed 10/06/2016  Vision Screening: Recommended annual ophthalmology exams for early detection of glaucoma and other disorders of the eye. Is the patient up to date with their annual eye exam?  Yes  Who is the provider or what is the name of the office in which the patient attends annual eye exams? [facility] If pt is not established with a provider, would they like to be referred to a provider to establish care? No .   Dental Screening: Recommended annual dental exams for proper oral hygiene  Community Resource Referral / Chronic Care Management: CRR required this visit?  No   CCM required this visit?  No     Plan:     I have personally reviewed and noted the following in the patient's chart:   Medical and social history Use of alcohol, tobacco or illicit drugs  Current medications and supplements including opioid prescriptions.  Patient is not currently taking opioid prescriptions. Functional ability and status Nutritional status Physical activity Advanced directives List of other physicians Hospitalizations, surgeries, and ER visits in previous 12 months Vitals Screenings to include cognitive, depression, and falls Referrals and appointments  In addition, I have reviewed and discussed with patient certain preventive protocols, quality metrics, and best practice recommendations. A written personalized care plan for preventive services as well as general preventive health recommendations were provided to patient.     Margette Sheldon, LPN   1/61/0960   After Visit Summary: (MyChart) Due to this being a telephonic visit, the after visit summary with patients personalized plan was offered to patient via MyChart   Nurse Notes:  None at this time.

## 2023-02-28 NOTE — Patient Instructions (Addendum)
 Ms. Armas , Thank you for taking time to come for your Medicare Wellness Visit. I appreciate your ongoing commitment to your health goals. Please review the following plan we discussed and let me know if I can assist you in the future.   Referrals/Orders/Follow-Ups/Clinician Recommendations: Yes; Keep maintaining your health by keeping your appointments with Dr. Mauri Sous and any specialists that you may see.  Call us  if you need anything.  Have a great year!!!!  This is a list of the screening recommended for you and due dates:  Health Maintenance  Topic Date Due   COVID-19 Vaccine (4 - 2024-25 season) 09/19/2022   Flu Shot  04/18/2023*   Mammogram  06/21/2023   Medicare Annual Wellness Visit  02/28/2024   DTaP/Tdap/Td vaccine (3 - Td or Tdap) 11/12/2025   Colon Cancer Screening  12/22/2025   Pneumonia Vaccine  Completed   DEXA scan (bone density measurement)  Completed   Hepatitis C Screening  Completed   Zoster (Shingles) Vaccine  Completed   HPV Vaccine  Aged Out  *Topic was postponed. The date shown is not the original due date.    Advanced directives:  Patient is working on Warehouse manager.  Next Medicare Annual Wellness Visit scheduled for next year: Yes

## 2023-03-05 ENCOUNTER — Other Ambulatory Visit: Payer: Self-pay | Admitting: Family Medicine

## 2023-03-05 DIAGNOSIS — E785 Hyperlipidemia, unspecified: Secondary | ICD-10-CM

## 2023-03-15 ENCOUNTER — Other Ambulatory Visit: Payer: Self-pay | Admitting: *Deleted

## 2023-03-15 DIAGNOSIS — E785 Hyperlipidemia, unspecified: Secondary | ICD-10-CM

## 2023-03-21 ENCOUNTER — Ambulatory Visit: Payer: Self-pay

## 2023-03-21 NOTE — Patient Outreach (Signed)
 Care Coordination   Follow Up Visit Note   03/21/2023 Name: Jasmine Gregory MRN: [id number] DOB: 03/05/1950  Jasmine Gregory is a 73 y.o. year old female who sees Lockie Mola, MD for primary care. I spoke with  Jasmine Gregory by phone today.  What matters to the patients health and wellness today?  Maintaining health    Goals Addressed             This Visit's Progress    COMPLETED: Maintain Health       Care Coordination Interventions: Evaluation of current treatment plan related to hypertension self management and patient's adherence to plan as established by provider Reviewed medications with patient and discussed importance of compliance Counseled on the importance of exercise goals with target of 150 minutes per week Advised patient, providing education and rationale, to monitor blood pressure daily and record, calling PCP for findings outside established parameters        Patient reports doing good.  She is  not checking pressure regularly.  Reviewed importance of blood pressure monitoring.  She verbalized understanding.   Discussed closing case as patient maintaining health and no acute needs.  She verbalized understanding.  No concerns.            SDOH assessments and interventions completed:  Yes     Care Coordination Interventions:  Yes, provided   Follow up plan: No further intervention required.   Encounter Outcome:  Patient Visit Completed   Bary Leriche RN, MSN [facility], [facility] RN Care Manager Direct Dial: [phone]  Fax: [phone] Website: [email or web address]

## 2023-03-21 NOTE — Patient Instructions (Signed)
 Visit Information  Thank you for taking time to visit with me today. Please don't hesitate to contact me if I can be of assistance to you.   Following are the goals we discussed today:   Goals Addressed             This Visit's Progress    COMPLETED: Maintain Health       Care Coordination Interventions: Evaluation of current treatment plan related to hypertension self management and patient's adherence to plan as established by provider Reviewed medications with patient and discussed importance of compliance Counseled on the importance of exercise goals with target of 150 minutes per week Advised patient, providing education and rationale, to monitor blood pressure daily and record, calling PCP for findings outside established parameters        Patient reports doing good.  She is  not checking pressure regularly.  Reviewed importance of blood pressure monitoring.  She verbalized understanding.   Discussed closing case as patient maintaining health and no acute needs.  She verbalized understanding.  No concerns.              If you are experiencing a Mental Health or Behavioral Health Crisis or need someone to talk to, please call the Suicide and Crisis Lifeline: 988   Patient verbalizes understanding of instructions and care plan provided today and agrees to view in MyChart. Active MyChart status and patient understanding of how to access instructions and care plan via MyChart confirmed with patient.     No further follow up required: patient meeting goals and no acute needs  Bary Leriche RN, MSN [facility], [facility] RN Care Manager Direct Dial: [phone]  Fax: [phone] Website: [email or web address]

## 2023-04-11 NOTE — Progress Notes (Unsigned)
    SUBJECTIVE:   CHIEF COMPLAINT / HPI:   Hypertension: - Medications: Amlodipine-valsartan 10-160 - Compliance: Yes - Denies any SOB, CP, vision changes, LE edema, medication SEs, or symptoms of hypotension  Epigastric pain History of GERD, takes famotidine 20 mg twice daily with improvement in symptoms.  Does report she has sporadic epigastric pain that will radiate to her back.  Not worse with activity or associated with shortness of breath.  No particular food trigger and can occur before or after meal.  Denies is worse at night.  Denies N/V and diarrhea.   PERTINENT  PMH / PSH: HTN, allergic rhinitis, GERD, HLD  OBJECTIVE:   BP 130/62   Pulse 63   Ht 5\' 3"  (1.6 m)   Wt 208 lb 6 oz (94.5 kg)   SpO2 94%   BMI 36.91 kg/m    General: NAD, pleasant, able to participate in exam Cardiac: RRR, no murmurs. Respiratory: CTAB, normal effort, No wheezes, rales or rhonchi Abdomen: Bowel sounds present, nontender, nondistended Extremities: no edema or cyanosis. Skin: warm and dry, no rashes noted Neuro: alert, no obvious focal deficits Psych: Normal affect and mood  ASSESSMENT/PLAN:   Assessment & Plan HYPERTENSION, BENIGN ESSENTIAL 130/62.  Well-controlled on amlodipine-valsartan combo pill. Normocytic anemia Noted on prior blood work, denies any bleeding.  EGD and colonoscopy. -CBC Prediabetes A1c 5.7, improved from prior.  Continue current lifestyle management. Epigastric pain Most likely secondary to GERD given improvement with famotidine but given multiple risk factors including HTN and SLE concern for underlying cardiac etiology.  Discussed risk benefit of coronary calcium, patient agreeable.  Also recommended returning to GI for follow-up as may need repeat endoscopy. -Cardiac CT for calcium scoring Mixed hyperlipidemia Last lipid panel 02/07/2020, repeat screening today Peripheral polyneuropathy Occasional flare of pain, recommended utilizing gabapentin as needed in  addition to Tylenol.  Can use topical Voltaren gel for symptomatic relief.   Dr. Elberta Fortis, DO [facility]

## 2023-04-12 ENCOUNTER — Encounter: Payer: Self-pay | Admitting: Family Medicine

## 2023-04-12 ENCOUNTER — Ambulatory Visit: Admitting: Family Medicine

## 2023-04-12 VITALS — BP 130/62 | HR 63 | Ht 63.0 in | Wt 208.4 lb

## 2023-04-12 DIAGNOSIS — E782 Mixed hyperlipidemia: Secondary | ICD-10-CM | POA: Diagnosis not present

## 2023-04-12 DIAGNOSIS — I10 Essential (primary) hypertension: Secondary | ICD-10-CM

## 2023-04-12 DIAGNOSIS — R1013 Epigastric pain: Secondary | ICD-10-CM | POA: Diagnosis not present

## 2023-04-12 DIAGNOSIS — D649 Anemia, unspecified: Secondary | ICD-10-CM

## 2023-04-12 DIAGNOSIS — G629 Polyneuropathy, unspecified: Secondary | ICD-10-CM | POA: Diagnosis not present

## 2023-04-12 DIAGNOSIS — R7303 Prediabetes: Secondary | ICD-10-CM

## 2023-04-12 LAB — POCT GLYCOSYLATED HEMOGLOBIN (HGB A1C): HbA1c, POC (prediabetic range): 5.7 % (ref 5.7–6.4)

## 2023-04-12 NOTE — Assessment & Plan Note (Signed)
 130/62.  Well-controlled on amlodipine-valsartan combo pill.

## 2023-04-12 NOTE — Assessment & Plan Note (Signed)
 Occasional flare of pain, recommended utilizing gabapentin as needed in addition to Tylenol.  Can use topical Voltaren gel for symptomatic relief.

## 2023-04-12 NOTE — Patient Instructions (Signed)
 It was wonderful to see you today! Thank you for choosing [facility].   Please bring ALL of your medications with you to every visit.   Today we talked about:  For your leg and arm pain you can take the gabapentin as needed and utilize Voltaren gel that can be found over-the-counter at your local pharmacy for pain relief.  I do agree to not take Aleve or ibuprofen regularly given your difficulties with reflux. I am ordering a CT to quantify the amount of plaque in the vessels around your heart, our office will call you with that scheduling information.  Also checking some lab work to further look at your cholesterol today and will follow-up with those results. Given you are still having reflux even on famotidine twice a day it would be good to go back and see your GI doctor to discuss if you need an endoscopy as persistent symptoms can sometimes need further evaluation in your age group.  Please follow up in 3 months  If you haven't already, sign up for My Chart to have easy access to your labs results, and communication with your primary care physician.   We are checking some labs today. If they are abnormal, I will call you. If they are normal, I will send you a MyChart message (if it is active) or a letter in the mail. If you do not hear about your labs in the next 2 weeks, please call the office.  Call the clinic at [phone] if your symptoms worsen or you have any concerns.  Please be sure to schedule follow up at the front desk before you leave today.   Elberta Fortis, DO Family Medicine

## 2023-04-12 NOTE — Assessment & Plan Note (Signed)
 Last lipid panel 02/07/2020, repeat screening today

## 2023-04-13 ENCOUNTER — Encounter: Payer: Self-pay | Admitting: Family Medicine

## 2023-04-13 LAB — BASIC METABOLIC PANEL
BUN/Creatinine Ratio: 16 (ref 12–28)
BUN: 15 mg/dL (ref 8–27)
CO2: 26 mmol/L (ref 20–29)
Calcium: 10.2 mg/dL (ref 8.7–10.3)
Chloride: 101 mmol/L (ref 96–106)
Creatinine, Ser: 0.95 mg/dL (ref 0.57–1.00)
Glucose: 141 mg/dL — ABNORMAL HIGH (ref 70–99)
Potassium: 4.1 mmol/L (ref 3.5–5.2)
Sodium: 141 mmol/L (ref 134–144)
eGFR: 63 mL/min/{1.73_m2} (ref >59)

## 2023-04-13 LAB — LIPID PANEL
Chol/HDL Ratio: 2.7 ratio (ref 0.0–4.4)
Cholesterol, Total: 165 mg/dL (ref 100–199)
HDL: 62 mg/dL (ref >39)
LDL Chol Calc (NIH): 86 mg/dL (ref 0–99)
Triglycerides: 91 mg/dL (ref 0–149)
VLDL Cholesterol Cal: 17 mg/dL (ref 5–40)

## 2023-04-13 LAB — CBC
Hematocrit: 35.3 % (ref 34.0–46.6)
Hemoglobin: 11 g/dL — ABNORMAL LOW (ref 11.1–15.9)
MCH: 27.2 pg (ref 26.6–33.0)
MCHC: 31.2 g/dL — ABNORMAL LOW (ref 31.5–35.7)
MCV: 87 fL (ref 79–97)
Platelets: 293 10*3/uL (ref 150–450)
RBC: 4.04 x10E6/uL (ref 3.77–5.28)
RDW: 12.5 % (ref 11.7–15.4)
WBC: 5.9 10*3/uL (ref 3.4–10.8)

## 2023-04-14 ENCOUNTER — Other Ambulatory Visit: Payer: Self-pay | Admitting: Family Medicine

## 2023-04-14 DIAGNOSIS — I10 Essential (primary) hypertension: Secondary | ICD-10-CM

## 2023-05-05 ENCOUNTER — Ambulatory Visit
Admission: RE | Admit: 2023-05-05 | Discharge: 2023-05-05 | Disposition: A | Discharge status: Home / Self Care | Source: Ambulatory Visit | Attending: Family Medicine | Admitting: Family Medicine

## 2023-05-05 DIAGNOSIS — E785 Hyperlipidemia, unspecified: Secondary | ICD-10-CM | POA: Diagnosis not present

## 2023-05-05 DIAGNOSIS — R1013 Epigastric pain: Secondary | ICD-10-CM

## 2023-05-16 ENCOUNTER — Telehealth: Payer: Self-pay | Admitting: Family Medicine

## 2023-05-16 DIAGNOSIS — R590 Localized enlarged lymph nodes: Secondary | ICD-10-CM

## 2023-05-16 NOTE — Telephone Encounter (Signed)
 Spoke with patient regarding CT calcium scoring results.  Calcium score 0, low concern GERD like symptoms related to cardiac etiology.  Will continue on simvastatin  40 mg daily.  Incidental finding of slightly enlarged anterior mediastinal lymph node and small pulmonary nodule, will repeat CT scan in 6 months per radiology recommendations.  Patient also mentions she was having vertigo, wondering if she can take meclizine .  Discussed increased risk of falls with meclizine  use and recommended further evaluation in the office if persistent symptoms.  Jonne Netters, DO

## 2023-05-16 NOTE — Telephone Encounter (Signed)
 Patient contacted.   Discussed recommendations with patient.

## 2023-05-16 NOTE — Telephone Encounter (Signed)
 Patient calls nurse line in regards to recent conversation with provider.   She reports since the test showed low concern for GERD, should be still taking be taking Famotidine ?   Advised will forward to provider.

## 2023-06-10 ENCOUNTER — Other Ambulatory Visit: Payer: Self-pay | Admitting: *Deleted

## 2023-06-10 DIAGNOSIS — E785 Hyperlipidemia, unspecified: Secondary | ICD-10-CM

## 2023-06-10 MED ORDER — SIMVASTATIN 40 MG PO TABS
40.0000 mg | ORAL_TABLET | Freq: Every day | ORAL | 3 refills | Status: AC
Start: 2023-06-10 — End: ?

## 2023-07-27 IMAGING — CT CT ABD-PELV W/O CM
2 of 4 series · 17 of 46 positions shown, 19 images · non-contrast
Comparison: None.

CLINICAL DATA: 70-year-old female with acute LEFT abdominal and
pelvic pain.

EXAM:
CT ABDOMEN AND PELVIS WITHOUT CONTRAST
TECHNIQUE: Multidetector CT imaging of the abdomen and pelvis was performed
following the standard protocol without IV contrast.

[Series 3: abd/ pelvis 5.0 i30f 2 · axial · 0.83mm/px · z∈[+888,+1293]mm · 14 of 89 slices shown, 16 images]
[im 4/89  soft-tissue]
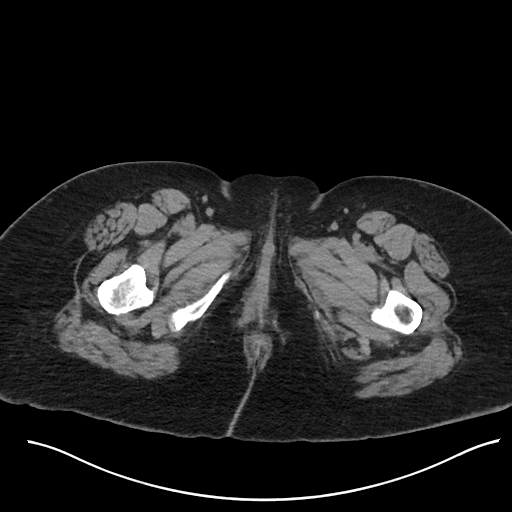
[im 4/89  bone]
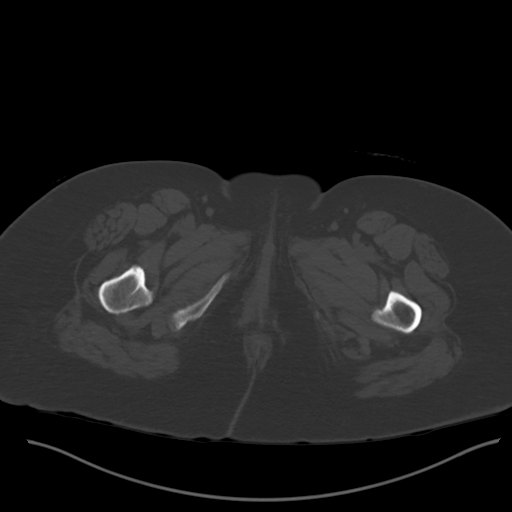
[im 12/89  soft-tissue]
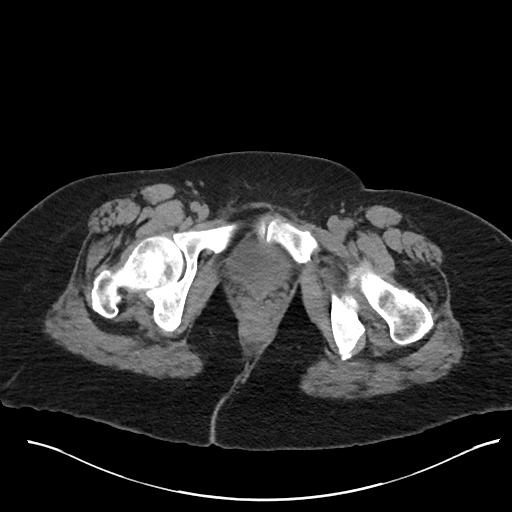
[im 19/89  soft-tissue]
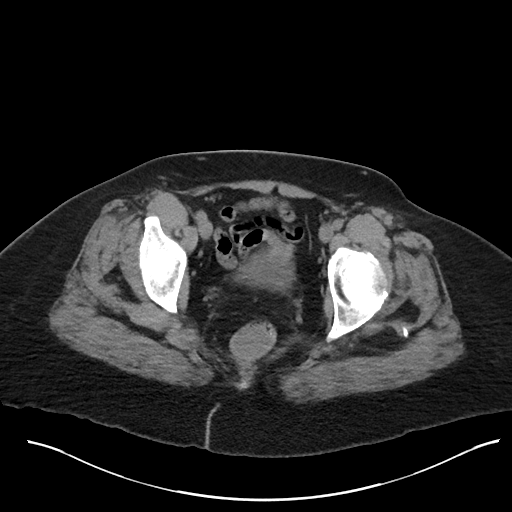
[im 23/89  soft-tissue]
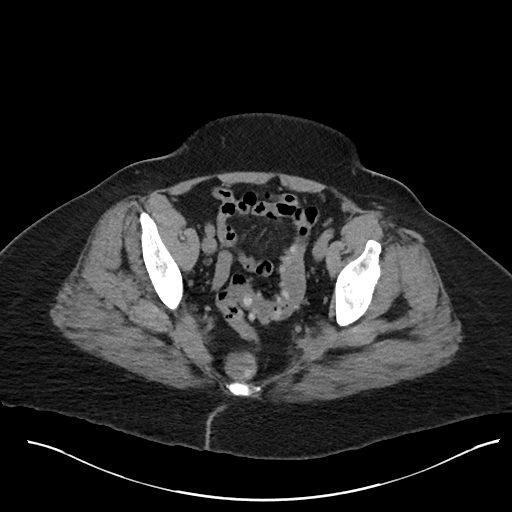
[im 30/89  soft-tissue]
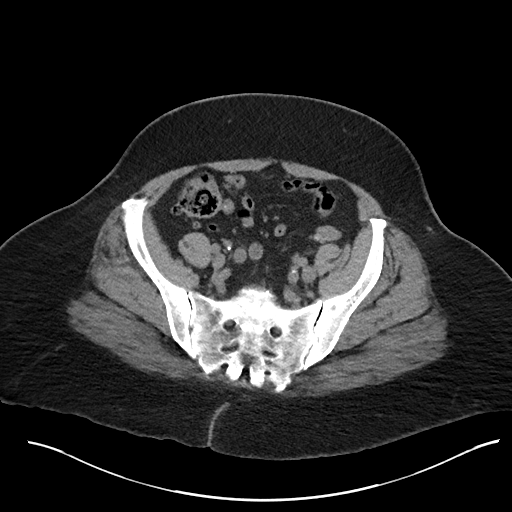
[im 37/89  soft-tissue]
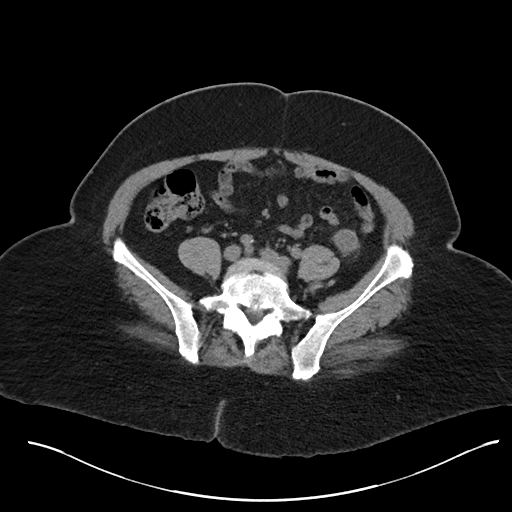
[im 41/89  soft-tissue]
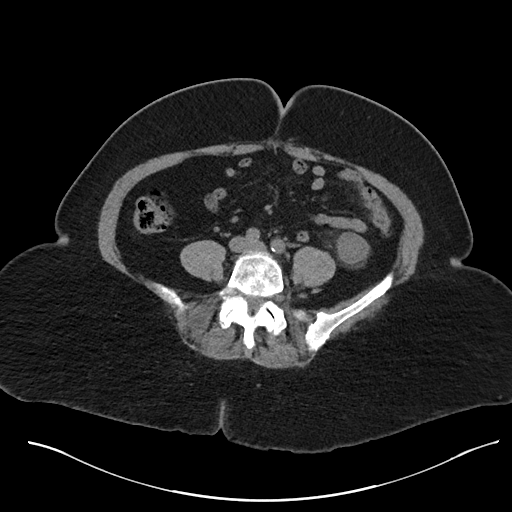
[im 48/89  soft-tissue]
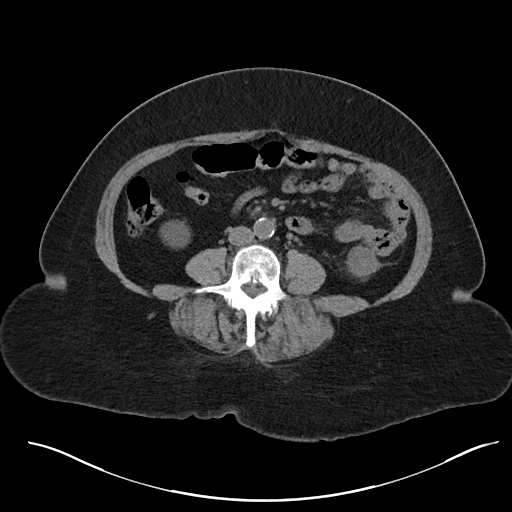
[im 52/89  soft-tissue]
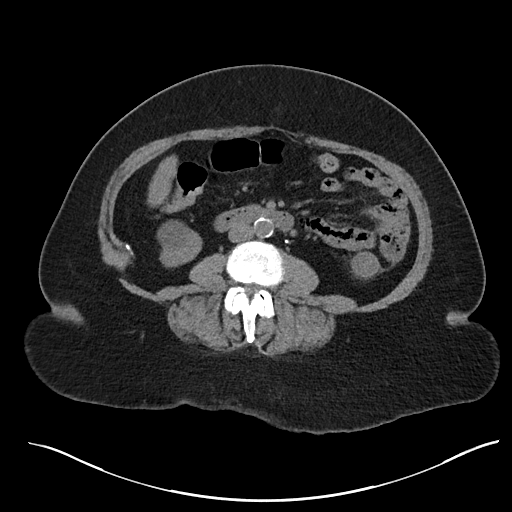
[im 52/89  bone]
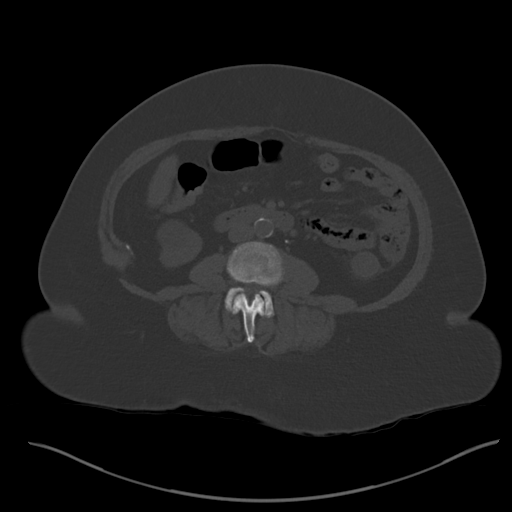
[im 59/89  soft-tissue]
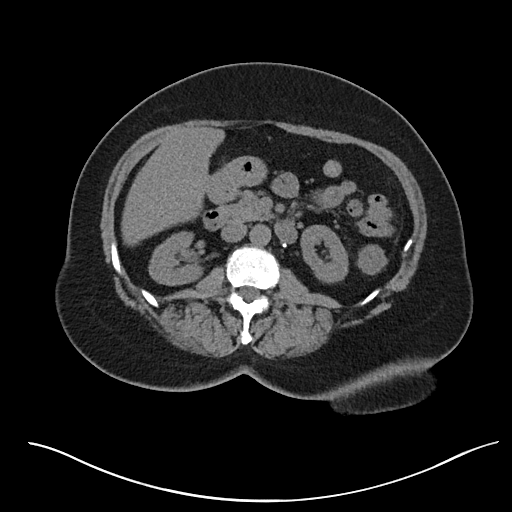
[im 67/89  soft-tissue]
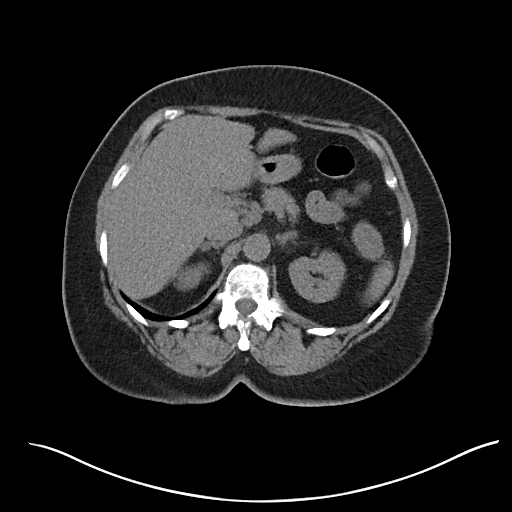
[im 70/89  soft-tissue]
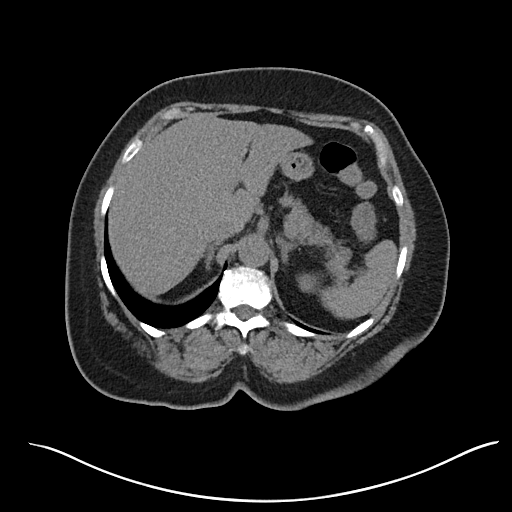
[im 78/89  soft-tissue]
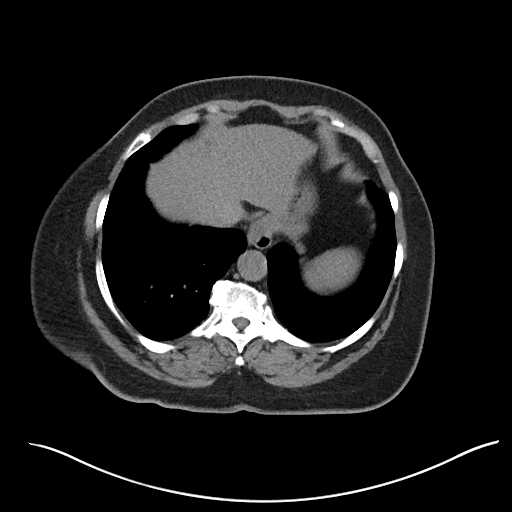
[im 85/89  soft-tissue]
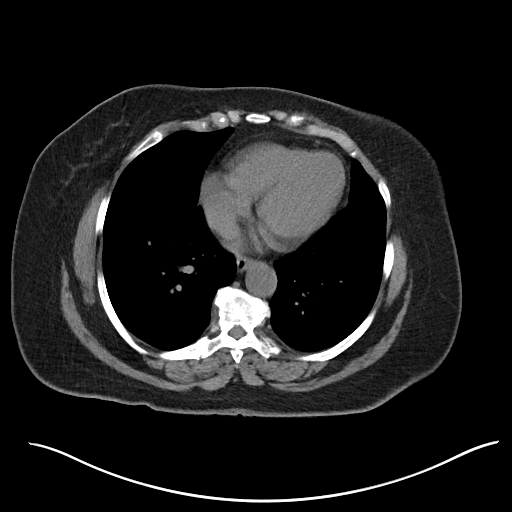

[Series 6: cor st · coronal · 0.84mm/px · 3 of 103 slices shown]
[im 35/103  soft-tissue]
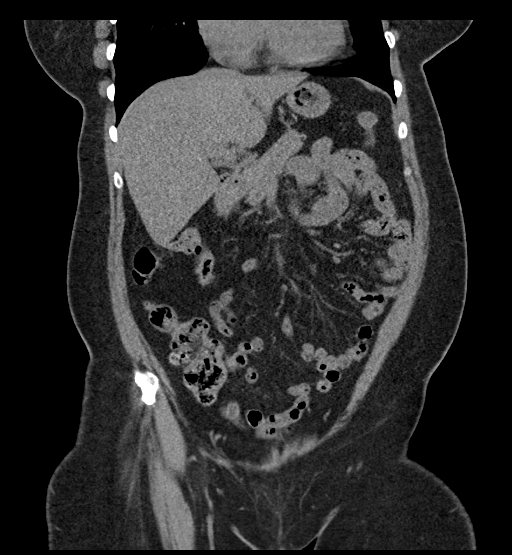
[im 46/103  soft-tissue]
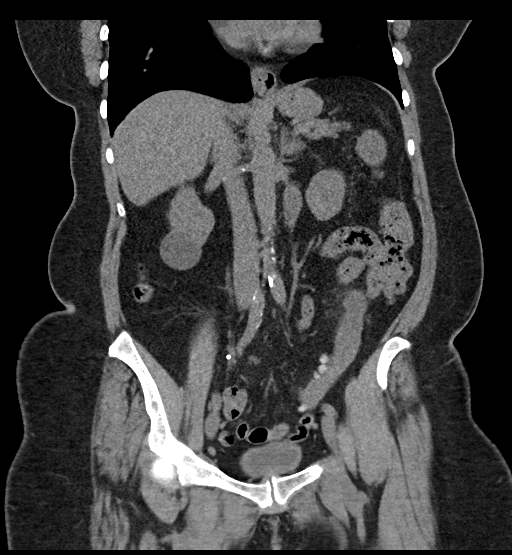
[im 57/103  soft-tissue]
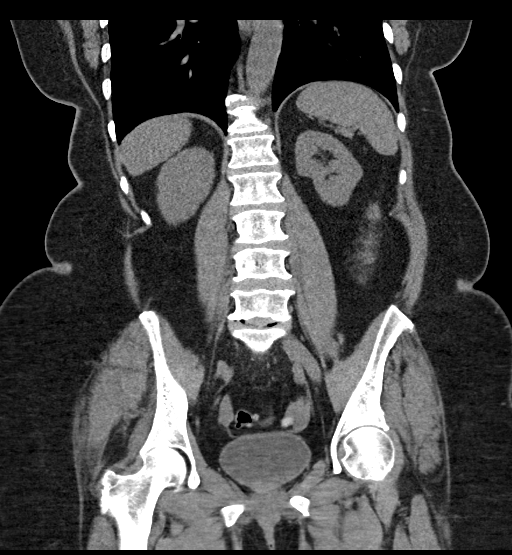

[17 of 46 positions shown; findings below may reference images not displayed]

FINDINGS: Please note that parenchymal abnormalities may be missed without
intravenous contrast.

Lower chest: No acute abnormality

Hepatobiliary: Mild hepatic steatosis noted. No other liver or
gallbladder abnormality identified. No biliary dilatation.

Pancreas: Unremarkable

Spleen: Unremarkable

Adrenals/Urinary Tract: The kidneys, adrenal glands and bladder are
unremarkable except for a RIGHT renal cyst.

Stomach/Bowel: Diffuse circumferential wall thickening of the
descending colon is noted with mild adjacent inflammation. Scattered
diverticula noted throughout the sigmoid colon. There is no evidence
of bowel obstruction, pneumoperitoneum or focal collection. The
appendix is normal. A small hiatal hernia is identified.

Vascular/Lymphatic: Aortic atherosclerosis. No enlarged abdominal or
pelvic lymph nodes.

Reproductive: Status post hysterectomy. No adnexal masses.

Other: No ascites.

Musculoskeletal: No acute abnormality or suspicious focal bony
lesions. Degenerative disc disease and facet arthropathy throughout
the lumbar spine identified.
IMPRESSION: 1. Diffuse circumferential wall thickening of the descending colon
with mild adjacent inflammation compatible with colitis. No evidence
of bowel obstruction, pneumoperitoneum or focal collection.
2. Mild hepatic steatosis.
3. Small hiatal hernia.
4. Aortic Atherosclerosis (TYP2C-SDQ.Q).

## 2023-08-17 DIAGNOSIS — R700 Elevated erythrocyte sedimentation rate: Secondary | ICD-10-CM | POA: Diagnosis not present

## 2023-08-17 DIAGNOSIS — M109 Gout, unspecified: Secondary | ICD-10-CM | POA: Diagnosis not present

## 2023-08-17 DIAGNOSIS — M25562 Pain in left knee: Secondary | ICD-10-CM | POA: Diagnosis not present

## 2023-08-17 DIAGNOSIS — M3500 Sicca syndrome, unspecified: Secondary | ICD-10-CM | POA: Diagnosis not present

## 2023-08-17 DIAGNOSIS — D649 Anemia, unspecified: Secondary | ICD-10-CM | POA: Diagnosis not present

## 2023-08-17 DIAGNOSIS — M1990 Unspecified osteoarthritis, unspecified site: Secondary | ICD-10-CM | POA: Diagnosis not present

## 2023-08-17 DIAGNOSIS — E669 Obesity, unspecified: Secondary | ICD-10-CM | POA: Diagnosis not present

## 2023-08-17 DIAGNOSIS — L932 Other local lupus erythematosus: Secondary | ICD-10-CM | POA: Diagnosis not present

## 2023-10-28 ENCOUNTER — Ambulatory Visit: Attending: Family Medicine

## 2023-10-28 ENCOUNTER — Encounter: Payer: Self-pay | Admitting: Family Medicine

## 2023-10-28 ENCOUNTER — Ambulatory Visit (INDEPENDENT_AMBULATORY_CARE_PROVIDER_SITE_OTHER): Admitting: Family Medicine

## 2023-10-28 VITALS — BP 123/43 | HR 88 | Temp 98.1°F | Ht 65.0 in | Wt 208.4 lb

## 2023-10-28 DIAGNOSIS — M7989 Other specified soft tissue disorders: Secondary | ICD-10-CM

## 2023-10-28 DIAGNOSIS — R42 Dizziness and giddiness: Secondary | ICD-10-CM

## 2023-10-28 DIAGNOSIS — D510 Vitamin B12 deficiency anemia due to intrinsic factor deficiency: Secondary | ICD-10-CM

## 2023-10-28 DIAGNOSIS — M10072 Idiopathic gout, left ankle and foot: Secondary | ICD-10-CM

## 2023-10-28 DIAGNOSIS — I10 Essential (primary) hypertension: Secondary | ICD-10-CM | POA: Diagnosis not present

## 2023-10-28 DIAGNOSIS — H6123 Impacted cerumen, bilateral: Secondary | ICD-10-CM

## 2023-10-28 DIAGNOSIS — R0981 Nasal congestion: Secondary | ICD-10-CM | POA: Diagnosis present

## 2023-10-28 MED ORDER — FLUTICASONE PROPIONATE 50 MCG/ACT NA SUSP
2.0000 | Freq: Every day | NASAL | 6 refills | Status: AC
Start: 2023-10-28 — End: ?

## 2023-10-28 MED ORDER — COLCHICINE 0.6 MG PO TABS
ORAL_TABLET | ORAL | 0 refills | Status: AC
Start: 2023-10-28 — End: ?

## 2023-10-28 MED ORDER — DEBROX 6.5 % OT SOLN
5.0000 [drp] | Freq: Two times a day (BID) | OTIC | 0 refills | Status: AC
Start: 2023-10-28 — End: ?

## 2023-10-28 NOTE — Progress Notes (Unsigned)
 EP to read.

## 2023-10-28 NOTE — Patient Instructions (Signed)
 It was wonderful to see you today.  Please bring ALL of your medications with you to every visit.   Today we talked about:  Sinus pressure - You have some sinus congestion that is most likely due to allergies. Try taking the flonase  nasal spray in addition to the zyrtec . You can also try saline nasal spray in addition or a saline rinse like a neti pot.  I have also sent debrox drops to help clean out your ears.   For your vertigo it is most likely flared due to the sinus issues. Continue your vestibular rehab exericses.   Your odd dizziness/sensation episodes are most likely benign, but I have ordered a zio patch to check on your heart rhythm this should arrive at your house. If you get any additional symptoms please let us  know and we can do a further workup.   For your toe pain, I have sent in the colchicine  prescription. If you feel the flare is starting you can take the medicine.  Please follow up as needed   Thank you for choosing [facility].   Please call [phone] with any questions about today's appointment.  Please be sure to schedule follow up at the front desk before you leave today.   Areta Saliva, MD  Family Medicine

## 2023-10-28 NOTE — Progress Notes (Signed)
    SUBJECTIVE:   CHIEF COMPLAINT / HPI:   Discussed the use of AI scribe software for clinical note transcription with the patient, who gave verbal consent to proceed.  History of Present Illness Jasmine Gregory is a 73 year old female who presents with dizziness and sinus pressure.  Sinus pressure She has been experiencing sinus issues for the past three to four weeks, characterized by pressure across her forehead. She feels pressure on her forehead and around her eyes. No blurry vision is noted, but there is some nasal and chest congestion, particularly in the mornings. She has been using Tylenol  and Zyrtec  without relief. She is unsure if she has had a fever because she feels hot all the time, but she frequently feels warm.  Vertigo  She has a history of vertigo, which has worsened over the past couple of months. The vertigo episodes are triggered by head movements and riding in cars over bumpy roads. She has previously attended vestibular rehab and learned exercises to manage her symptoms.   Abnormal sensation  She describes episodes of sudden dizziness where she feels the need to grab onto something to stabilize herself. These episodes occur once or twice a week and last a few minutes. No palpitations or shortness of breath are reported during these episodes.  Gout She reports a recent onset of pain in her big toe, suspecting a recurrence of gout. She has previously used colchicine  for gout flares, which was effective. The toe pain began this week, and she is concerned about it worsening.    PERTINENT  PMH / PSH: BPPV.  OBJECTIVE:   BP (!) 152/71   Pulse 96   Temp 98.1 F (36.7 C) (Oral)   Ht 5' 5 (1.651 m)   Wt 208 lb 6.4 oz (94.5 kg)   SpO2 99%   BMI 34.68 kg/m   ***  ASSESSMENT/PLAN:   Assessment & Plan Dizziness  Sinus congestion  Finger swelling  Bilateral impacted cerumen  Pernicious anemia    Assessment and Plan Assessment & Plan Allergic rhinitis  with chronic sinus congestion Chronic sinus congestion likely due to allergies and seasonal changes, contributing to eustachian tube dysfunction and vertigo. - Recommend Flonase  nasal spray, instruct to point to the outside of the nose. - Advise saline nasal spray or rinse to clear sinuses.  Benign paroxysmal positional vertigo with multisensory deficit Chronic vertigo worsened by sinus congestion and eustachian tube dysfunction. Multisensory deficit from age and peripheral neuropathy affects balance. - Perform Epley maneuver. - Continue vertigo exercises from vestibular rehabilitation. - Order Zio patch to monitor heart rhythm for one week.  Peripheral neuropathy Contributes to multisensory deficit and balance issues.  Gout flare, right great toe (suspected) Suspected gout flare with pain, history of gout, and previous response to colchicine . - Prescribe colchicine  for gout flare. - Advise to take colchicine  with food to minimize stomach upset. - Instruct to take colchicine  if symptoms worsen, such as increased pain, redness, or swelling.  Cerumen impaction, bilateral (suspected) Suspected cerumen impaction hindering ear assessment. - Recommend Debrox drops for ear wax removal. - Advise against using Q-tips.    Areta Saliva, MD [facility]

## 2023-10-29 ENCOUNTER — Ambulatory Visit: Payer: Self-pay | Admitting: Family Medicine

## 2023-10-29 LAB — CBC WITH DIFFERENTIAL/PLATELET
Basophils Absolute: 0 x10E3/uL (ref 0.0–0.2)
Basos: 0 %
EOS (ABSOLUTE): 0.1 x10E3/uL (ref 0.0–0.4)
Eos: 2 %
Hematocrit: 34.8 % (ref 34.0–46.6)
Hemoglobin: 10.8 g/dL — ABNORMAL LOW (ref 11.1–15.9)
Immature Grans (Abs): 0 x10E3/uL (ref 0.0–0.1)
Immature Granulocytes: 0 %
Lymphocytes Absolute: 1.3 x10E3/uL (ref 0.7–3.1)
Lymphs: 22 %
MCH: 27.4 pg (ref 26.6–33.0)
MCHC: 31 g/dL — ABNORMAL LOW (ref 31.5–35.7)
MCV: 88 fL (ref 79–97)
Monocytes Absolute: 0.6 x10E3/uL (ref 0.1–0.9)
Monocytes: 10 %
Neutrophils Absolute: 3.9 x10E3/uL (ref 1.4–7.0)
Neutrophils: 66 %
Platelets: 324 x10E3/uL (ref 150–450)
RBC: 3.94 x10E6/uL (ref 3.77–5.28)
RDW: 13.5 % (ref 11.7–15.4)
WBC: 5.9 x10E3/uL (ref 3.4–10.8)

## 2023-10-29 LAB — IRON AND TIBC
Iron Saturation: 21 % (ref 15–55)
Iron: 52 ug/dL (ref 27–139)
Total Iron Binding Capacity: 249 ug/dL — ABNORMAL LOW (ref 250–450)
UIBC: 197 ug/dL (ref 118–369)

## 2023-10-29 LAB — FERRITIN: Ferritin: 65 ng/mL (ref 15–150)

## 2023-10-29 LAB — VITAMIN B12: Vitamin B-12: 322 pg/mL (ref 232–1245)

## 2023-10-30 NOTE — Assessment & Plan Note (Signed)
 Well controlled with current medication regimen. Checked blood pressure standing given wide pulse pressure, patient's age, and dizziness.  - Continue amlodipine -valsartan  10-160

## 2023-11-21 ENCOUNTER — Ambulatory Visit
Admission: RE | Admit: 2023-11-21 | Discharge: 2023-11-21 | Disposition: A | Discharge status: Home / Self Care | Source: Ambulatory Visit | Attending: Family Medicine | Admitting: Family Medicine

## 2023-11-21 DIAGNOSIS — J984 Other disorders of lung: Secondary | ICD-10-CM | POA: Diagnosis not present

## 2023-11-21 DIAGNOSIS — R590 Localized enlarged lymph nodes: Secondary | ICD-10-CM

## 2023-11-30 ENCOUNTER — Ambulatory Visit: Payer: Self-pay | Admitting: Family Medicine

## 2023-11-30 DIAGNOSIS — K219 Gastro-esophageal reflux disease without esophagitis: Secondary | ICD-10-CM

## 2023-11-30 NOTE — Telephone Encounter (Signed)
 Spoke with [facility] imaging radiology line about clarification of CT chest without contrast imaging results with specified nodular density in the anterior mediastinum.  Requested clarification if this is most consistent with lymphadenopathy seen on prior imaging and if so would CT chest with contrast or MRI to better elucidate these findings.  Request to be forwarded to Dr. Shelia who read the initial image and follow-up with addendum.

## 2023-12-10 DIAGNOSIS — R42 Dizziness and giddiness: Secondary | ICD-10-CM | POA: Diagnosis not present

## 2023-12-12 DIAGNOSIS — R42 Dizziness and giddiness: Secondary | ICD-10-CM | POA: Diagnosis not present

## 2023-12-19 MED ORDER — FAMOTIDINE 20 MG PO TABS: 20.0000 mg | ORAL_TABLET | Freq: Every day | ORAL | 1 refills | Status: AC

## 2023-12-19 NOTE — Addendum Note (Signed)
 Addended by: THEOPHILUS PAGAN on: 12/19/2023 03:17 PM   Modules accepted: Orders

## 2023-12-19 NOTE — Telephone Encounter (Signed)
 Spoke with patient regarding CT chest results which showed nonspecific anterior mediastinal lymphadenopathy and multiple small pulmonary nodules.  Patient currently asymptomatic without any shortness of breath.  Discussed lung changes could be early signs of sarcoidosis but given nonspecific changes could just be incidental finding.  Will recommend repeat CT chest in 11/2024 as specified by radiology.  Patient requesting refill on famotidine  during phone call, only taking 20 mg once daily as needed.  Refill provided to preferred pharmacy.  Izetta Nap, DO

## 2024-03-27 IMAGING — CR DG FOOT COMPLETE 3+V*L*
3 series · 3 of 3 positions shown · non-contrast
Comparison: None Available.

CLINICAL DATA: Left great toe pain and swelling for 2-3 weeks. No
known recent trauma.

EXAM:
LEFT FOOT - COMPLETE 3+ VIEW

[foot ap]
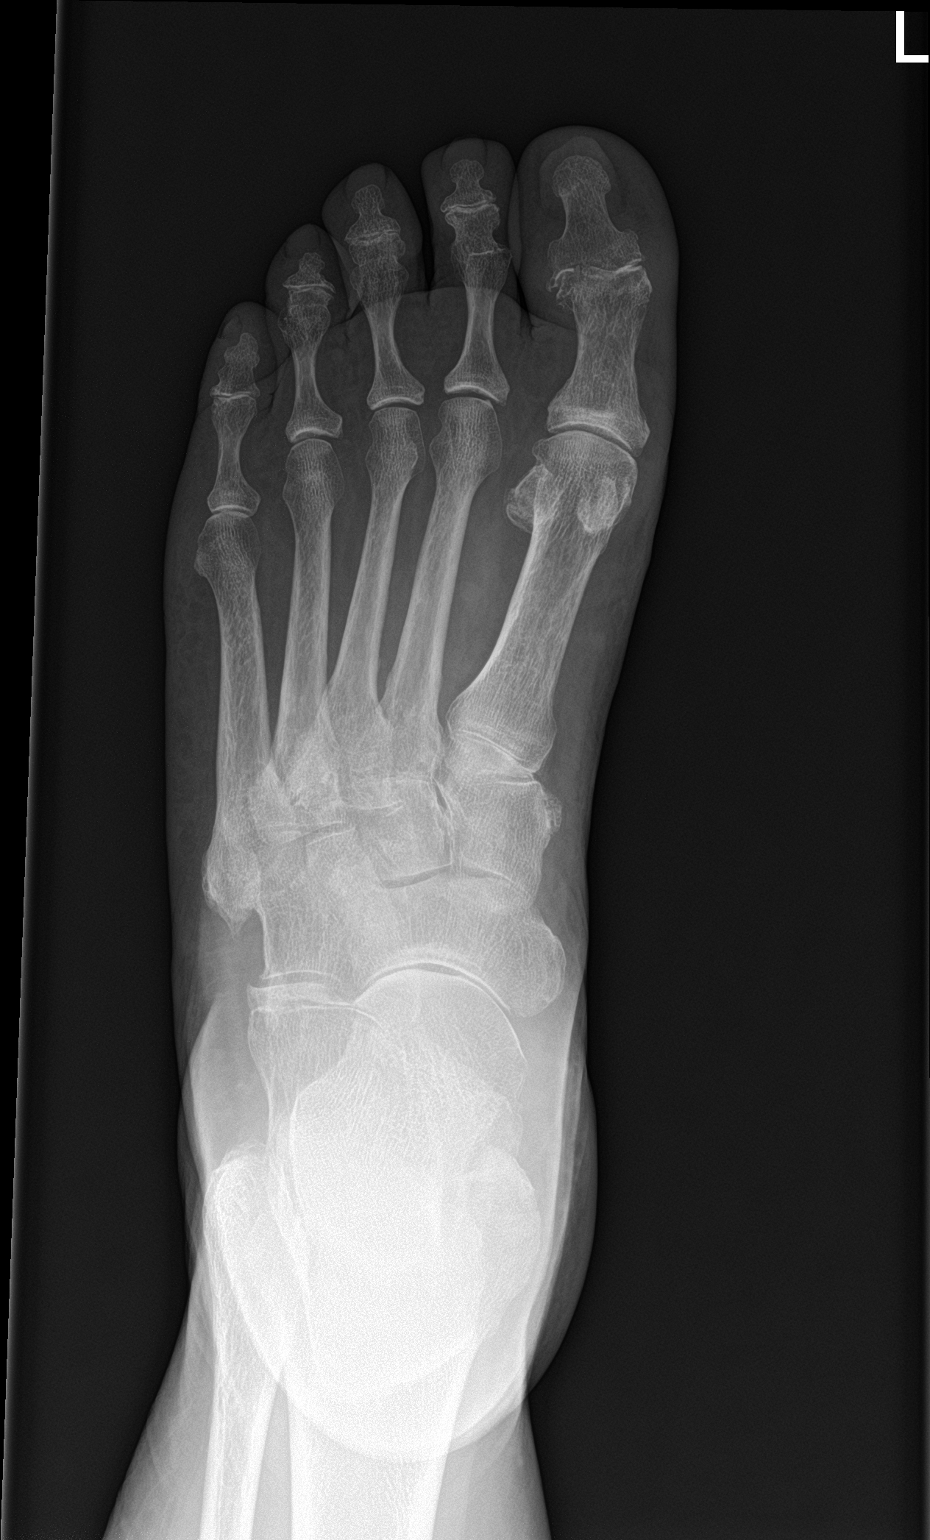

[foot obl]
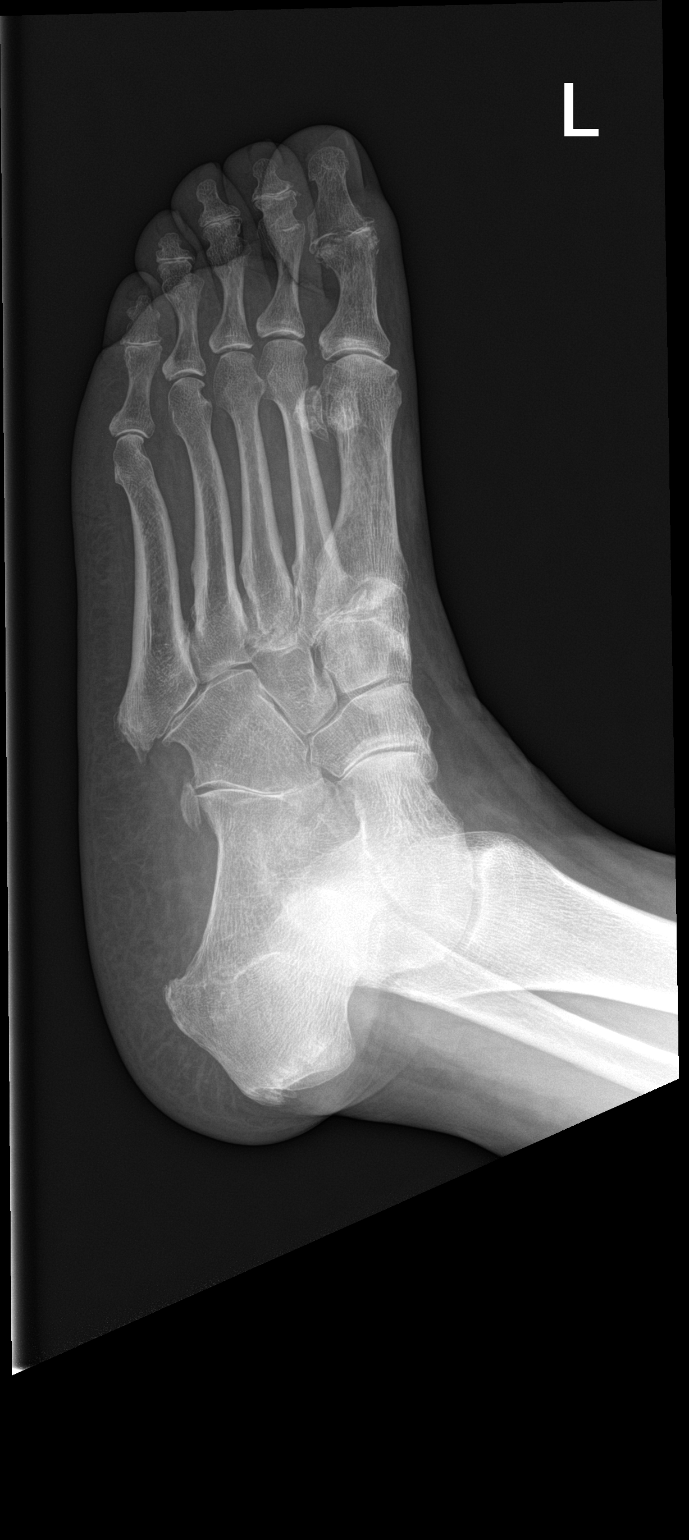

[foot lat]
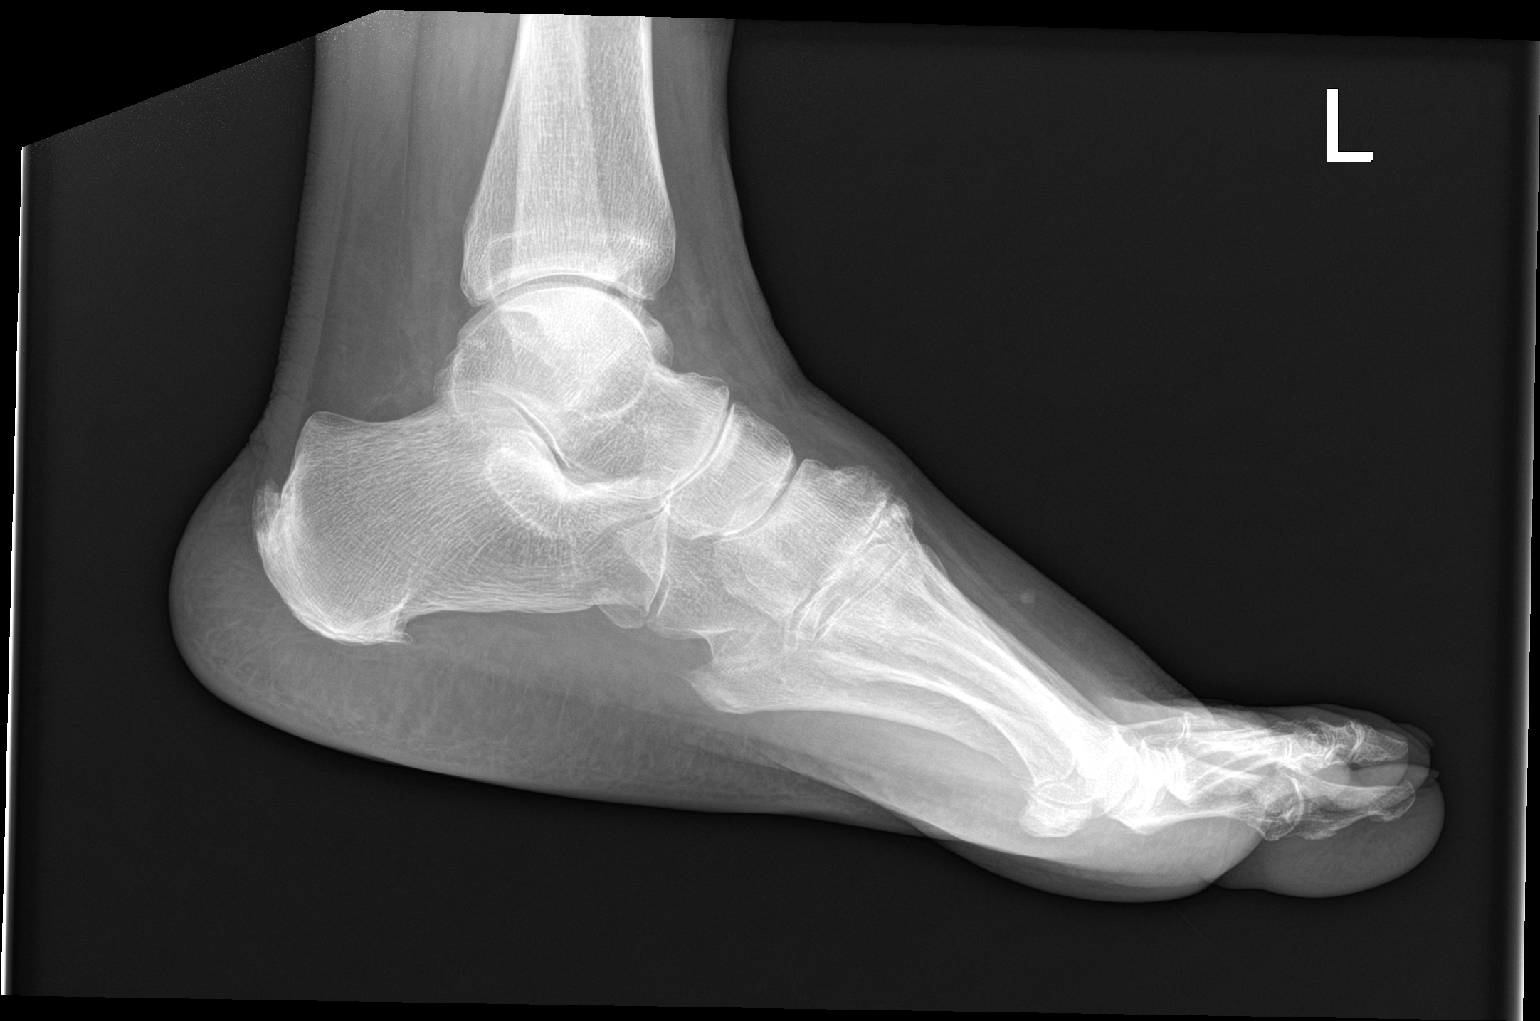

[3 of 3 positions shown; findings below may reference images not displayed]

FINDINGS: There is diffuse decreased bone mineralization. Mild-to-moderate
great toe soft tissue swelling. Severe great toe interphalangeal
joint space narrowing with moderate lateral degenerative
osteophytosis. Mild subchondral lucencies especially at the lateral
aspect of the great toe interphalangeal joint may represent
degenerative subchondral cysts versus mild erosions.

Moderate joint space narrowing of the other interphalangeal joints
diffusely.

Mild great toe metatarsophalangeal joint space narrowing with mild
lateral degenerative osteophytes.

Moderate first through third, mild-to-moderate fifth and mild fourth
tarsometatarsal joint space narrowing. Moderate dorsal
tarsometatarsal osteophytes on lateral view.

Small plantar calcaneal heel spur.

Mild chronic spurring at the Achilles insertion on the calcaneus.

No acute fracture or dislocation.
IMPRESSION: 1. Severe joint space narrowing with moderate peripheral
osteophytosis of the lateral greater than medial aspect of the great
toe interphalangeal joint. There are also subchondral lucencies at
the lateral aspect of the interphalangeal joint. These may represent
degenerative subchondral cysts of osteoarthritis. Mild erosions of
an inflammatory arthropathy such as gout can have a similar
appearance.
2. Additional osteoarthritis of the interphalangeal joints and
tarsometatarsal joints as above.
